# Patient Record
Sex: Male | Born: 1980
Health system: Southern US, Community
[De-identification: ages and names within clinical notes are randomized; demographics above are authoritative.]

## PROBLEM LIST (undated history)

## (undated) DIAGNOSIS — I10 Essential (primary) hypertension: Secondary | ICD-10-CM

## (undated) DIAGNOSIS — F17200 Nicotine dependence, unspecified, uncomplicated: Secondary | ICD-10-CM

## (undated) DIAGNOSIS — J45909 Unspecified asthma, uncomplicated: Secondary | ICD-10-CM

## (undated) DIAGNOSIS — F319 Bipolar disorder, unspecified: Secondary | ICD-10-CM

## (undated) DIAGNOSIS — I779 Disorder of arteries and arterioles, unspecified: Secondary | ICD-10-CM

## (undated) DIAGNOSIS — F209 Schizophrenia, unspecified: Secondary | ICD-10-CM

## (undated) DIAGNOSIS — J449 Chronic obstructive pulmonary disease, unspecified: Secondary | ICD-10-CM

## (undated) DIAGNOSIS — G510 Bell's palsy: Secondary | ICD-10-CM

## (undated) DIAGNOSIS — I739 Peripheral vascular disease, unspecified: Secondary | ICD-10-CM

## (undated) DIAGNOSIS — K589 Irritable bowel syndrome without diarrhea: Secondary | ICD-10-CM

## (undated) HISTORY — PX: ABDOMINAL SURGERY: SHX537

## (undated) HISTORY — PX: CHOLECYSTECTOMY: SHX55

---

## 2006-01-15 ENCOUNTER — Inpatient Hospital Stay (HOSPITAL_COMMUNITY): Admission: EM | Admit: 2006-01-15 | Discharge: 2006-01-16 | Payer: Self-pay | Admitting: *Deleted

## 2006-01-16 ENCOUNTER — Ambulatory Visit: Payer: Self-pay | Admitting: *Deleted

## 2006-02-27 ENCOUNTER — Emergency Department (HOSPITAL_COMMUNITY): Admission: EM | Admit: 2006-02-27 | Discharge: 2006-02-27 | Payer: Self-pay | Admitting: Emergency Medicine

## 2011-02-06 ENCOUNTER — Emergency Department (HOSPITAL_COMMUNITY): Payer: Self-pay

## 2011-02-06 ENCOUNTER — Emergency Department (HOSPITAL_COMMUNITY)
Admission: EM | Admit: 2011-02-06 | Discharge: 2011-02-07 | Disposition: A | Discharge status: Home / Self Care | Payer: Self-pay | Attending: Emergency Medicine | Admitting: Emergency Medicine

## 2011-02-06 DIAGNOSIS — R29810 Facial weakness: Secondary | ICD-10-CM | POA: Insufficient documentation

## 2011-02-06 DIAGNOSIS — I10 Essential (primary) hypertension: Secondary | ICD-10-CM | POA: Insufficient documentation

## 2011-02-06 DIAGNOSIS — R209 Unspecified disturbances of skin sensation: Secondary | ICD-10-CM | POA: Insufficient documentation

## 2011-02-06 DIAGNOSIS — R0789 Other chest pain: Secondary | ICD-10-CM | POA: Insufficient documentation

## 2011-02-06 DIAGNOSIS — K589 Irritable bowel syndrome without diarrhea: Secondary | ICD-10-CM | POA: Insufficient documentation

## 2011-02-06 DIAGNOSIS — J4489 Other specified chronic obstructive pulmonary disease: Secondary | ICD-10-CM | POA: Insufficient documentation

## 2011-02-06 DIAGNOSIS — R4789 Other speech disturbances: Secondary | ICD-10-CM | POA: Insufficient documentation

## 2011-02-06 DIAGNOSIS — R51 Headache: Secondary | ICD-10-CM | POA: Insufficient documentation

## 2011-02-06 DIAGNOSIS — J449 Chronic obstructive pulmonary disease, unspecified: Secondary | ICD-10-CM | POA: Insufficient documentation

## 2011-02-07 ENCOUNTER — Emergency Department (HOSPITAL_COMMUNITY): Payer: Self-pay

## 2011-02-07 ENCOUNTER — Emergency Department (HOSPITAL_COMMUNITY)
Admission: EM | Admit: 2011-02-07 | Discharge: 2011-02-08 | Disposition: A | Discharge status: Home / Self Care | Payer: Self-pay | Attending: Emergency Medicine | Admitting: Emergency Medicine

## 2011-02-07 DIAGNOSIS — Z79899 Other long term (current) drug therapy: Secondary | ICD-10-CM | POA: Insufficient documentation

## 2011-02-07 DIAGNOSIS — F17200 Nicotine dependence, unspecified, uncomplicated: Secondary | ICD-10-CM | POA: Insufficient documentation

## 2011-02-07 DIAGNOSIS — R61 Generalized hyperhidrosis: Secondary | ICD-10-CM | POA: Insufficient documentation

## 2011-02-07 DIAGNOSIS — I10 Essential (primary) hypertension: Secondary | ICD-10-CM | POA: Insufficient documentation

## 2011-02-07 DIAGNOSIS — R079 Chest pain, unspecified: Secondary | ICD-10-CM | POA: Insufficient documentation

## 2011-02-07 DIAGNOSIS — R110 Nausea: Secondary | ICD-10-CM | POA: Insufficient documentation

## 2011-02-07 DIAGNOSIS — R0602 Shortness of breath: Secondary | ICD-10-CM | POA: Insufficient documentation

## 2011-02-07 DIAGNOSIS — R0989 Other specified symptoms and signs involving the circulatory and respiratory systems: Secondary | ICD-10-CM | POA: Insufficient documentation

## 2011-02-07 DIAGNOSIS — R0609 Other forms of dyspnea: Secondary | ICD-10-CM | POA: Insufficient documentation

## 2011-02-07 LAB — CK TOTAL AND CKMB (NOT AT ARMC)
CK, MB: 2.2 ng/mL (ref 0.3–4.0)
CK, MB: 2.1 ng/mL (ref 0.3–4.0)
CK, MB: 2 ng/mL (ref 0.3–4.0)
CK, MB: 1.7 ng/mL (ref 0.3–4.0)
Relative Index: 1 (ref 0.0–2.5)
Relative Index: 1 (ref 0.0–2.5)
Total CK: 149 U/L (ref 7–232)

## 2011-02-07 LAB — CBC
HCT: 43.1 % (ref 39.0–52.0)
HCT: 43 % (ref 39.0–52.0)
Hemoglobin: 15.5 g/dL (ref 13.0–17.0)
MCV: 83.2 fL (ref 78.0–100.0)
Platelets: 256 10*3/uL (ref 150–400)
RBC: 5.18 MIL/uL (ref 4.22–5.81)
RBC: 5.16 MIL/uL (ref 4.22–5.81)
RDW: 12.2 % (ref 11.5–15.5)
RDW: 12.1 % (ref 11.5–15.5)
WBC: 7.9 10*3/uL (ref 4.0–10.5)
WBC: 6.4 10*3/uL (ref 4.0–10.5)

## 2011-02-07 LAB — BASIC METABOLIC PANEL
BUN: 10 mg/dL (ref 6–23)
CO2: 23 mEq/L (ref 19–32)
Calcium: 8.8 mg/dL (ref 8.4–10.5)
Creatinine, Ser: 0.94 mg/dL (ref 0.50–1.35)
GFR calc non Af Amer: >60 mL/min (ref >60)
Glucose, Bld: 107 mg/dL — ABNORMAL HIGH (ref 70–99)
Sodium: 137 mEq/L (ref 135–145)

## 2011-02-07 LAB — DIFFERENTIAL
Basophils Absolute: 0 10*3/uL (ref 0.0–0.1)
Basophils Relative: 1 % (ref 0–1)
Eosinophils Absolute: 0.3 10*3/uL (ref 0.0–0.7)
Lymphocytes Relative: 25 % (ref 12–46)
Lymphocytes Relative: 28 % (ref 12–46)
Lymphs Abs: 1.8 10*3/uL (ref 0.7–4.0)
Monocytes Relative: 7 % (ref 3–12)
Neutro Abs: 4.9 10*3/uL (ref 1.7–7.7)
Neutro Abs: 3.8 10*3/uL (ref 1.7–7.7)
Neutrophils Relative %: 63 % (ref 43–77)
Neutrophils Relative %: 61 % (ref 43–77)

## 2011-02-07 LAB — URINALYSIS, ROUTINE W REFLEX MICROSCOPIC
Hgb urine dipstick: NEGATIVE
Nitrite: NEGATIVE
Specific Gravity, Urine: 1.015 (ref 1.005–1.030)
Urobilinogen, UA: 1 mg/dL (ref 0.0–1.0)
pH: 6.5 (ref 5.0–8.0)

## 2011-02-07 LAB — RAPID URINE DRUG SCREEN, HOSP PERFORMED
Cocaine: NOT DETECTED
Opiates: NOT DETECTED

## 2011-02-07 LAB — TROPONIN I: Troponin I: <0.3 ng/mL (ref <0.30)

## 2011-02-07 LAB — POCT I-STAT, CHEM 8
Chloride: 104 mEq/L (ref 96–112)
HCT: 46 % (ref 39.0–52.0)
Hemoglobin: 15.6 g/dL (ref 13.0–17.0)
Potassium: 3.7 mEq/L (ref 3.5–5.1)
Sodium: 140 mEq/L (ref 135–145)

## 2011-02-07 LAB — D-DIMER, QUANTITATIVE: D-Dimer, Quant: <0.22 ug/mL-FEU (ref 0.00–0.48)

## 2011-02-08 ENCOUNTER — Encounter (HOSPITAL_COMMUNITY): Payer: Self-pay

## 2011-02-08 ENCOUNTER — Emergency Department (HOSPITAL_COMMUNITY)
Admit: 2011-02-08 | Discharge: 2011-02-08 | Disposition: A | Discharge status: Home / Self Care | Payer: Self-pay | Attending: Emergency Medicine | Admitting: Emergency Medicine

## 2011-02-08 MED ORDER — IOHEXOL 350 MG/ML SOLN: 80.0000 mL | Freq: Once | INTRAVENOUS | Status: AC | PRN

## 2012-07-18 ENCOUNTER — Observation Stay (HOSPITAL_COMMUNITY)
Admission: EM | Admit: 2012-07-18 | Discharge: 2012-07-19 | Disposition: A | Discharge status: Home / Self Care | Payer: BC Managed Care – PPO | Attending: Internal Medicine | Admitting: Internal Medicine

## 2012-07-18 ENCOUNTER — Encounter (HOSPITAL_COMMUNITY): Payer: Self-pay | Admitting: *Deleted

## 2012-07-18 ENCOUNTER — Observation Stay (HOSPITAL_COMMUNITY): Payer: BC Managed Care – PPO

## 2012-07-18 ENCOUNTER — Emergency Department (HOSPITAL_COMMUNITY): Payer: BC Managed Care – PPO

## 2012-07-18 DIAGNOSIS — E669 Obesity, unspecified: Secondary | ICD-10-CM | POA: Insufficient documentation

## 2012-07-18 DIAGNOSIS — E860 Dehydration: Secondary | ICD-10-CM

## 2012-07-18 DIAGNOSIS — J111 Influenza due to unidentified influenza virus with other respiratory manifestations: Secondary | ICD-10-CM

## 2012-07-18 DIAGNOSIS — J449 Chronic obstructive pulmonary disease, unspecified: Secondary | ICD-10-CM | POA: Insufficient documentation

## 2012-07-18 DIAGNOSIS — Z720 Tobacco use: Secondary | ICD-10-CM | POA: Diagnosis present

## 2012-07-18 DIAGNOSIS — R042 Hemoptysis: Secondary | ICD-10-CM | POA: Insufficient documentation

## 2012-07-18 DIAGNOSIS — R0602 Shortness of breath: Secondary | ICD-10-CM

## 2012-07-18 DIAGNOSIS — F17200 Nicotine dependence, unspecified, uncomplicated: Secondary | ICD-10-CM | POA: Insufficient documentation

## 2012-07-18 DIAGNOSIS — J4489 Other specified chronic obstructive pulmonary disease: Secondary | ICD-10-CM | POA: Insufficient documentation

## 2012-07-18 DIAGNOSIS — R112 Nausea with vomiting, unspecified: Secondary | ICD-10-CM | POA: Diagnosis present

## 2012-07-18 DIAGNOSIS — J101 Influenza due to other identified influenza virus with other respiratory manifestations: Principal | ICD-10-CM | POA: Insufficient documentation

## 2012-07-18 DIAGNOSIS — Z23 Encounter for immunization: Secondary | ICD-10-CM | POA: Insufficient documentation

## 2012-07-18 DIAGNOSIS — R509 Fever, unspecified: Secondary | ICD-10-CM | POA: Insufficient documentation

## 2012-07-18 DIAGNOSIS — R109 Unspecified abdominal pain: Secondary | ICD-10-CM | POA: Insufficient documentation

## 2012-07-18 HISTORY — DX: Bell's palsy: G51.0

## 2012-07-18 HISTORY — DX: Chronic obstructive pulmonary disease, unspecified: J44.9

## 2012-07-18 LAB — CBC WITH DIFFERENTIAL/PLATELET
Eosinophils Absolute: 0.2 10*3/uL (ref 0.0–0.7)
Hemoglobin: 15.1 g/dL (ref 13.0–17.0)
Lymphocytes Relative: 7 % — ABNORMAL LOW (ref 12–46)
Lymphs Abs: 0.7 10*3/uL (ref 0.7–4.0)
MCH: 29.6 pg (ref 26.0–34.0)
MCV: 85.7 fL (ref 78.0–100.0)
Monocytes Relative: 9 % (ref 3–12)
Neutrophils Relative %: 81 % — ABNORMAL HIGH (ref 43–77)
RBC: 5.1 MIL/uL (ref 4.22–5.81)
WBC: 9 10*3/uL (ref 4.0–10.5)

## 2012-07-18 LAB — COMPREHENSIVE METABOLIC PANEL
ALT: 38 U/L (ref 0–53)
Alkaline Phosphatase: 116 U/L (ref 39–117)
BUN: 10 mg/dL (ref 6–23)
CO2: 23 mEq/L (ref 19–32)
GFR calc Af Amer: >90 mL/min (ref >90)
GFR calc non Af Amer: 88 mL/min — ABNORMAL LOW (ref >90)
Glucose, Bld: 98 mg/dL (ref 70–99)
Potassium: 4.2 mEq/L (ref 3.5–5.1)
Total Bilirubin: 0.6 mg/dL (ref 0.3–1.2)
Total Protein: 7.2 g/dL (ref 6.0–8.3)

## 2012-07-18 LAB — RAPID URINE DRUG SCREEN, HOSP PERFORMED
Amphetamines: NOT DETECTED
Benzodiazepines: NOT DETECTED
Cocaine: NOT DETECTED
Opiates: NOT DETECTED

## 2012-07-18 LAB — URINALYSIS, ROUTINE W REFLEX MICROSCOPIC
Ketones, ur: NEGATIVE mg/dL
Nitrite: NEGATIVE
Protein, ur: NEGATIVE mg/dL
Urobilinogen, UA: 4 mg/dL — ABNORMAL HIGH (ref 0.0–1.0)
pH: 6.5 (ref 5.0–8.0)

## 2012-07-18 LAB — URINE MICROSCOPIC-ADD ON

## 2012-07-18 MED ORDER — ACETAMINOPHEN 325 MG PO TABS
650.0000 mg | ORAL_TABLET | ORAL | Status: DC | PRN
  Administered 2012-07-18: 650 mg via ORAL
  Filled 2012-07-18: qty 2

## 2012-07-18 MED ORDER — ONDANSETRON HCL 4 MG/2ML IJ SOLN
4.0000 mg | Freq: Once | INTRAMUSCULAR | Status: AC
  Administered 2012-07-18: 4 mg via INTRAVENOUS
  Filled 2012-07-18: qty 2

## 2012-07-18 MED ORDER — IPRATROPIUM BROMIDE 0.02 % IN SOLN: 0.5000 mg | RESPIRATORY_TRACT | Status: DC | PRN

## 2012-07-18 MED ORDER — SODIUM CHLORIDE 0.9 % IV SOLN
INTRAVENOUS | Status: AC
  Administered 2012-07-18: 21:00:00 via INTRAVENOUS

## 2012-07-18 MED ORDER — ALBUTEROL SULFATE (5 MG/ML) 0.5% IN NEBU
5.0000 mg | INHALATION_SOLUTION | RESPIRATORY_TRACT | Status: AC | PRN
  Administered 2012-07-18 – 2012-07-19 (×2): 5 mg via RESPIRATORY_TRACT
  Filled 2012-07-18 (×2): qty 0.5
  Filled 2012-07-18: qty 1

## 2012-07-18 MED ORDER — SODIUM CHLORIDE 0.9 % IV BOLUS (SEPSIS)
1000.0000 mL | Freq: Once | INTRAVENOUS | Status: AC
  Administered 2012-07-18: 1000 mL via INTRAVENOUS

## 2012-07-18 MED ORDER — CITALOPRAM HYDROBROMIDE 20 MG PO TABS
20.0000 mg | ORAL_TABLET | Freq: Every day | ORAL | Status: DC
  Administered 2012-07-18 – 2012-07-19 (×2): 20 mg via ORAL
  Filled 2012-07-18 (×2): qty 1

## 2012-07-18 MED ORDER — HYDROCODONE-ACETAMINOPHEN 5-325 MG PO TABS
1.0000 | ORAL_TABLET | ORAL | Status: DC | PRN
  Administered 2012-07-18: 1 via ORAL
  Filled 2012-07-18: qty 1

## 2012-07-18 MED ORDER — SODIUM CHLORIDE 0.9 % IV BOLUS (SEPSIS)
1000.0000 mL | Freq: Once | INTRAVENOUS | Status: AC
  Administered 2012-07-18 (×2): 1000 mL via INTRAVENOUS

## 2012-07-18 MED ORDER — KETOROLAC TROMETHAMINE 15 MG/ML IJ SOLN
15.0000 mg | Freq: Once | INTRAMUSCULAR | Status: DC
  Filled 2012-07-18 (×2): qty 1

## 2012-07-18 MED ORDER — ALBUTEROL SULFATE (5 MG/ML) 0.5% IN NEBU
5.0000 mg | INHALATION_SOLUTION | Freq: Once | RESPIRATORY_TRACT | Status: AC
  Administered 2012-07-18: 5 mg via RESPIRATORY_TRACT
  Filled 2012-07-18: qty 40

## 2012-07-18 MED ORDER — LORATADINE 10 MG PO TABS
10.0000 mg | ORAL_TABLET | Freq: Every day | ORAL | Status: DC
  Administered 2012-07-19: 10 mg via ORAL
  Filled 2012-07-18: qty 1

## 2012-07-18 MED ORDER — ENOXAPARIN SODIUM 30 MG/0.3ML ~~LOC~~ SOLN
30.0000 mg | SUBCUTANEOUS | Status: DC
  Administered 2012-07-18: 30 mg via SUBCUTANEOUS
  Filled 2012-07-18 (×2): qty 0.3

## 2012-07-18 MED ORDER — METHYLPREDNISOLONE SODIUM SUCC 125 MG IJ SOLR
125.0000 mg | Freq: Once | INTRAMUSCULAR | Status: AC
  Administered 2012-07-18: 125 mg via INTRAVENOUS
  Filled 2012-07-18: qty 2

## 2012-07-18 MED ORDER — IPRATROPIUM BROMIDE 0.02 % IN SOLN
0.5000 mg | Freq: Once | RESPIRATORY_TRACT | Status: AC
  Administered 2012-07-18: 0.5 mg via RESPIRATORY_TRACT
  Filled 2012-07-18: qty 2.5

## 2012-07-18 MED ORDER — LISINOPRIL 40 MG PO TABS
40.0000 mg | ORAL_TABLET | Freq: Every day | ORAL | Status: DC
  Administered 2012-07-18 – 2012-07-19 (×2): 40 mg via ORAL
  Filled 2012-07-18 (×2): qty 1

## 2012-07-18 MED ORDER — ONDANSETRON HCL 4 MG PO TABS: 4.0000 mg | ORAL_TABLET | Freq: Four times a day (QID) | ORAL | Status: DC | PRN

## 2012-07-18 MED ORDER — ONDANSETRON HCL 4 MG/2ML IJ SOLN: 4.0000 mg | Freq: Four times a day (QID) | INTRAMUSCULAR | Status: DC | PRN

## 2012-07-18 MED ORDER — METHYLPREDNISOLONE SODIUM SUCC 125 MG IJ SOLR
125.0000 mg | Freq: Two times a day (BID) | INTRAMUSCULAR | Status: DC
  Administered 2012-07-19: 125 mg via INTRAVENOUS
  Filled 2012-07-18 (×3): qty 2

## 2012-07-18 MED ORDER — ONDANSETRON HCL 4 MG/2ML IJ SOLN: 4.0000 mg | Freq: Three times a day (TID) | INTRAMUSCULAR | Status: DC | PRN

## 2012-07-18 MED ORDER — HYDROCOD POLST-CHLORPHEN POLST 10-8 MG/5ML PO LQCR
5.0000 mL | Freq: Two times a day (BID) | ORAL | Status: DC | PRN
  Administered 2012-07-18 – 2012-07-19 (×2): 5 mL via ORAL
  Filled 2012-07-18 (×2): qty 5

## 2012-07-18 MED ORDER — ALBUTEROL SULFATE (5 MG/ML) 0.5% IN NEBU
5.0000 mg | INHALATION_SOLUTION | Freq: Once | RESPIRATORY_TRACT | Status: AC
  Administered 2012-07-18: 5 mg via RESPIRATORY_TRACT
  Filled 2012-07-18 (×2): qty 40

## 2012-07-18 MED ORDER — KETOROLAC TROMETHAMINE 30 MG/ML IJ SOLN
INTRAMUSCULAR | Status: AC
  Administered 2012-07-18: 15 mg
  Filled 2012-07-18: qty 1

## 2012-07-18 NOTE — ED Notes (Signed)
Family at bedside. 

## 2012-07-18 NOTE — ED Provider Notes (Signed)
Medical screening examination/treatment/procedure(s) were conducted as a shared visit with non-physician practitioner(s) and myself.  I personally evaluated the patient during the encounter.  One-week flulike illness with fever body aches cough and diffuse wheezing, on examination the patient is diffusely wheezing and is still tachycardic but normotensive despite IV fluid hydration attempts in the ED, unassigned medicine has been paged for admission.  Hurman Horn, MD 07/19/12 2234

## 2012-07-18 NOTE — ED Notes (Signed)
Pt is here with right flank pain that comes in waves and patient reports some right lower abdominal pain.  Pt has been vomiting.  Symptoms started yesterday.  Pt has pain taking a deep breath.  Pt reports coughing up yellow sputum.  Fever at triage

## 2012-07-18 NOTE — H&P (Signed)
[facility] History and Physical  Nathaniel [name] WUJ:[id number] DOB: 1981-01-03 DOA: 07/18/2012  Referring physician: ED physician PCP: Alinda Deem, MD   Chief Complaint: Shortness of breath   HPI:  Pt is 32 yo male who presents with progressively worsening shortness of breath that initially started several days prior to admission and has been associated with productive cough of yellow sputum, fevers, chills, nausea and vomiting, abdominal pain. He denies similar events in the past. He denies recent sick contacts or exposures, no chest pain or palpitations.   In ED, pt wheezing and with tachycardia, vomiting.  Assessment and Plan:  Shortness of breath - likely secondary to viral illness - will admit the pt to medical floor for further evaluation  - will provide supportive care with IVF, analgesia and antiemetics as needed - provide nebulizers, oxygen as needed - check UDS, Influenza panel - solumedrol as pt is still wheezing and planned tapering   Code Status: Full Family Communication: Pt at bedside Disposition Plan: Admit to medical floor  Review of Systems:  Constitutional: Positive  for fever, chills and malaise/fatigue. Negative for diaphoresis.  HENT: Negative for hearing loss, ear pain, nosebleeds, congestion, sore throat, neck pain, tinnitus and ear discharge.   Eyes: Negative for blurred vision, double vision, photophobia, pain, discharge and redness.  Respiratory: Positive for cough, hemoptysis, sputum production, shortness of breath, wheezing and stridor.   Cardiovascular: Negative for chest pain, palpitations, orthopnea, claudication and leg swelling.  Gastrointestinal: Positive for nausea, vomiting and abdominal pain. Negative for heartburn, constipation, blood in stool and melena.  Genitourinary: Negative for dysuria, urgency, frequency, hematuria and flank pain.  Musculoskeletal: Negative for myalgias, back pain, joint pain and falls.  Skin: Negative for  itching and rash.  Neurological: Negative for dizziness and weakness. Negative for tingling, tremors, sensory change, speech change, focal weakness, loss of consciousness and headaches.  Endo/Heme/Allergies: Negative for environmental allergies and polydipsia. Does not bruise/bleed easily.  Psychiatric/Behavioral: Negative for suicidal ideas. The patient is not nervous/anxious.      Past Medical History  Diagnosis Date  . COPD (chronic obstructive pulmonary disease)   . Bell's palsy     Past Surgical History  Procedure Date  . Cholecystectomy   . Abdominal surgery     Social History:  reports that he has been smoking.  He does not have any smokeless tobacco history on file. He reports that he does not drink alcohol or use illicit drugs.  Allergies  Allergen Reactions  . Flagyl (Metronidazole)   . Latex   . Penicillins   . Phenergan (Promethazine Hcl)   . Vancomycin     No family history on file.  Prior to Admission medications   Medication Sig Start Date End Date Taking? Authorizing Provider  citalopram (CELEXA) 20 MG tablet Take 20 mg by mouth daily.   Yes Historical Provider, MD  lisinopril (PRINIVIL,ZESTRIL) 40 MG tablet Take 40 mg by mouth daily.   Yes Historical Provider, MD  Loratadine-Pseudoephedrine (CLARITIN-D 24 HOUR PO) Take 1 tablet by mouth daily.   Yes Historical Provider, MD    Physical Exam: Filed Vitals:   07/18/12 1433 07/18/12 1614 07/18/12 1617 07/18/12 1625  BP: 117/72 135/65 142/83 139/69  Pulse: 107 118 120 126  Temp: 99.2 F (37.3 C)     TempSrc: Oral     Resp: 25     SpO2: 92%       Physical Exam  Constitutional: Appears well-developed and well-nourished. No distress.  HENT: Normocephalic. External right and  left ear normal. Oropharynx is clear and moist.  Eyes: Conjunctivae and EOM are normal. PERRLA, no scleral icterus.  Neck: Normal ROM. Neck supple. No JVD. No tracheal deviation. No thyromegaly.  CVS: Regular rhythm, tachycardic,  S1/S2 +, no murmurs, no gallops, no carotid bruit.  Pulmonary: Effort and breath sounds normal, expiratory wheezing and mild distress due to shortness of breath, no accessory muscles used Abdominal: Soft. BS +,  no distension, tenderness, rebound or guarding.  Musculoskeletal: Normal range of motion. No edema and no tenderness.  Lymphadenopathy: No lymphadenopathy noted, cervical, inguinal. Neuro: Alert. Normal reflexes, muscle tone coordination. No cranial nerve deficit. Skin: Skin is warm and dry. No rash noted. Not diaphoretic. No erythema. No pallor.  Psychiatric: Normal mood and affect. Behavior, judgment, thought content normal.   Labs on Admission:  Basic Metabolic Panel:  Lab 07/18/12 1610  NA 131*  K 4.2  CL 96  CO2 23  GLUCOSE 98  BUN 10  CREATININE 1.11  CALCIUM 9.3  MG --  PHOS --   Liver Function Tests:  Lab 07/18/12 1140  AST 36  ALT 38  ALKPHOS 116  BILITOT 0.6  PROT 7.2  ALBUMIN 3.8   No results found for this basename: LIPASE:5,AMYLASE:5 in the last 168 hours No results found for this basename: AMMONIA:5 in the last 168 hours CBC:  Lab 07/18/12 1140  WBC 9.0  NEUTROABS 7.4  HGB 15.1  HCT 43.7  MCV 85.7  PLT 250   Cardiac Enzymes: No results found for this basename: CKTOTAL:5,CKMB:5,CKMBINDEX:5,TROPONINI:5 in the last 168 hours BNP: No components found with this basename: POCBNP:5 CBG: No results found for this basename: GLUCAP:5 in the last 168 hours  Radiological Exams on Admission: Dg Chest 2 View  07/18/2012  *RADIOLOGY REPORT*  Clinical Data: Flank pain.  Nausea, vomiting.  Cough for 1 week. Migraines.  Right flank pain.  History of smoking and hypertension.  CHEST - 2 VIEW  Comparison: Chest x-ray 02/07/2011  Findings: Cardiomediastinal silhouette is within normal limits. The lungs are free of focal consolidations and pleural effusions. No pulmonary edema.  Surgical clips are identified in the right upper quadrant of the abdomen.   Degenerative changes are seen in the spine.  IMPRESSION: No evidence for acute cardiopulmonary abnormality.   Original Report Authenticated By: Norva Pavlov, M.D.     EKG: Normal sinus rhythm, no ST/T wave changes  Debbora Presto, MD  [facility] Pager [phone]  If 7PM-7AM, please contact night-coverage [email or web address] Password [email or web address] 07/18/2012, 5:00 PM

## 2012-07-18 NOTE — ED Provider Notes (Signed)
History     CSN: [id number]  Arrival date & time 07/18/12  1133   First MD Initiated Contact with Patient 07/18/12 1151      Chief Complaint  Patient presents with  . Flank Pain    (Consider location/radiation/quality/duration/timing/severity/associated sxs/prior treatment) HPI Comments: Patient presents with one week of flulike symptoms including productive cough, headache, subjective fever, persistent nausea and vomiting. Cough is productive of yellow sputum. Patient has noted streaks of blood in vomit. He has lateral right chest wall pain with coughing. Of note, normal coronary CT performed 01/2011. Patient has had chest tightness with wheezing, shortness of breath. No chest pain otherwise. Symptoms have worsened over the past 2 days. No ear pain or nasal congestion. Patient has had sore throat at times. No urinary symptoms or skin rashes.  The history is provided by the patient.    Past Medical History  Diagnosis Date  . COPD (chronic obstructive pulmonary disease)   . Bell's palsy     Past Surgical History  Procedure Date  . Cholecystectomy   . Abdominal surgery     No family history on file.  History  Substance Use Topics  . Smoking status: Current Every Day Smoker  . Smokeless tobacco: Not on file  . Alcohol Use: No      Review of Systems  All other systems reviewed and are negative.    Allergies  Flagyl; Latex; Penicillins; Phenergan; and Vancomycin  Home Medications  No current outpatient prescriptions on file.  BP 142/88  Pulse 126  Temp 99.3 F (37.4 C) (Oral)  Resp 25  SpO2 97%  Physical Exam  Nursing note and vitals reviewed. Constitutional: He appears well-developed and well-nourished.  HENT:  Head: Normocephalic and atraumatic. No trismus in the jaw.  Right Ear: Tympanic membrane, external ear and ear canal normal.  Left Ear: Tympanic membrane, external ear and ear canal normal.  Nose: Nose normal. No mucosal edema or rhinorrhea.    Mouth/Throat: Uvula is midline and mucous membranes are normal. Mucous membranes are not dry. No uvula swelling. Posterior oropharyngeal erythema present. No oropharyngeal exudate, posterior oropharyngeal edema or tonsillar abscesses.  Eyes: Conjunctivae normal are normal. Right eye exhibits no discharge. Left eye exhibits no discharge.  Neck: Normal range of motion. Neck supple.  Cardiovascular: Regular rhythm and normal heart sounds.  Tachycardia present.   Pulmonary/Chest: Tachypnea (mild, speaks in full sentences) noted. No respiratory distress. He has wheezes (mild, bases). He has no rales.  Abdominal: Soft. He exhibits no distension. There is no tenderness. There is no rebound and no guarding.  Musculoskeletal: He exhibits no edema (no lower extremity edema).  Lymphadenopathy:    He has no cervical adenopathy.  Neurological: He is alert.  Skin: Skin is warm and dry.  Psychiatric: He has a normal mood and affect.    ED Course  Procedures (including critical care time)  Labs Reviewed  CBC WITH DIFFERENTIAL - Abnormal; Notable for the following:    Neutrophils Relative 81 (*)     Lymphocytes Relative 7 (*)     All other components within normal limits  COMPREHENSIVE METABOLIC PANEL - Abnormal; Notable for the following:    Sodium 131 (*)     GFR calc non Af Amer 88 (*)     All other components within normal limits  URINALYSIS, ROUTINE W REFLEX MICROSCOPIC  INFLUENZA PANEL BY PCR   Dg Chest 2 View  07/18/2012  *RADIOLOGY REPORT*  Clinical Data: Flank pain.  Nausea, vomiting.  Cough for 1 week. Migraines.  Right flank pain.  History of smoking and hypertension.  CHEST - 2 VIEW  Comparison: Chest x-ray 02/07/2011  Findings: Cardiomediastinal silhouette is within normal limits. The lungs are free of focal consolidations and pleural effusions. No pulmonary edema.  Surgical clips are identified in the right upper quadrant of the abdomen.  Degenerative changes are seen in the spine.   IMPRESSION: No evidence for acute cardiopulmonary abnormality.   Original Report Authenticated By: Norva Pavlov, M.D.      1. Influenza-like illness   2. Shortness of breath     12:00 PM Patient in x-ray.   Vital signs reviewed and are as follows: Filed Vitals:   07/18/12 1145  BP: 142/88  Pulse: 126  Temp: 99.3 F (37.4 C)  Resp: 25   12:16 PM Patient seen and examined. Labs, medicines ordered. CXR reviewed by myself.   3:13 PM Patient continues to be tachycardic, but less so after one liter. He has failed PO challenge, currently vomiting. D/w Dr. Radford Pax and CT maxillofacial ordered to look for sinusitis. Also 2 L additional NS, zofran, influenza PCR. Has not yet been able to urinate. May need admission for dehydration.   4:19 PM Handoff to Dr. Fonnie Jarvis.   Plan: POC lactate, CT maxillofacial pending. Dr. Fonnie Jarvis will re-eval.   BP 142/83  Pulse 120  Temp 99.2 F (37.3 C) (Oral)  Resp 25  SpO2 92%  4:36 PM Seen with Dr. Fonnie Jarvis. Will admit.   MDM  Pending completion of work-up. Influenza vs bronchitis vs sinusitis, dehydration.         Renne Crigler, [place] 07/18/12 1621  Renne Crigler, [place] 07/18/12 [phone]

## 2012-07-18 NOTE — ED Provider Notes (Signed)
Medical screening examination/treatment/procedure(s) were conducted as a shared visit with non-physician practitioner(s) and myself.  I personally evaluated the patient during the encounter    Nelia Shi, MD 07/18/12 2227

## 2012-07-18 NOTE — ED Notes (Signed)
Pt presents with n/v X3 days.  Pt had fever 101.2 in triage.  Pt breathing 25/min.  Pt heart rate 122.  Pt has constant cough and wife reports he has chills and flu like symptoms X1 week. Pt states she has pain in rt flank and abdomin that increases with coughing.  Pt reports he has COPD.   Pt alert oriented X4

## 2012-07-19 DIAGNOSIS — Z720 Tobacco use: Secondary | ICD-10-CM | POA: Diagnosis present

## 2012-07-19 DIAGNOSIS — F17200 Nicotine dependence, unspecified, uncomplicated: Secondary | ICD-10-CM

## 2012-07-19 DIAGNOSIS — R112 Nausea with vomiting, unspecified: Secondary | ICD-10-CM

## 2012-07-19 DIAGNOSIS — J101 Influenza due to other identified influenza virus with other respiratory manifestations: Secondary | ICD-10-CM | POA: Diagnosis present

## 2012-07-19 LAB — BASIC METABOLIC PANEL
CO2: 24 mEq/L (ref 19–32)
Calcium: 8.3 mg/dL — ABNORMAL LOW (ref 8.4–10.5)
Creatinine, Ser: 0.96 mg/dL (ref 0.50–1.35)
GFR calc Af Amer: >90 mL/min (ref >90)
GFR calc non Af Amer: >90 mL/min (ref >90)
Sodium: 141 mEq/L (ref 135–145)

## 2012-07-19 LAB — CBC
Platelets: 194 10*3/uL (ref 150–400)
RBC: 4.43 MIL/uL (ref 4.22–5.81)
RDW: 12.6 % (ref 11.5–15.5)
WBC: 8.3 10*3/uL (ref 4.0–10.5)

## 2012-07-19 MED ORDER — PNEUMOCOCCAL VAC POLYVALENT 25 MCG/0.5ML IJ INJ
0.5000 mL | INJECTION | Freq: Once | INTRAMUSCULAR | Status: DC
  Filled 2012-07-19 (×2): qty 0.5

## 2012-07-19 MED ORDER — INFLUENZA VIRUS VACC SPLIT PF IM SUSP
0.5000 mL | Freq: Once | INTRAMUSCULAR | Status: DC
  Filled 2012-07-19 (×2): qty 0.5

## 2012-07-19 MED ORDER — PNEUMOCOCCAL VAC POLYVALENT 25 MCG/0.5ML IJ INJ: 0.5000 mL | INJECTION | INTRAMUSCULAR | Status: DC

## 2012-07-19 MED ORDER — OSELTAMIVIR PHOSPHATE 75 MG PO CAPS
75.0000 mg | ORAL_CAPSULE | Freq: Two times a day (BID) | ORAL | Status: DC
  Administered 2012-07-19: 75 mg via ORAL
  Filled 2012-07-19 (×2): qty 1

## 2012-07-19 MED ORDER — INFLUENZA VIRUS VACC SPLIT PF IM SUSP
0.5000 mL | Freq: Once | INTRAMUSCULAR | Status: AC
  Administered 2012-07-19: 0.5 mL via INTRAMUSCULAR
  Filled 2012-07-19: qty 0.5

## 2012-07-19 MED ORDER — OSELTAMIVIR PHOSPHATE 75 MG PO CAPS: 75.0000 mg | ORAL_CAPSULE | Freq: Two times a day (BID) | ORAL | Status: AC

## 2012-07-19 MED ORDER — PNEUMOCOCCAL VAC POLYVALENT 25 MCG/0.5ML IJ INJ
0.5000 mL | INJECTION | INTRAMUSCULAR | Status: AC
  Administered 2012-07-19: 0.5 mL via INTRAMUSCULAR

## 2012-07-19 MED ORDER — INFLUENZA VIRUS VACC SPLIT PF IM SUSP: 0.5000 mL | INTRAMUSCULAR | Status: DC

## 2012-07-19 NOTE — Progress Notes (Signed)
Pt doing well. Pt given Flu & PNA vaccine prior to D/C. Pt given D/C instructions with verbal understanding. PT D/C'd home via walking per MD order. Rema Fendt, RN

## 2012-07-19 NOTE — Care Management Note (Signed)
    Page 1 of 1   07/19/2012     4:10:07 PM   CARE MANAGEMENT NOTE 07/19/2012  Patient:  Nathaniel [name], Nathaniel [name]   Account Number:  [id number]  Date Initiated:  07/19/2012  Documentation initiated by:  Jacquelynn Cree  Subjective/Objective Assessment:   Admitted with flu, bronchitis     Action/Plan:   Anticipated DC Date:  07/19/2012   Anticipated DC Plan:  HOME/SELF CARE      DC Planning Services  CM consult      Choice offered to / List presented to:             Status of service:  Completed, signed off Medicare Important Message given?   (If response is "NO", the following Medicare IM given date fields will be blank) Date Medicare IM given:   Date Additional Medicare IM given:    Discharge Disposition:  HOME/SELF CARE  Per UR Regulation:  Reviewed for med. necessity/level of care/duration of stay  If discussed at Long Length of Stay Meetings, dates discussed:    Comments:

## 2012-07-19 NOTE — Discharge Summary (Signed)
Physician Discharge Summary  Nathaniel [name] ZOX:[id number] DOB: 1980/09/11 DOA: 07/18/2012  PCP: Alinda Deem, MD  Admit date: 07/18/2012 Discharge date: 07/19/2012  Time spent: 25 minutes  Recommendations for Outpatient Follow-up:  1. Patient will follow primary care physician in the next month  Discharge Diagnoses:  Principal Problem:  *Influenza A (H1N1) Active Problems:  Tobacco abuse  Nausea & vomiting   Discharge Condition: Improved, being discharged home  Diet recommendation: Regular  History of present illness:  32 year old white male past history of tobacco abuse and mild obesity plus hypertension who presented to the several days of heavy aggressive coughing. Patient also reported that he was going to blood past week intermittently. He described the vomitus as her blood. He came into the emergency room, he was noted to have a normal white blood cell count but did have documented fevers as high as 102 he said he occasionally coughs up yellowish green sputum. Chest x-ray was unremarkable the patient was admitted for flulike illness. Flu PCR was sent.  Hospital Course:  Influenza: Patient's PCR for flu was positive for age 32 and 1. Patient was started on a flu. Hospital day 2 he does much better. 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  In addition, his family is advised or mass the next 2 days. Also been advised to get a flu shot-meter his wife nor his mother had got them yet.  Tobacco abuse: Patient is not to smoke. At the very least, she does return back to smoking, not to be so until his symptoms have resolved. He says he will do this. His wife continues to encourage him to quit smoking.  Nausea and vomiting: Patient reportedly did history of vomiting blood. However given the fact that this is often bright red, suspected to forceful coughing his height mild hemoptysis this was an  occasional dryfor CHP in swallowing which led to upset stomach which led to vomiting up "blood".    Procedures:  None  Consultations: None  Discharge Exam: Filed Vitals:   07/18/12 1625 07/18/12 1700 07/18/12 1800 07/18/12 2300  BP: 139/69 117/54 119/60 115/60  Pulse: 126 137  105  Temp:    98.2 F (36.8 C)  TempSrc:    Oral  Resp:   24 22  SpO2:  100% 100% 95%    General: Alert and oriented x3, no acute distress Cardiovascular: Regular rate and rhythm, S1-S2 Respiratory: Clear to auscultation bilaterally Abdomen: Soft, nontender, nondistended positive bowel sounds Extremities: No clubbing or cyanosis or edema  Discharge Instructions  Discharge Orders    Future Orders Please Complete By Expires   Diet - low sodium heart healthy      Increase activity slowly      Other Restrictions      Comments:   No smoking  Do not return to work for next 5 days  Family needs to wear masks around you for the next 2 days       Medication List     As of 07/19/2012  3:46 PM    STOP taking these medications         CLARITIN-D 24 HOUR PO      TAKE these medications         citalopram 20 MG tablet   Commonly known as: CELEXA   Take 20 mg by mouth daily.      lisinopril 40 MG tablet   Commonly  known as: PRINIVIL,ZESTRIL   Take 40 mg by mouth daily.      oseltamivir 75 MG capsule   Commonly known as: TAMIFLU   Take 1 capsule (75 mg total) by mouth 2 (two) times daily.           Follow-up Information    Follow up with [facility], MD. In 1 month.   Contact information:                           eet           The results of significant diagnostics from this hospitalization (including imaging, microbiology, ancillary and laboratory) are listed below for reference.    Significant Diagnostic Studies: Dg Chest 2 View  07/18/2012 IMPRESSION: No evidence for acute cardiopulmonary abnormality.   Original Report Authenticated By: Norva Pavlov, M.D.    Ct  Maxillofacial Wo Cm  07/18/2012    IMPRESSION:  1.  Mild left maxillary, sphenoid sinus and ethmoid air cell disease. 2.  No acute findings.   Original Report Authenticated By: Signa Kell, M.D.     Labs: Basic Metabolic Panel:  Lab 07/19/12 1610 07/18/12 1140  NA 141 131*  K 4.1 4.2  CL 106 96  CO2 24 23  GLUCOSE 163* 98  BUN 14 10  CREATININE 0.96 1.11  CALCIUM 8.3* 9.3  MG -- --  PHOS -- --   Liver Function Tests:  Lab 07/18/12 1140  AST 36  ALT 38  ALKPHOS 116  BILITOT 0.6  PROT 7.2  ALBUMIN 3.8   CBC:  Lab 07/19/12 0803 07/18/12 1140  WBC 8.3 9.0  NEUTROABS -- 7.4  HGB 13.3 15.1  HCT 38.2* 43.7  MCV 86.2 85.7  PLT 194 250     Signed:  Jamecia Lerman K  [facility] 07/19/2012, 3:46 PM

## 2013-02-05 ENCOUNTER — Inpatient Hospital Stay (HOSPITAL_COMMUNITY): Payer: BC Managed Care – PPO

## 2013-02-05 ENCOUNTER — Encounter (HOSPITAL_COMMUNITY): Payer: Self-pay | Admitting: Anesthesiology

## 2013-02-05 ENCOUNTER — Inpatient Hospital Stay (HOSPITAL_COMMUNITY): Payer: BC Managed Care – PPO | Admitting: Anesthesiology

## 2013-02-05 ENCOUNTER — Inpatient Hospital Stay (HOSPITAL_COMMUNITY)
Admission: AD | Admit: 2013-02-05 | Discharge: 2013-02-08 | DRG: 486 | Disposition: A | Discharge status: Home / Self Care | Payer: BC Managed Care – PPO | Source: Other Acute Inpatient Hospital | Attending: Internal Medicine | Admitting: Internal Medicine

## 2013-02-05 ENCOUNTER — Encounter (HOSPITAL_COMMUNITY): Payer: Self-pay | Admitting: Internal Medicine

## 2013-02-05 ENCOUNTER — Encounter (HOSPITAL_COMMUNITY)
Admission: AD | Disposition: A | Discharge status: Home / Self Care | Payer: Self-pay | Source: Other Acute Inpatient Hospital | Attending: Internal Medicine

## 2013-02-05 DIAGNOSIS — A4901 Methicillin susceptible Staphylococcus aureus infection, unspecified site: Secondary | ICD-10-CM | POA: Diagnosis present

## 2013-02-05 DIAGNOSIS — Z720 Tobacco use: Secondary | ICD-10-CM

## 2013-02-05 DIAGNOSIS — F3289 Other specified depressive episodes: Secondary | ICD-10-CM | POA: Diagnosis present

## 2013-02-05 DIAGNOSIS — I10 Essential (primary) hypertension: Secondary | ICD-10-CM | POA: Diagnosis present

## 2013-02-05 DIAGNOSIS — L03116 Cellulitis of left lower limb: Secondary | ICD-10-CM

## 2013-02-05 DIAGNOSIS — F329 Major depressive disorder, single episode, unspecified: Secondary | ICD-10-CM | POA: Diagnosis present

## 2013-02-05 DIAGNOSIS — L03119 Cellulitis of unspecified part of limb: Secondary | ICD-10-CM | POA: Diagnosis present

## 2013-02-05 DIAGNOSIS — M009 Pyogenic arthritis, unspecified: Principal | ICD-10-CM | POA: Diagnosis present

## 2013-02-05 DIAGNOSIS — L02419 Cutaneous abscess of limb, unspecified: Secondary | ICD-10-CM | POA: Diagnosis present

## 2013-02-05 DIAGNOSIS — R739 Hyperglycemia, unspecified: Secondary | ICD-10-CM | POA: Diagnosis present

## 2013-02-05 DIAGNOSIS — Z880 Allergy status to penicillin: Secondary | ICD-10-CM

## 2013-02-05 DIAGNOSIS — E669 Obesity, unspecified: Secondary | ICD-10-CM | POA: Diagnosis present

## 2013-02-05 DIAGNOSIS — F17200 Nicotine dependence, unspecified, uncomplicated: Secondary | ICD-10-CM

## 2013-02-05 DIAGNOSIS — R7309 Other abnormal glucose: Secondary | ICD-10-CM | POA: Diagnosis present

## 2013-02-05 DIAGNOSIS — R112 Nausea with vomiting, unspecified: Secondary | ICD-10-CM

## 2013-02-05 DIAGNOSIS — M71162 Other infective bursitis, left knee: Secondary | ICD-10-CM | POA: Diagnosis present

## 2013-02-05 DIAGNOSIS — Z881 Allergy status to other antibiotic agents status: Secondary | ICD-10-CM

## 2013-02-05 DIAGNOSIS — J441 Chronic obstructive pulmonary disease with (acute) exacerbation: Secondary | ICD-10-CM | POA: Diagnosis present

## 2013-02-05 HISTORY — DX: Essential (primary) hypertension: I10

## 2013-02-05 HISTORY — PX: KNEE ARTHROSCOPY WITH MEDIAL MENISECTOMY: SHX5651

## 2013-02-05 HISTORY — DX: Irritable bowel syndrome, unspecified: K58.9

## 2013-02-05 HISTORY — PX: INCISION AND DRAINAGE: SHX5863

## 2013-02-05 HISTORY — PX: IRRIGATION AND DEBRIDEMENT KNEE: SHX5185

## 2013-02-05 LAB — CBC
HCT: 39.9 % (ref 39.0–52.0)
Hemoglobin: 13.5 g/dL (ref 13.0–17.0)
MCHC: 33.8 g/dL (ref 30.0–36.0)

## 2013-02-05 LAB — GRAM STAIN

## 2013-02-05 LAB — BLOOD GAS, ARTERIAL
Bicarbonate: 24.7 mEq/L — ABNORMAL HIGH (ref 20.0–24.0)
Patient temperature: 98.6
TCO2: 26.1 mmol/L (ref 0–100)
pCO2 arterial: 45.7 mmHg — ABNORMAL HIGH (ref 35.0–45.0)
pH, Arterial: 7.352 (ref 7.350–7.450)
pO2, Arterial: 76.5 mmHg — ABNORMAL LOW (ref 80.0–100.0)

## 2013-02-05 LAB — BASIC METABOLIC PANEL
BUN: 8 mg/dL (ref 6–23)
Chloride: 97 mEq/L (ref 96–112)
GFR calc non Af Amer: >90 mL/min (ref >90)
Glucose, Bld: 215 mg/dL — ABNORMAL HIGH (ref 70–99)
Potassium: 3.8 mEq/L (ref 3.5–5.1)

## 2013-02-05 SURGERY — IRRIGATION AND DEBRIDEMENT KNEE
Anesthesia: General | Site: Knee | Laterality: Left | Wound class: Dirty or Infected

## 2013-02-05 MED ORDER — FENTANYL CITRATE 0.05 MG/ML IJ SOLN
INTRAMUSCULAR | Status: DC | PRN
  Administered 2013-02-05 (×2): 100 ug via INTRAVENOUS

## 2013-02-05 MED ORDER — HYDROMORPHONE HCL PF 1 MG/ML IJ SOLN
INTRAMUSCULAR | Status: AC
  Filled 2013-02-05: qty 1

## 2013-02-05 MED ORDER — CIPROFLOXACIN IN D5W 400 MG/200ML IV SOLN
400.0000 mg | Freq: Two times a day (BID) | INTRAVENOUS | Status: DC
  Administered 2013-02-05: 400 mg via INTRAVENOUS
  Filled 2013-02-05 (×2): qty 200

## 2013-02-05 MED ORDER — BUDESONIDE 0.25 MG/2ML IN SUSP
0.2500 mg | Freq: Two times a day (BID) | RESPIRATORY_TRACT | Status: DC
  Administered 2013-02-05 – 2013-02-06 (×3): 0.25 mg via RESPIRATORY_TRACT
  Filled 2013-02-05 (×6): qty 2

## 2013-02-05 MED ORDER — METOPROLOL TARTRATE 50 MG PO TABS
50.0000 mg | ORAL_TABLET | Freq: Every day | ORAL | Status: DC
  Administered 2013-02-05 – 2013-02-08 (×4): 50 mg via ORAL
  Filled 2013-02-05 (×4): qty 1

## 2013-02-05 MED ORDER — LEVALBUTEROL HCL 0.63 MG/3ML IN NEBU
0.6300 mg | INHALATION_SOLUTION | Freq: Four times a day (QID) | RESPIRATORY_TRACT | Status: DC
  Administered 2013-02-06 (×2): 0.63 mg via RESPIRATORY_TRACT
  Filled 2013-02-05 (×7): qty 3

## 2013-02-05 MED ORDER — IPRATROPIUM BROMIDE 0.02 % IN SOLN: 0.5000 mg | Freq: Four times a day (QID) | RESPIRATORY_TRACT | Status: DC

## 2013-02-05 MED ORDER — ONDANSETRON HCL 4 MG/2ML IJ SOLN: 4.0000 mg | Freq: Once | INTRAMUSCULAR | Status: DC | PRN

## 2013-02-05 MED ORDER — DOCUSATE SODIUM 100 MG PO CAPS: 100.0000 mg | ORAL_CAPSULE | Freq: Two times a day (BID) | ORAL | Status: DC

## 2013-02-05 MED ORDER — SODIUM CHLORIDE 0.9 % IJ SOLN: 9.0000 mL | INTRAMUSCULAR | Status: DC | PRN

## 2013-02-05 MED ORDER — PROPOFOL 10 MG/ML IV BOLUS
INTRAVENOUS | Status: DC | PRN
  Administered 2013-02-05: 160 mg via INTRAVENOUS

## 2013-02-05 MED ORDER — MIDAZOLAM HCL 5 MG/5ML IJ SOLN
INTRAMUSCULAR | Status: DC | PRN
  Administered 2013-02-05: 2 mg via INTRAVENOUS

## 2013-02-05 MED ORDER — HYDROMORPHONE HCL PF 1 MG/ML IJ SOLN
0.2500 mg | INTRAMUSCULAR | Status: DC | PRN
  Administered 2013-02-05 (×4): 0.5 mg via INTRAVENOUS

## 2013-02-05 MED ORDER — CITALOPRAM HYDROBROMIDE 20 MG PO TABS: 20.0000 mg | ORAL_TABLET | Freq: Every day | ORAL | Status: DC

## 2013-02-05 MED ORDER — LACTATED RINGERS IV SOLN
INTRAVENOUS | Status: DC | PRN
  Administered 2013-02-05: 06:00:00 via INTRAVENOUS

## 2013-02-05 MED ORDER — MORPHINE SULFATE (PF) 1 MG/ML IV SOLN
INTRAVENOUS | Status: AC
  Administered 2013-02-05: 13:00:00 via INTRAVENOUS
  Administered 2013-02-05: 14 mg via INTRAVENOUS
  Administered 2013-02-06: 1 mg via INTRAVENOUS
  Administered 2013-02-06: 4 mg via INTRAVENOUS
  Filled 2013-02-05: qty 25

## 2013-02-05 MED ORDER — ALBUTEROL SULFATE (5 MG/ML) 0.5% IN NEBU
2.5000 mg | INHALATION_SOLUTION | Freq: Four times a day (QID) | RESPIRATORY_TRACT | Status: DC | PRN
  Administered 2013-02-05 (×2): 2.5 mg via RESPIRATORY_TRACT

## 2013-02-05 MED ORDER — NALOXONE HCL 0.4 MG/ML IJ SOLN: 0.4000 mg | INTRAMUSCULAR | Status: DC | PRN

## 2013-02-05 MED ORDER — OXYCODONE HCL 5 MG PO TABS: 5.0000 mg | ORAL_TABLET | Freq: Once | ORAL | Status: DC | PRN

## 2013-02-05 MED ORDER — MORPHINE SULFATE 2 MG/ML IJ SOLN: 2.0000 mg | INTRAMUSCULAR | Status: DC | PRN

## 2013-02-05 MED ORDER — OXYCODONE HCL 5 MG PO TABS
5.0000 mg | ORAL_TABLET | ORAL | Status: DC | PRN
  Administered 2013-02-06: 5 mg via ORAL
  Filled 2013-02-05: qty 1

## 2013-02-05 MED ORDER — METHYLPREDNISOLONE SODIUM SUCC 125 MG IJ SOLR
60.0000 mg | Freq: Four times a day (QID) | INTRAMUSCULAR | Status: DC
  Administered 2013-02-05 – 2013-02-06 (×4): 60 mg via INTRAVENOUS
  Administered 2013-02-06: 04:00:00 via INTRAVENOUS
  Filled 2013-02-05 (×4): qty 0.96
  Filled 2013-02-05: qty 2
  Filled 2013-02-05: qty 0.96
  Filled 2013-02-05: qty 2
  Filled 2013-02-05 (×3): qty 0.96

## 2013-02-05 MED ORDER — ONDANSETRON HCL 4 MG/2ML IJ SOLN: 4.0000 mg | Freq: Four times a day (QID) | INTRAMUSCULAR | Status: DC | PRN

## 2013-02-05 MED ORDER — ASPIRIN EC 81 MG PO TBEC
81.0000 mg | DELAYED_RELEASE_TABLET | Freq: Every day | ORAL | Status: DC
  Administered 2013-02-05 – 2013-02-08 (×4): 81 mg via ORAL
  Filled 2013-02-05 (×4): qty 1

## 2013-02-05 MED ORDER — HYDROCODONE-ACETAMINOPHEN 5-325 MG PO TABS: 1.0000 | ORAL_TABLET | ORAL | Status: DC | PRN

## 2013-02-05 MED ORDER — CLINDAMYCIN PHOSPHATE 600 MG/50ML IV SOLN
600.0000 mg | Freq: Four times a day (QID) | INTRAVENOUS | Status: DC
  Administered 2013-02-05 (×2): 600 mg via INTRAVENOUS
  Filled 2013-02-05 (×5): qty 50

## 2013-02-05 MED ORDER — HYDROMORPHONE HCL PF 1 MG/ML IJ SOLN
1.0000 mg | INTRAMUSCULAR | Status: DC | PRN
  Administered 2013-02-05 (×2): 1 mg via INTRAVENOUS
  Filled 2013-02-05: qty 1

## 2013-02-05 MED ORDER — ALBUTEROL SULFATE (5 MG/ML) 0.5% IN NEBU
INHALATION_SOLUTION | RESPIRATORY_TRACT | Status: AC
  Filled 2013-02-05: qty 0.5

## 2013-02-05 MED ORDER — GLYCOPYRROLATE 0.2 MG/ML IJ SOLN
INTRAMUSCULAR | Status: DC | PRN
  Administered 2013-02-05: 0.2 mg via INTRAVENOUS

## 2013-02-05 MED ORDER — LEVALBUTEROL HCL 0.63 MG/3ML IN NEBU: 0.6300 mg | INHALATION_SOLUTION | RESPIRATORY_TRACT | Status: DC

## 2013-02-05 MED ORDER — ONDANSETRON HCL 4 MG PO TABS: 4.0000 mg | ORAL_TABLET | Freq: Four times a day (QID) | ORAL | Status: DC | PRN

## 2013-02-05 MED ORDER — ACETAMINOPHEN 10 MG/ML IV SOLN
1000.0000 mg | Freq: Four times a day (QID) | INTRAVENOUS | Status: AC
  Administered 2013-02-05 – 2013-02-06 (×4): 1000 mg via INTRAVENOUS
  Filled 2013-02-05 (×4): qty 100

## 2013-02-05 MED ORDER — LIDOCAINE HCL (CARDIAC) 20 MG/ML IV SOLN
INTRAVENOUS | Status: DC | PRN
  Administered 2013-02-05: 100 mg via INTRAVENOUS

## 2013-02-05 MED ORDER — NEOSTIGMINE METHYLSULFATE 1 MG/ML IJ SOLN
INTRAMUSCULAR | Status: DC | PRN
  Administered 2013-02-05: 2 mg via INTRAVENOUS

## 2013-02-05 MED ORDER — LINEZOLID 600 MG PO TABS
600.0000 mg | ORAL_TABLET | Freq: Two times a day (BID) | ORAL | Status: DC
  Administered 2013-02-05 – 2013-02-08 (×6): 600 mg via ORAL
  Filled 2013-02-05 (×7): qty 1

## 2013-02-05 MED ORDER — SODIUM CHLORIDE 0.9 % IR SOLN
Status: DC | PRN
  Administered 2013-02-05: 9000 mL

## 2013-02-05 MED ORDER — DEXTROSE-NACL 5-0.9 % IV SOLN
INTRAVENOUS | Status: DC
  Administered 2013-02-05: 04:00:00 via INTRAVENOUS

## 2013-02-05 MED ORDER — METHYLPREDNISOLONE SODIUM SUCC 125 MG IJ SOLR
INTRAMUSCULAR | Status: AC
  Filled 2013-02-05: qty 2

## 2013-02-05 MED ORDER — LEVALBUTEROL HCL 0.63 MG/3ML IN NEBU
0.6300 mg | INHALATION_SOLUTION | RESPIRATORY_TRACT | Status: DC
  Administered 2013-02-05 (×2): 0.63 mg via RESPIRATORY_TRACT
  Filled 2013-02-05 (×7): qty 3

## 2013-02-05 MED ORDER — OXYCODONE HCL 5 MG PO TABS: 5.0000 mg | ORAL_TABLET | ORAL | Status: DC | PRN

## 2013-02-05 MED ORDER — OXYCODONE HCL 5 MG/5ML PO SOLN: 5.0000 mg | Freq: Once | ORAL | Status: DC | PRN

## 2013-02-05 MED ORDER — ROCURONIUM BROMIDE 100 MG/10ML IV SOLN
INTRAVENOUS | Status: DC | PRN
  Administered 2013-02-05: 10 mg via INTRAVENOUS

## 2013-02-05 MED ORDER — ONDANSETRON HCL 4 MG/2ML IJ SOLN
INTRAMUSCULAR | Status: DC | PRN
  Administered 2013-02-05: 4 mg via INTRAVENOUS

## 2013-02-05 MED ORDER — ENOXAPARIN SODIUM 40 MG/0.4ML ~~LOC~~ SOLN
40.0000 mg | SUBCUTANEOUS | Status: DC
  Administered 2013-02-05: 40 mg via SUBCUTANEOUS
  Filled 2013-02-05: qty 0.4

## 2013-02-05 MED ORDER — LIDOCAINE HCL 4 % MT SOLN
OROMUCOSAL | Status: DC | PRN
  Administered 2013-02-05: 4 mL via TOPICAL

## 2013-02-05 MED ORDER — NICOTINE 21 MG/24HR TD PT24
21.0000 mg | MEDICATED_PATCH | Freq: Every day | TRANSDERMAL | Status: DC
  Administered 2013-02-05 – 2013-02-08 (×4): 21 mg via TRANSDERMAL
  Filled 2013-02-05 (×4): qty 1

## 2013-02-05 MED ORDER — SUCCINYLCHOLINE CHLORIDE 20 MG/ML IJ SOLN
INTRAMUSCULAR | Status: DC | PRN
  Administered 2013-02-05: 100 mg via INTRAVENOUS

## 2013-02-05 MED ORDER — LACTATED RINGERS IV SOLN
INTRAVENOUS | Status: DC
  Administered 2013-02-05 – 2013-02-06 (×2): via INTRAVENOUS
  Administered 2013-02-07: 20 mL/h via INTRAVENOUS

## 2013-02-05 MED ORDER — CITALOPRAM HYDROBROMIDE 40 MG PO TABS
40.0000 mg | ORAL_TABLET | Freq: Every day | ORAL | Status: DC
  Administered 2013-02-05 – 2013-02-08 (×4): 40 mg via ORAL
  Filled 2013-02-05 (×4): qty 1

## 2013-02-05 MED ORDER — ENOXAPARIN SODIUM 40 MG/0.4ML ~~LOC~~ SOLN
40.0000 mg | SUBCUTANEOUS | Status: DC
  Filled 2013-02-05: qty 0.4

## 2013-02-05 MED ORDER — LISINOPRIL 40 MG PO TABS
40.0000 mg | ORAL_TABLET | Freq: Every day | ORAL | Status: DC
  Filled 2013-02-05: qty 1

## 2013-02-05 MED ORDER — IPRATROPIUM BROMIDE 0.02 % IN SOLN
0.5000 mg | Freq: Four times a day (QID) | RESPIRATORY_TRACT | Status: DC
  Administered 2013-02-06 (×2): 0.5 mg via RESPIRATORY_TRACT
  Filled 2013-02-05 (×2): qty 2.5

## 2013-02-05 MED ORDER — IPRATROPIUM BROMIDE 0.02 % IN SOLN
0.5000 mg | RESPIRATORY_TRACT | Status: DC
  Administered 2013-02-05 (×2): 0.5 mg via RESPIRATORY_TRACT
  Filled 2013-02-05 (×2): qty 2.5

## 2013-02-05 MED ORDER — METHYLPREDNISOLONE SODIUM SUCC 125 MG IJ SOLR
125.0000 mg | Freq: Once | INTRAMUSCULAR | Status: AC
  Administered 2013-02-05: 125 mg via INTRAVENOUS

## 2013-02-05 MED ORDER — DIPHENHYDRAMINE HCL 50 MG/ML IJ SOLN: 12.5000 mg | Freq: Four times a day (QID) | INTRAMUSCULAR | Status: DC | PRN

## 2013-02-05 MED ORDER — LISINOPRIL 10 MG PO TABS
10.0000 mg | ORAL_TABLET | Freq: Every day | ORAL | Status: DC
  Administered 2013-02-05 – 2013-02-08 (×4): 10 mg via ORAL
  Filled 2013-02-05 (×4): qty 1

## 2013-02-05 MED ORDER — DIPHENHYDRAMINE HCL 12.5 MG/5ML PO ELIX: 12.5000 mg | ORAL_SOLUTION | Freq: Four times a day (QID) | ORAL | Status: DC | PRN

## 2013-02-05 MED ORDER — PANTOPRAZOLE SODIUM 40 MG PO TBEC
40.0000 mg | DELAYED_RELEASE_TABLET | Freq: Every day | ORAL | Status: DC
  Administered 2013-02-05 – 2013-02-08 (×4): 40 mg via ORAL
  Filled 2013-02-05 (×3): qty 1

## 2013-02-05 SURGICAL SUPPLY — 52 items
BLADE CUDA 5.5 (BLADE) IMPLANT
BLADE GREAT WHITE 4.2 (BLADE) IMPLANT
BNDG CMPR MED 15X6 ELC VLCR LF (GAUZE/BANDAGES/DRESSINGS) ×2
BNDG COHESIVE 6X5 TAN STRL LF (GAUZE/BANDAGES/DRESSINGS) ×2 IMPLANT
BNDG ELASTIC 6X15 VLCR STRL LF (GAUZE/BANDAGES/DRESSINGS) ×1 IMPLANT
BUR OVAL 6.0 (BURR) IMPLANT
CLOTH BEACON ORANGE TIMEOUT ST (SAFETY) ×3 IMPLANT
COVER SURGICAL LIGHT HANDLE (MISCELLANEOUS) ×3 IMPLANT
CUFF TOURNIQUET SINGLE 34IN LL (TOURNIQUET CUFF) IMPLANT
CUFF TOURNIQUET SINGLE 44IN (TOURNIQUET CUFF) IMPLANT
DRAIN WOUND SNY 15 RND (WOUND CARE) ×1 IMPLANT
DRAPE ARTHROSCOPY W/POUCH 114 (DRAPES) ×3 IMPLANT
DRAPE U-SHAPE 47X51 STRL (DRAPES) ×3 IMPLANT
DRSG EMULSION OIL 3X3 NADH (GAUZE/BANDAGES/DRESSINGS) ×3 IMPLANT
DRSG MEPILEX BORDER 4X8 (GAUZE/BANDAGES/DRESSINGS) ×3 IMPLANT
DRSG PAD ABDOMINAL 8X10 ST (GAUZE/BANDAGES/DRESSINGS) ×3 IMPLANT
DURAPREP 26ML APPLICATOR (WOUND CARE) ×3 IMPLANT
EVACUATOR 1/8 PVC DRAIN (DRAIN) ×1 IMPLANT
GAUZE PACKING IODOFORM 1/4X5 (PACKING) ×1 IMPLANT
GAUZE SPONGE 4X4 16PLY XRAY LF (GAUZE/BANDAGES/DRESSINGS) ×1 IMPLANT
GLOVE BIOGEL PI IND STRL 6.5 (GLOVE) ×2 IMPLANT
GLOVE BIOGEL PI IND STRL 7.0 (GLOVE) IMPLANT
GLOVE BIOGEL PI IND STRL 8 (GLOVE) IMPLANT
GLOVE BIOGEL PI IND STRL 8.5 (GLOVE) ×2 IMPLANT
GLOVE BIOGEL PI INDICATOR 6.5 (GLOVE)
GLOVE BIOGEL PI INDICATOR 7.0 (GLOVE) ×1
GLOVE BIOGEL PI INDICATOR 8 (GLOVE) ×2
GLOVE BIOGEL PI INDICATOR 8.5 (GLOVE) ×1
GLOVE ECLIPSE 6.0 STRL STRAW (GLOVE) ×2 IMPLANT
GLOVE ECLIPSE 8.5 STRL (GLOVE) ×3 IMPLANT
GOWN PREVENTION PLUS XXLARGE (GOWN DISPOSABLE) ×3 IMPLANT
GOWN STRL NON-REIN LRG LVL3 (GOWN DISPOSABLE) ×6 IMPLANT
IV NS IRRIG 3000ML ARTHROMATIC (IV SOLUTION) ×3 IMPLANT
KIT BASIN OR (CUSTOM PROCEDURE TRAY) ×3 IMPLANT
KIT ROOM TURNOVER OR (KITS) ×3 IMPLANT
MANIFOLD NEPTUNE II (INSTRUMENTS) ×3 IMPLANT
NDL 18GX1X1/2 (RX/OR ONLY) (NEEDLE) ×2 IMPLANT
NEEDLE 18GX1X1/2 (RX/OR ONLY) (NEEDLE) ×3 IMPLANT
PACK ARTHROSCOPY DSU (CUSTOM PROCEDURE TRAY) ×3 IMPLANT
PAD ARMBOARD 7.5X6 YLW CONV (MISCELLANEOUS) ×6 IMPLANT
PADDING CAST COTTON 6X4 STRL (CAST SUPPLIES) ×2 IMPLANT
SET ARTHROSCOPY TUBING (MISCELLANEOUS) ×3
SET ARTHROSCOPY TUBING LN (MISCELLANEOUS) ×2 IMPLANT
SPONGE GAUZE 4X4 12PLY (GAUZE/BANDAGES/DRESSINGS) ×1 IMPLANT
SUT ETHILON 2 0 FS 18 (SUTURE) ×1 IMPLANT
SUT ETHILON 4 0 PS 2 18 (SUTURE) ×2 IMPLANT
SUT VIC AB 2-0 CT1 18 (SUTURE) ×2 IMPLANT
SYR 20CC LL (SYRINGE) ×3 IMPLANT
TOWEL OR 17X24 6PK STRL BLUE (TOWEL DISPOSABLE) ×6 IMPLANT
TUBE CONNECTING 12X1/4 (SUCTIONS) ×3 IMPLANT
WAND 90 DEG TURBOVAC W/CORD (SURGICAL WAND) IMPLANT
WATER STERILE IRR 1000ML POUR (IV SOLUTION) ×3 IMPLANT

## 2013-02-05 NOTE — Progress Notes (Signed)
Repeat Albuterol Neb ordered per Dr Michelle Piper

## 2013-02-05 NOTE — Progress Notes (Signed)
Dr Michelle Piper at bedside.  States pt may not return to rm at this time. Requests Dr Shon Baton be notified of pt's resp status in pacu.  Dr Shon Baton paged.

## 2013-02-05 NOTE — Op Note (Signed)
NAME:  Nathaniel [name], [place] NO.:  [id number]  MEDICAL RECORD NO.:  [id number]  LOCATION:  [facility]                         FACILITY:  [facility]  PHYSICIAN:  Alvy Beal, MD    DATE OF BIRTH:  Oct 09, 1980  DATE OF PROCEDURE: DATE OF DISCHARGE:                              OPERATIVE REPORT   PREOPERATIVE DIAGNOSIS:  Septic left knee.  POSTOPERATIVE DIAGNOSIS:  Septic left knee.  OPERATIVE PROCEDURES: 1. Arthroscopic I and D of the knee. 2. Direct I and D of infrapatellar abscess.  COMPLICATIONS:  None.  CONDITION:  Stable.  HISTORY:  This is a very pleasant gentleman with a 5-day history of progressive knee swelling, erythema, and pain, and loss of motion.  He was transferred from an outside institution to the medical service.  He was given antibiotics at the outside institution and again here.  I was consulted to see the patient for further evaluation.  After determining the patient did have potential septic knee, we elected to take him to the operating room.  All appropriate risks, benefits, and alternatives were discussed and consent was obtained.  OPERATIVE NOTE:  The patient was brought to the operating room, placed supine on the operating table.  After successful induction of general anesthesia and endotracheal intubation, the left lower extremity was prepped and draped in standard fashion.  Time-out was taken to confirm the patient, procedure, and all other pertinent important data.  Once this was done, a lateral suprapatellar incision was made, and I advanced the inflow portal.  I then made a medial infrapatellar incision with a 11 blade scalpel and placed the outflow cannula.  I then took samples of the synovial fluid and sent it for culture analysis.  It was not grossly purulent.  I then irrigated with 9 L of fluid.  Once this was completed, I then placed a drain through the inflow cannula into the knee point.  I then incised the infrapatellar abscess.   There was a scab over it.  I then expressed frankly purulent material which I also sent for cultures.  I then irrigated this out as well.  I then packed it with quarter-inch packing gauze and then used a 2 Ethibond to close the arthroscopic portals.  Bulky dry dressing was applied, then an Ace wrap. The patient was extubated, transferred to PACU without incident.  At the end of the case, all needle and sponge counts were correct.     Alvy Beal, MD     DDB/MEDQ  D:  02/05/2013  T:  02/05/2013  Job:  [id number]

## 2013-02-05 NOTE — Progress Notes (Signed)
Oral airway removed pt waking uo responding following commands dr Michelle Piper in to see pt

## 2013-02-05 NOTE — Progress Notes (Signed)
Report given to rebecca slade rn as caregiver 

## 2013-02-05 NOTE — Progress Notes (Signed)
Patient seen and examined, admitted by Dr. Nedra Hai this morning Briefly 32 year old male with history of nicotine abuse, COPD, hypertension transferred from [facility] for left knee septic arthritis   Left septic knee - Status post I&D today in OR, he is allergic to penicillins, vancomycin - Currently placed on clindamycin and ciprofloxacin - Will follow cultures, ID consultation called, d/w Dr Orvan Falconer   COPD exacerbation acute - Called by Dr. Shon Baton with patient's respiratory status in PACU due to respiratory distress, acute wheezing, hypoxia. I examined the patient in PACU, ordered scheduled Xopenex/Atrovent nebs, Pulmicort, Solu-Medrol, O2 supplementation - Placed on incentive spirometry  Nicotine abuse - Place on nicotine patch   RAI,RIPUDEEP M.D. [facility] 02/05/2013, 2:18 PM  Pager: [phone]

## 2013-02-05 NOTE — Brief Op Note (Signed)
02/05/2013  6:55 AM  PATIENT:  Nathaniel [name]  32 y.o. male  PRE-OPERATIVE DIAGNOSIS:  Septic Left Knee  POST-OPERATIVE DIAGNOSIS:  Septic Left Knee  PROCEDURE:  Procedure(s): KNEE ARTHROSCOPY   (Left) IRRIGATION AND DEBRIDEMENT KNEE patella bursa  SURGEON:  Surgeon(s) and Role:    * Venita Lick, MD - Primary  PHYSICIAN ASSISTANT:   ASSISTANTS: none   ANESTHESIA:   general  EBL:  Total I/O In: -  Out: 400 [Urine:400]  BLOOD ADMINISTERED:none  DRAINS: Penrose drain in the knee   LOCAL MEDICATIONS USED:  NONE  SPECIMEN:  Aspirate  DISPOSITION OF SPECIMEN:  PATHOLOGY  COUNTS:  YES  TOURNIQUET:  * No tourniquets in log *  DICTATION: .Other Dictation: Dictation Number [id number]  PLAN OF CARE: Admit to inpatient   PATIENT DISPOSITION:  PACU - hemodynamically stable.

## 2013-02-05 NOTE — Consult Note (Signed)
[facility]    Date of Admission:  02/05/2013           Day 2 ciprofloxacin        Day 2 clindamycin       Reason for Consult: Septic arthritis of left knee    Referring Physician: Dr. Thad Ranger  Principal Problem:   Septic arthritis of knee Active Problems:   Tobacco abuse   HTN (hypertension)   Hyperglycemia   . acetaminophen  1,000 mg Intravenous Q6H  . aspirin EC  81 mg Oral Daily  . budesonide  0.25 mg Nebulization BID  . ciprofloxacin  400 mg Intravenous Q12H  . citalopram  40 mg Oral Daily  . clindamycin (CLEOCIN) IV  600 mg Intravenous Q6H  . enoxaparin (LOVENOX) injection  40 mg Subcutaneous Q24H  . levalbuterol  0.63 mg Nebulization Q4H   And  . ipratropium  0.5 mg Nebulization Q4H  . lisinopril  10 mg Oral Daily  . methylPREDNISolone (SOLU-MEDROL) injection  60 mg Intravenous Q6H  . metoprolol tartrate  50 mg Oral Daily  . morphine   Intravenous Q4H  . nicotine  21 mg Transdermal Daily  . pantoprazole  40 mg Oral Daily    Recommendations: 1. Change current antibiotics to oral linezolid pending culture results   Assessment: Since I do not have records to review his reported drug allergies I will treat him with oral linezolid pending cultures to cover staph including MRSA and strep.    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 He was seen in [facility] yesterday where synovial fluid was obtained suggesting septic arthritis. Gram stain showed rare gram-positive cocci in clusters. Cultures have shown no growth so far. He was transferred here and underwent surgery last night showing purulent joint fluid. Gram stain revealed some gram-positive cocci but cultures are pending.  He has a history of some sort of a severe reaction to penicillin antibiotics when he was hospitalized several years ago at [facility]. He underwent  cholecystectomy and it sounds like he had bowel perforation. He was hospitalized 2 years ago at [facility] and he says that he developed swelling in his throat while on vancomycin. He does not know how many doses he received or what he was being treated for.   Review of Systems: Constitutional: positive for sweats, negative for anorexia, chills, fevers and weight loss Eyes: negative Ears, nose, mouth, throat, and face: negative Respiratory: negative Cardiovascular: he says he takes metoprolol because his heart doesn't get enough oxygen Gastrointestinal: negative Genitourinary:negative Integument/breast: negative  Past Medical History  Diagnosis Date  . COPD (chronic obstructive pulmonary disease)   . Bell's palsy   . IBS (irritable bowel syndrome)   . Hypertension     History  Substance Use Topics  . Smoking status: Current Every Day Smoker -- 0.50 packs/day for 12 years    Types: Cigarettes  . Smokeless tobacco: Never Used  . Alcohol Use: No    History reviewed. No pertinent family history. Allergies  Allergen Reactions  . Flagyl (Metronidazole) Swelling  . Latex Hives  . Penicillins Swelling  . Phenergan (Promethazine Hcl) Other (See Comments)    Seizure  . Vancomycin Swelling    OBJECTIVE: Blood pressure 121/71, pulse 101, temperature 98.5 F (36.9 C), temperature source Oral, resp. rate 20, height 6\' 1"  (1.854  m), weight 108.863 kg (240 lb), SpO2 97.00%. General: He is slightly uncomfortable because of pain Skin: Very dry skin on hands. Several tattoos Oral: No oropharyngeal lesions Lungs: Clear Cor: Very distant heart sounds. No murmur heard Abdomen: Obese, soft and nontender Left leg in Ace wrap  Lab Results  Component Value Date   WBC 11.9* 02/05/2013   HGB 13.5 02/05/2013   HCT 39.9 02/05/2013   MCV 87.9 02/05/2013   PLT 232 02/05/2013   BMET    Component Value Date/Time   NA 132* 02/05/2013 1354   K 3.8 02/05/2013 1354   CL 97 02/05/2013 1354    CO2 21 02/05/2013 1354   GLUCOSE 215* 02/05/2013 1354   BUN 8 02/05/2013 1354   CREATININE 1.07 02/05/2013 1354   CALCIUM 8.9 02/05/2013 1354   GFRNONAA >90 02/05/2013 1354   GFRAA >90 02/05/2013 1354    Microbiology: Recent Results (from the past 240 hour(s))  GRAM STAIN     Status: None   Collection Time    02/05/13  7:20 AM      Result Value Range Status   Specimen Description WOUND LEFT KNEE   Final   Special Requests NUMBER 1,PT ON CLEOCYIN   Final   Gram Stain     Final   Value: MODERATE WBC PRESENT,BOTH PMN AND MONONUCLEAR     RARE GRAM POSITIVE COCCI IN PAIRS IN CLUSTERS     Gram Stain Report Called to,Read Back By and Verified With: S.GREGSON,RN 02/05/13 0820 BY BSLADE   Report Status 02/05/2013 FINAL   Final  GRAM STAIN     Status: None   Collection Time    02/05/13  7:24 AM      Result Value Range Status   Specimen Description WOUND LEFT KNEE   Final   Special Requests NUMBER 2,PT ON CLEOCYIN   Final   Gram Stain     Final   Value: RARE WBC PRESENT,BOTH PMN AND MONONUCLEAR     NO ORGANISMS SEEN     Gram Stain Report Called to,Read Back By and Verified With: S.GREGSON,RN 02/05/13 0820 BY BSLADE   Report Status 02/05/2013 FINAL   Final    Cliffton Asters, MD [facility] [phone] pager   [phone] cell 02/05/2013, 4:16 PM

## 2013-02-05 NOTE — Progress Notes (Signed)
Informed MD pt's abg results.  She will see pt in pacu.

## 2013-02-05 NOTE — Transfer of Care (Signed)
Immediate Anesthesia Transfer of Care Note  Patient: Nathaniel [name]  Procedure(s) Performed: Procedure(s): KNEE ARTHROSCOPY   (Left) IRRIGATION AND DEBRIDEMENT KNEE patella bursa  Patient Location: PACU  Anesthesia Type:General  Level of Consciousness: sedated and patient cooperative  Airway & Oxygen Therapy: Patient connected to face mask oxygen  Post-op Assessment: Report given to PACU RN, Post -op Vital signs reviewed and stable and Patient moving all extremities  Post vital signs: Reviewed and stable  Complications: No apparent anesthesia complications

## 2013-02-05 NOTE — Consult Note (Signed)
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 for swelling of his left leg with erythema and left knee pain and swelling. Work up there included a WBC of 11K, with normal Hb, and normal renal fx tests. His knee xray showed effusion and subcutaneous inflammation. His left knee arthrocentesis showed 55K WBC, and GS showed rare GPCs. Hospitalist at [facility] was consulted, but there were no orthopedics there for consultation. Transferred for further management.  Past Medical History  Diagnosis Date  . COPD (chronic obstructive pulmonary disease)   . Bell's palsy     Allergies  Allergen Reactions  . Flagyl (Metronidazole)   . Latex   . Penicillins   . Phenergan (Promethazine Hcl)   . Vancomycin     No current facility-administered medications on file prior to encounter.   Current Outpatient Prescriptions on File Prior to Encounter  Medication Sig Dispense Refill  . citalopram (CELEXA) 20 MG tablet Take 20 mg by mouth daily.      Marland Kitchen lisinopril (PRINIVIL,ZESTRIL) 40 MG tablet Take 40 mg by mouth daily.        Physical Exam: Filed Vitals:   02/05/13 0329  BP: 122/68  Pulse: 88  Temp: 97.8 F (36.6 C)  Resp: 16  A+O X3 No sob/cp Abd soft/NT.  Compartments soft/NT 2+ DP/PT pulses Left knee:   Pain with PROM. Swollen tender knee.  Minor erythema.     Image: Reviewed images from outside hospital - no fracture noted.  No free air    A/P: Patient with progressive left knee pain, swelling, and loss in ROM for 5 days.  Admitted in transfer from outside hospital.  Patient with LE cellulitis and septic knee.  Already given IV clindamycin at [facility]. Plan:  Patient with signs/symptoms of septic joint.  Plan on emergent I+D of the knee.  Will require ID consult to manage abx during and following hospitalization  Will continue to  monitor.  Discussed risks/benefits of surgery with patient and family - include need for further surgery, worsening  infection, septic arthritis, ongoing pain.

## 2013-02-05 NOTE — Progress Notes (Signed)
Dr Isidoro Donning at bedside.  She states pt may go back to room at this time.

## 2013-02-05 NOTE — Anesthesia Preprocedure Evaluation (Addendum)
Anesthesia Evaluation  Patient identified by MRN, date of birth, ID band Patient awake    Reviewed: Allergy & Precautions, H&P , NPO status , Patient's Chart, lab work & pertinent test results, reviewed documented beta blocker date and time   Airway Mallampati: III  Neck ROM: Full  Mouth opening: Limited Mouth Opening  Dental  (+) Teeth Intact and Dental Advisory Given   Pulmonary COPDCurrent Smoker,  breath sounds clear to auscultation        Cardiovascular hypertension, Pt. on medications Rhythm:Regular Rate:Normal     Neuro/Psych    GI/Hepatic   Endo/Other    Renal/GU      Musculoskeletal   Abdominal (+) + obese,   Peds  Hematology   Anesthesia Other Findings   Reproductive/Obstetrics                          Anesthesia Physical Anesthesia Plan  ASA: II and emergent  Anesthesia Plan: General   Post-op Pain Management:    Induction: Intravenous  Airway Management Planned: Oral ETT  Additional Equipment:   Intra-op Plan:   Post-operative Plan: Extubation in OR  Informed Consent: I have reviewed the patients History and Physical, chart, labs and discussed the procedure including the risks, benefits and alternatives for the proposed anesthesia with the patient or authorized representative who has indicated his/her understanding and acceptance.   Dental advisory given  Plan Discussed with: Anesthesiologist and Surgeon  Anesthesia Plan Comments:        Anesthesia Quick Evaluation

## 2013-02-05 NOTE — Preoperative (Signed)
Beta Blockers   Reason not to administer Beta Blockers:Not Applicable. No home beta blockers 

## 2013-02-05 NOTE — H&P (Addendum)
[facility] History and Physical  [name] ZOX:[id number] DOB: 04-20-1981    PCP:   Alinda Deem, MD   Chief Complaint: transferred from [facility] ER for cellulitis and septic arthritis.  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  Work up there included a WBC of 11K, with normal Hb, and normal renal fx tests.  His knee xray showed effusion and subcutaneous inflammation.  His left knee arthrocentesis showed 55K WBC, and GS showed rare GPCs.  Hospitalist at [facility] was consulted, but there were no orthopedics there for consultation.  Dr Shon Baton were consulted by EDP and will see this patient in consultation.  I have accepted him in transfer.  The duration of his illness was about 5 days, and he didn't recall any inciting event.  His last DT was 2 years ago.  Rewiew of Systems:  Constitutional: Negative for malaise, fever and chills. No significant weight loss or weight gain Eyes: Negative for eye pain, redness and discharge, diplopia, visual changes, or flashes of light. ENMT: Negative for ear pain, hoarseness, nasal congestion, sinus pressure and sore throat. No headaches; tinnitus, drooling, or problem swallowing. Cardiovascular: Negative for chest pain, palpitations, diaphoresis, dyspnea and peripheral edema. ; No orthopnea, PND Respiratory: Negative for cough, hemoptysis, wheezing and stridor. No pleuritic chestpain. Gastrointestinal: Negative for nausea, vomiting, diarrhea, constipation, abdominal pain, melena, blood in stool, hematemesis, jaundice and rectal bleeding.    Genitourinary: Negative for frequency, dysuria, incontinence,flank pain and hematuria; Musculoskeletal: Negative for back pain and neck pain. Negative for trauma.;  Skin: . Negative for pruritus,  abrasions, bruising and skin lesion.;  ulcerations Neuro: Negative for headache, lightheadedness and neck stiffness. Negative for weakness, altered level of consciousness , altered mental status, extremity weakness, burning feet, involuntary movement, seizure and syncope.  Psych: negative for anxiety, depression, insomnia, tearfulness, panic attacks, hallucinations, paranoia, suicidal or homicidal ideation    Past Medical History  Diagnosis Date  . COPD (chronic obstructive pulmonary disease)   . Bell's palsy     Past Surgical History  Procedure Laterality Date  . Cholecystectomy    . Abdominal surgery      Medications:  HOME MEDS: Prior to Admission medications   Medication Sig Start Date End Date Taking? Authorizing Provider  citalopram (CELEXA) 20 MG tablet Take 20 mg by mouth daily.    Historical Provider, MD  lisinopril (PRINIVIL,ZESTRIL) 40 MG tablet Take 40 mg by mouth daily.    Historical Provider, MD     Allergies:  Allergies  Allergen Reactions  . Flagyl (Metronidazole)   . Latex   . Penicillins   . Phenergan (Promethazine Hcl)   . Vancomycin     Social History:   reports that he has been smoking.  He does not have any smokeless tobacco history on file. He reports that he does not drink alcohol or use illicit drugs.  Family History: No family history on file.   Physical Exam: There were no vitals filed for this visit. There were no vitals taken for this visit.  GEN:  Pleasant patient lying in the stretcher in no acute distress; cooperative with exam. PSYCH:  alert and oriented x4; does not appear anxious or depressed; affect is appropriate. HEENT: Mucous membranes pink and anicteric; PERRLA; EOM intact; no cervical lymphadenopathy nor thyromegaly or carotid bruit; no JVD; There were no  stridor. Neck is very supple. Breasts:: Not examined CHEST WALL: No tenderness CHEST: Normal respiration, clear to auscultation bilaterally.  HEART: Regular rate and rhythm.  There are no murmur, rub, or  gallops.   BACK: No kyphosis or scoliosis; no CVA tenderness ABDOMEN: soft and non-tender; no masses, no organomegaly, normal abdominal bowel sounds; no pannus; no intertriginous candida. There is no rebound and no distention. Rectal Exam: Not done EXTREMITIES: No bone or joint deformity; age-appropriate arthropathy of the hands and knees; no edema; no ulcerations.  There is no calf tenderness. Genitalia: not examined PULSES: 2+ and symmetric SKIN: Normal hydration no rash or ulceration CNS: Cranial nerves 2-12 grossly intact no focal lateralizing neurologic deficit.  Speech is fluent; uvula elevated with phonation, facial symmetry and tongue midline. DTR are normal bilaterally, cerebella exam is intact, barbinski is negative and strengths are equaled bilaterally.  No sensory loss.   Labs on Admission:   LABS done as above at [facility] ER.  There were not repeated.   Assessment/Plan Present on Admission:  . Septic arthritis of knee . Cellulitis of leg, left . Tobacco abuse . HTN (hypertension)  PLAN:  Will admit him for cellulitis, and I suspect he does have septic arthritis as well.  Will continue with Clinda and Cipro, as he is allergic to Vancomycin.  Dr Shon Baton will make further recommendations as necessary.   I have made him NPO.  Will continue his meds for HTN and depression.  He was advised to quit cigarettes.  He is stable, full code, and will be admitted to [facility] service.  Thank you for allowing me to participate in the care of your nice patient.  Other plans as per orders.  Code Status: FULL Unk Lightning, MD. [facility] Pager [phone] 7pm to 7am.  02/05/2013, 3:22 AM

## 2013-02-06 ENCOUNTER — Encounter (HOSPITAL_COMMUNITY): Payer: Self-pay | Admitting: Orthopedic Surgery

## 2013-02-06 DIAGNOSIS — J441 Chronic obstructive pulmonary disease with (acute) exacerbation: Secondary | ICD-10-CM | POA: Diagnosis not present

## 2013-02-06 DIAGNOSIS — R7309 Other abnormal glucose: Secondary | ICD-10-CM

## 2013-02-06 DIAGNOSIS — B9689 Other specified bacterial agents as the cause of diseases classified elsewhere: Secondary | ICD-10-CM

## 2013-02-06 LAB — CBC
MCH: 29.7 pg (ref 26.0–34.0)
MCV: 86.8 fL (ref 78.0–100.0)
Platelets: 234 10*3/uL (ref 150–400)
RDW: 12.6 % (ref 11.5–15.5)

## 2013-02-06 LAB — BASIC METABOLIC PANEL
BUN: 10 mg/dL (ref 6–23)
CO2: 26 mEq/L (ref 19–32)
Calcium: 9 mg/dL (ref 8.4–10.5)
Creatinine, Ser: 0.95 mg/dL (ref 0.50–1.35)
Glucose, Bld: 165 mg/dL — ABNORMAL HIGH (ref 70–99)

## 2013-02-06 LAB — GLUCOSE, CAPILLARY: Glucose-Capillary: 179 mg/dL — ABNORMAL HIGH (ref 70–99)

## 2013-02-06 MED ORDER — MORPHINE SULFATE 2 MG/ML IJ SOLN
2.0000 mg | INTRAMUSCULAR | Status: DC | PRN
  Administered 2013-02-06 – 2013-02-08 (×3): 2 mg via INTRAVENOUS
  Filled 2013-02-06 (×3): qty 1

## 2013-02-06 MED ORDER — IPRATROPIUM-ALBUTEROL 20-100 MCG/ACT IN AERS
2.0000 | INHALATION_SPRAY | Freq: Four times a day (QID) | RESPIRATORY_TRACT | Status: DC
  Administered 2013-02-06 – 2013-02-08 (×8): 2 via RESPIRATORY_TRACT
  Filled 2013-02-06: qty 4

## 2013-02-06 MED ORDER — ENOXAPARIN SODIUM 60 MG/0.6ML ~~LOC~~ SOLN
55.0000 mg | SUBCUTANEOUS | Status: DC
  Administered 2013-02-06 – 2013-02-07 (×2): 55 mg via SUBCUTANEOUS
  Filled 2013-02-06 (×3): qty 0.6

## 2013-02-06 MED ORDER — BUDESONIDE-FORMOTEROL FUMARATE 160-4.5 MCG/ACT IN AERO
2.0000 | INHALATION_SPRAY | Freq: Two times a day (BID) | RESPIRATORY_TRACT | Status: DC
  Administered 2013-02-06 – 2013-02-08 (×4): 2 via RESPIRATORY_TRACT
  Filled 2013-02-06: qty 6

## 2013-02-06 MED ORDER — OXYCODONE HCL 5 MG PO TABS
5.0000 mg | ORAL_TABLET | ORAL | Status: DC | PRN
  Administered 2013-02-06 – 2013-02-08 (×8): 10 mg via ORAL
  Filled 2013-02-06 (×8): qty 2

## 2013-02-06 NOTE — Progress Notes (Signed)
Physical Therapy Evaluation Patient Details Name: Nathaniel [name] MRN: [id number] DOB: [birth year] Today's Date: 02/06/2013 Time: 4098-1191 PT Time Calculation (min): 30 min  PT Assessment / Plan / Recommendation History of Present Illness  Pt was bitten by a spider which led to septic arthritis left knee. Underwent I & D 02/05/13.  Clinical Impression  Pt is independent with mobility, taught ROM and strengthening exercises for left knee and pt tolerated well. Encouraged him to use RW that he has when he first gets up to help "warm-up" knee and prevent antalgic gait pattern. Pt will not likely be compliant with this. Do not recommend any f/u PT unless pt experiences dysfunction of the knee in 4-6 wks at which point, would rec outpt PT. PT signing off.    PT Assessment  All further PT needs can be met in the next venue of care    Follow Up Recommendations  No PT follow up    Does the patient have the potential to tolerate intense rehabilitation      Barriers to Discharge        Equipment Recommendations  None recommended by PT    Recommendations for Other Services     Frequency      Precautions / Restrictions Precautions Precautions: None Restrictions Weight Bearing Restrictions: No   Pertinent Vitals/Pain 8/10 left knee pain after ambulating, leg elevated      Mobility  Bed Mobility Bed Mobility: Supine to Sit;Sitting - Scoot to Edge of Bed Supine to Sit: 7: Independent Sitting - Scoot to [place] of Bed: 7: Independent Transfers Transfers: Sit to Stand;Stand to Sit Sit to Stand: 7: Independent Stand to Sit: 7: Independent Ambulation/Gait Ambulation/Gait Assistance: 7: Independent Ambulation Distance (Feet): 400 Feet Assistive device: Rolling walker;None Ambulation/Gait Assistance Details: pt began with no AD and gait pattern very antalgic and assymetrical. Convinced him to use RW for improving pattern as well as working on flexing knee during gait. After 300' with RW, pt  could ambulate with much improved gait pattern with no AD. Encouraged him to use one at home when first ambulating and he says he will not, continued to encourage and educated wife on benefits. Pt independent with ambulation, even when gait pattern is poor.  Gait Pattern: Antalgic Gait velocity: WFL Stairs: Yes Stairs Assistance: 7: Independent Stair Management Technique: No rails;Alternating pattern;Forwards Number of Stairs: 4 Corporate treasurer: No    Exercises General Exercises - Lower Extremity Long Arc Quad: AROM;Left;10 reps;Seated Heel Slides: AROM;Seated;10 reps;Left Mini-Sqauts: AROM;10 reps;Standing Other Exercises Other Exercises: explained multiple stretching exercises for knee flexion and extension Other Exercises: Step-downs from bottom step, 10x   PT Diagnosis: Acute pain;Abnormality of gait  PT Problem List: Decreased range of motion;Pain PT Treatment Interventions:       PT Goals(Current goals can be found in the care plan section) Acute Rehab PT Goals Patient Stated Goal: return to home and work  Visit Information  Last PT Received On: 02/06/13 Assistance Needed: +1 History of Present Illness: Pt was bitten by a spider which led to septic arthritis left knee. Underwent I & D 02/05/13.       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: No difficulties    Copywriter, advertising Arousal/Alertness: Awake/alert Behavior  During Therapy: WFL for tasks assessed/performed Overall Cognitive Status: Within Functional Limits for tasks assessed    Extremity/Trunk Assessment Upper Extremity Assessment Upper Extremity Assessment: Overall WFL for tasks assessed Lower Extremity Assessment Lower Extremity Assessment: Overall WFL for tasks assessed Cervical / Trunk Assessment Cervical / Trunk Assessment: Normal   Balance Balance Balance Assessed: Yes Dynamic Standing Balance Dynamic Standing - Balance Support: No upper extremity supported;During functional activity Dynamic Standing - Level of Assistance: 7: Independent  End of Session PT - End of Session Equipment Utilized During Treatment: Gait belt Activity Tolerance: Patient tolerated treatment well Patient left: in chair;with call bell/phone within reach;with family/visitor present Nurse Communication: Mobility status  GP   Lyanne Co, PT  Acute Rehab Services  [phone]   Lyanne Co 02/06/2013, 2:40 PM

## 2013-02-06 NOTE — Progress Notes (Addendum)
Linezolid 600mg  copay for patient would be $70 according to customer service at [facility].  Patient says that he is not working at the moment, and just started a new job this past Monday and is worried that he will lose that job due to the hospitalization.  He will not be able to afford the copay.    There are 3 patient assistance programs, two of which the patient is not eligible for due to having some insurance.  There is one that is for "underinsured" and I have given the patient the front page of that application to complete.  The second page of the application requires completion by MD.  Once completed, will fax in tomorrow to see if pt can qualify for assistance.  *Dr. Susanne Borders you please complete the MD portion of the application?  It is in the drawer with pt's physical chart on 6N[address].  Thank you.  UPDATE 02/07/2013 1058am-- Called the assistance program at [employer] to do application over phone in interest of time.  They are currently checking pt's insurance benefits to confirm what I was told yesterday, the $70 copay, and will have someone call back to advise me of the outcome. Await this call and will update notes as soon as I have an answer.

## 2013-02-06 NOTE — Progress Notes (Signed)
Patient ID: Nathaniel [name], male   DOB: Dec 16, 1980, 32 y.o.   MRN: [id number]         [facility] for Infectious Disease    Date of Admission:  02/05/2013   Total days of antibiotics 3        Day 1 linezolid         Principal Problem:   Septic arthritis of knee Active Problems:   Tobacco abuse   HTN (hypertension)   Hyperglycemia   . aspirin EC  81 mg Oral Daily  . budesonide-formoterol  2 puff Inhalation BID  . citalopram  40 mg Oral Daily  . enoxaparin (LOVENOX) injection  55 mg Subcutaneous Q24H  . linezolid  600 mg Oral Q12H  . lisinopril  10 mg Oral Daily  . methylPREDNISolone (SOLU-MEDROL) injection  60 mg Intravenous Q6H  . metoprolol tartrate  50 mg Oral Daily  . nicotine  21 mg Transdermal Daily  . pantoprazole  40 mg Oral Daily    Subjective: He is feeling a little bit better but still rates his left knee pain at 6/10. He has not been out of bed today.   Past Medical History  Diagnosis Date  . COPD (chronic obstructive pulmonary disease)   . Bell's palsy   . IBS (irritable bowel syndrome)   . Hypertension     History  Substance Use Topics  . Smoking status: Current Every Day Smoker -- 0.50 packs/day for 12 years    Types: Cigarettes  . Smokeless tobacco: Never Used  . Alcohol Use: No    History reviewed. No pertinent family history.  Allergies  Allergen Reactions  . Flagyl (Metronidazole) Swelling  . Latex Hives  . Penicillins Swelling  . Phenergan (Promethazine Hcl) Other (See Comments)    Seizure  . Vancomycin Swelling    Objective: Temp:  [97.4 F (36.3 C)-99.3 F (37.4 C)] 97.4 F (36.3 C) (07/23 0558) Pulse Rate:  [66-112] 66 (07/23 0558) Resp:  [16-24] 16 (07/23 0558) BP: (113-127)/(58-77) 120/66 mmHg (07/23 0558) SpO2:  [93 %-100 %] 100 % (07/23 0913)  General: This alert comfortable Skin: No rash Drain remains in left knee  Lab Results Lab Results  Component Value Date   WBC 12.3* 02/06/2013   HGB 12.4* 02/06/2013   HCT 36.3* 02/06/2013   MCV 86.8 02/06/2013   PLT 234 02/06/2013    Lab Results  Component Value Date   CREATININE 0.95 02/06/2013   BUN 10 02/06/2013   NA 136 02/06/2013   K 4.1 02/06/2013   CL 101 02/06/2013   CO2 26 02/06/2013    Lab Results  Component Value Date   ALT 38 07/18/2012   AST 36 07/18/2012   ALKPHOS 116 07/18/2012   BILITOT 0.6 07/18/2012      Microbiology: Recent Results (from the past 240 hour(s))  WOUND CULTURE     Status: None   Collection Time    02/05/13  7:20 AM      Result Value Range Status   Specimen Description WOUND LEFT KNEE   Final   Special Requests NUMBER ONE,PT ON CLEOCYIN   Final   Gram Stain PENDING   Incomplete   Culture Culture reincubated for better growth   Final   Report Status PENDING   Incomplete  ANAEROBIC CULTURE     Status: None   Collection Time    02/05/13  7:20 AM      Result Value Range Status   Specimen Description WOUND LEFT KNEE  Final   Special Requests NUMBER 1,PT ON CLEOCYIN   Final   Gram Stain     Final   Value: RARE WBC PRESENT, PREDOMINANTLY PMN     RARE SQUAMOUS EPITHELIAL CELLS PRESENT     RARE GRAM POSITIVE COCCI     IN PAIRS   Culture     Final   Value: NO ANAEROBES ISOLATED; CULTURE IN PROGRESS FOR 5 DAYS   Report Status PENDING   Incomplete  GRAM STAIN     Status: None   Collection Time    02/05/13  7:20 AM      Result Value Range Status   Specimen Description WOUND LEFT KNEE   Final   Special Requests NUMBER 1,PT ON CLEOCYIN   Final   Gram Stain     Final   Value: MODERATE WBC PRESENT,BOTH PMN AND MONONUCLEAR     RARE GRAM POSITIVE COCCI IN PAIRS IN CLUSTERS     Gram Stain Report Called to,Read Back By and Verified With: S.GREGSON,RN 02/05/13 0820 BY BSLADE   Report Status 02/05/2013 FINAL   Final  WOUND CULTURE     Status: None   Collection Time    02/05/13  7:24 AM      Result Value Range Status   Specimen Description WOUND LEFT KNEE   Final   Special Requests NUMBER 2,PT ON CLEOCYIN    Final   Gram Stain  PENDING   Incomplete   Culture NO GROWTH 1 DAY   Final   Report Status PENDING   Incomplete  ANAEROBIC CULTURE     Status: None   Collection Time    02/05/13  7:24 AM      Result Value Range Status   Specimen Description WOUND LEFT KNEE   Final   Special Requests NUMBER 2,PT ON CLEOCYIN   Final   Gram Stain     Final   Value: NO WBC SEEN     NO SQUAMOUS EPITHELIAL CELLS SEEN     NO ORGANISMS SEEN   Culture     Final   Value: NO ANAEROBES ISOLATED; CULTURE IN PROGRESS FOR 5 DAYS   Report Status PENDING   Incomplete  GRAM STAIN     Status: None   Collection Time    02/05/13  7:24 AM      Result Value Range Status   Specimen Description WOUND LEFT KNEE   Final   Special Requests NUMBER 2,PT ON CLEOCYIN   Final   Gram Stain     Final   Value: RARE WBC PRESENT,BOTH PMN AND MONONUCLEAR     NO ORGANISMS SEEN     Gram Stain Report Called to,Read Back By and Verified With: S.GREGSON,RN 02/05/13 0820 BY BSLADE   Report Status 02/05/2013 FINAL   Final    Studies/Results: Dg Chest Port 1 View  02/05/2013   *RADIOLOGY REPORT*  Clinical Data: Tachypnea.  Wheezing.  PORTABLE CHEST - 1 VIEW  Comparison: Chest x-ray 11/08/2012.  Findings: Lung volumes are slightly low.  No consolidative airspace disease.  No pleural effusions.  No pneumothorax.  No pulmonary nodule or mass noted.  Pulmonary vasculature and the cardiomediastinal silhouette are within normal limits.  IMPRESSION: 1. Low lung volumes without radiographic evidence of acute cardiopulmonary disease.   Original Report Authenticated By: Trudie Reed, M.D.    Assessment: I called the [facility] in [place], [place] and his synovial fluid cultures are negative at 48 hours. Operative cultures from here are also negative  so far. Gram stain showed gram-positive cocci so I will continue empiric linezolid. I will last the case manager to try to get preauthorization from his insurance company.  Plan: 1. Continue linezolid  pending final culture results  Cliffton Asters, MD [facility] for Infectious Disease [facility] [phone] pager   [phone] cell 02/06/2013, 11:03 AM

## 2013-02-06 NOTE — Progress Notes (Signed)
    Subjective: 1 Day Post-Op Procedure(s) (LRB): KNEE ARTHROSCOPY   (Left) IRRIGATION AND DEBRIDEMENT KNEE patella bursa Patient reports pain as 6 on 0-10 scale.   Denies CP or SOB.  Voiding without difficulty. Positive flatus. Objective: Vital signs in last 24 hours: Temp:  [97.4 F (36.3 C)-98.7 F (37.1 C)] 97.8 F (36.6 C) (07/23 1321) Pulse Rate:  [66-101] 87 (07/23 1321) Resp:  [16-21] 16 (07/23 1321) BP: (113-123)/(58-77) 113/66 mmHg (07/23 1321) SpO2:  [93 %-100 %] 97 % (07/23 1321)  Intake/Output from previous day: 07/22 0701 - 07/23 0700 In: 1130 [P.O.:480; I.V.:650] Out: -  Intake/Output this shift: Total I/O In: 420 [P.O.:420] Out: -   Labs:  Recent Labs  02/05/13 1354 02/06/13 0415  HGB 13.5 12.4*    Recent Labs  02/05/13 1354 02/06/13 0415  WBC 11.9* 12.3*  RBC 4.54 4.18*  HCT 39.9 36.3*  PLT 232 234    Recent Labs  02/05/13 1354 02/06/13 0415  NA 132* 136  K 3.8 4.1  CL 97 101  CO2 21 26  BUN 8 10  CREATININE 1.07 0.95  GLUCOSE 215* 165*  CALCIUM 8.9 9.0   No results found for this basename: LABPT, INR,  in the last 72 hours  Physical Exam: Neurologically intact ABD soft No cellulitis present Compartment soft  Assessment/Plan: 1 Day Post-Op Procedure(s) (LRB): KNEE ARTHROSCOPY   (Left) IRRIGATION AND DEBRIDEMENT KNEE patella bursa Cont ABX per ID Wound healing well NO significant drainage Cellulitis responding WBAT with crutchs/walker   Donn Zanetti D for Dr. Venita Lick [facility] [phone] 02/06/2013, 1:45 PM

## 2013-02-06 NOTE — Progress Notes (Signed)
Pharmacy Clarification:  32 yo male, BMI 31.7 on VTE proph lovenox s/p op   H/H 12.4/36.3, plt 234, CrCl >100  Plan -adjusted Lovenox for BMI>30 to 0.5mg /kg to 55mg  daily.   Shelba Flake Achilles Dunk, PharmD Clinical Pharmacist - Resident Pager: [phone] Pharmacy: [phone] 02/06/2013 8:48 AM

## 2013-02-06 NOTE — Progress Notes (Signed)
[facility] PROGRESS NOTE  Nathaniel [name] ZOX:[id number] DOB: [birth year] DOA: 02/05/2013 PCP: Alinda Deem, MD  Assessment/Plan:  abcess of patella bursa: s/p I&D  Cellulitis improving    Tobacco abuse    HTN (hypertension)    Hyperglycemia: no h/o DM    COPD exacerbation  D/c steroids. Check Hgb a1c. Schedule bronchodilators. Quit smoking. Await cultures  Consultants:  Ortho  id  Procedures:  I&D left knee patellar bursa, arthroscopy  Antibiotics:  zyvox 7/22  HPI/Subjective: Pain fairly severe. Concerned about copays. Wants diet liberalized. No dyspnea.  Objective: Filed Vitals:   02/06/13 0328 02/06/13 0558 02/06/13 0913 02/06/13 1321  BP: 113/58 120/66  113/66  Pulse: 78 66  87  Temp: 98.7 F (37.1 C) 97.4 F (36.3 C)  97.8 F (36.6 C)  TempSrc: Oral Oral  Oral  Resp: 18 16  16   Height:      Weight:      SpO2: 96% 98% 100% 97%    Intake/Output Summary (Last 24 hours) at 02/06/13 1555 Last data filed at 02/06/13 0853  Gross per 24 hour  Intake    900 ml  Output      0 ml  Net    900 ml   Filed Weights   02/05/13 0329  Weight: 108.863 kg (240 lb)    Exam:   General:  Nontoxic. Eating lunch  Cardiovascular: RRR without MGR  Respiratory: CTA without WRR  Abdomen: soft, nontender  Ext: minimal cellulitis. Dressing CDI  Data Reviewed: Basic Metabolic Panel:  Recent Labs Lab 02/05/13 1354 02/06/13 0415  NA 132* 136  K 3.8 4.1  CL 97 101  CO2 21 26  GLUCOSE 215* 165*  BUN 8 10  CREATININE 1.07 0.95  CALCIUM 8.9 9.0   Liver Function Tests: No results found for this basename: AST, ALT, ALKPHOS, BILITOT, PROT, ALBUMIN,  in the last 168 hours No results found for this basename: LIPASE, AMYLASE,  in the last 168 hours No results found for this basename: AMMONIA,  in the last 168 hours CBC:  Recent Labs Lab 02/05/13 1354 02/06/13 0415  WBC 11.9* 12.3*  HGB 13.5 12.4*  HCT 39.9 36.3*  MCV 87.9 86.8  PLT 232 234    Cardiac Enzymes: No results found for this basename: CKTOTAL, CKMB, CKMBINDEX, TROPONINI,  in the last 168 hours BNP (last 3 results) No results found for this basename: PROBNP,  in the last 8760 hours CBG: No results found for this basename: GLUCAP,  in the last 168 hours  Recent Results (from the past 240 hour(s))  WOUND CULTURE     Status: None   Collection Time    02/05/13  7:20 AM      Result Value Range Status   Specimen Description WOUND LEFT KNEE   Final   Special Requests NUMBER ONE,PT ON CLEOCYIN   Final   Gram Stain PENDING   Incomplete   Culture Culture reincubated for better growth   Final   Report Status PENDING   Incomplete  ANAEROBIC CULTURE     Status: None   Collection Time    02/05/13  7:20 AM      Result Value Range Status   Specimen Description WOUND LEFT KNEE   Final   Special Requests NUMBER 1,PT ON CLEOCYIN   Final   Gram Stain     Final   Value: RARE WBC PRESENT, PREDOMINANTLY PMN     RARE SQUAMOUS EPITHELIAL CELLS PRESENT     RARE GRAM POSITIVE  COCCI     IN PAIRS   Culture     Final   Value: NO ANAEROBES ISOLATED; CULTURE IN PROGRESS FOR 5 DAYS   Report Status PENDING   Incomplete  GRAM STAIN     Status: None   Collection Time    02/05/13  7:20 AM      Result Value Range Status   Specimen Description WOUND LEFT KNEE   Final   Special Requests NUMBER 1,PT ON CLEOCYIN   Final   Gram Stain     Final   Value: MODERATE WBC PRESENT,BOTH PMN AND MONONUCLEAR     RARE GRAM POSITIVE COCCI IN PAIRS IN CLUSTERS     Gram Stain Report Called to,Read Back By and Verified With: S.GREGSON,RN 02/05/13 0820 BY BSLADE   Report Status 02/05/2013 FINAL   Final  WOUND CULTURE     Status: None   Collection Time    02/05/13  7:24 AM      Result Value Range Status   Specimen Description WOUND LEFT KNEE   Final   Special Requests NUMBER 2,PT ON CLEOCYIN    Final   Gram Stain PENDING   Incomplete   Culture NO GROWTH 1 DAY   Final   Report Status PENDING   Incomplete   ANAEROBIC CULTURE     Status: None   Collection Time    02/05/13  7:24 AM      Result Value Range Status   Specimen Description WOUND LEFT KNEE   Final   Special Requests NUMBER 2,PT ON CLEOCYIN   Final   Gram Stain     Final   Value: NO WBC SEEN     NO SQUAMOUS EPITHELIAL CELLS SEEN     NO ORGANISMS SEEN   Culture     Final   Value: NO ANAEROBES ISOLATED; CULTURE IN PROGRESS FOR 5 DAYS   Report Status PENDING   Incomplete  GRAM STAIN     Status: None   Collection Time    02/05/13  7:24 AM      Result Value Range Status   Specimen Description WOUND LEFT KNEE   Final   Special Requests NUMBER 2,PT ON CLEOCYIN   Final   Gram Stain     Final   Value: RARE WBC PRESENT,BOTH PMN AND MONONUCLEAR     NO ORGANISMS SEEN     Gram Stain Report Called to,Read Back By and Verified With: S.GREGSON,RN 02/05/13 0820 BY BSLADE   Report Status 02/05/2013 FINAL   Final     Studies: Dg Chest Port 1 View  02/05/2013   *RADIOLOGY REPORT*  Clinical Data: Tachypnea.  Wheezing.  PORTABLE CHEST - 1 VIEW  Comparison: Chest x-ray 11/08/2012.  Findings: Lung volumes are slightly low.  No consolidative airspace disease.  No pleural effusions.  No pneumothorax.  No pulmonary nodule or mass noted.  Pulmonary vasculature and the cardiomediastinal silhouette are within normal limits.  IMPRESSION: 1. Low lung volumes without radiographic evidence of acute cardiopulmonary disease.   Original Report Authenticated By: Trudie Reed, M.D.    Scheduled Meds: . aspirin EC  81 mg Oral Daily  . budesonide-formoterol  2 puff Inhalation BID  . citalopram  40 mg Oral Daily  . enoxaparin (LOVENOX) injection  55 mg Subcutaneous Q24H  . linezolid  600 mg Oral Q12H  . lisinopril  10 mg Oral Daily  . methylPREDNISolone (SOLU-MEDROL) injection  60 mg Intravenous Q6H  . metoprolol tartrate  50 mg Oral Daily  .  nicotine  21 mg Transdermal Daily  . pantoprazole  40 mg Oral Daily   Continuous Infusions: . lactated ringers 85  mL/hr at 02/06/13 1025     Time spent: 35 min  Juliana Boling L  [facility] Pager [phone]. If 7PM-7AM, please contact night-coverage at [email or web address], password [facility] 02/06/2013, 3:55 PM  LOS: 1 day

## 2013-02-07 LAB — BASIC METABOLIC PANEL
BUN: 13 mg/dL (ref 6–23)
CO2: 27 mEq/L (ref 19–32)
Calcium: 8.9 mg/dL (ref 8.4–10.5)
GFR calc non Af Amer: >90 mL/min (ref >90)
Glucose, Bld: 118 mg/dL — ABNORMAL HIGH (ref 70–99)

## 2013-02-07 LAB — CBC
Hemoglobin: 12.1 g/dL — ABNORMAL LOW (ref 13.0–17.0)
MCH: 30.2 pg (ref 26.0–34.0)
MCHC: 34.4 g/dL (ref 30.0–36.0)
MCV: 87.8 fL (ref 78.0–100.0)

## 2013-02-07 LAB — GLUCOSE, CAPILLARY: Glucose-Capillary: 96 mg/dL (ref 70–99)

## 2013-02-07 LAB — WOUND CULTURE

## 2013-02-07 NOTE — Progress Notes (Addendum)
    Subjective: 2 Days Post-Op Procedure(s) (LRB): KNEE ARTHROSCOPY   (Left) IRRIGATION AND DEBRIDEMENT KNEE patella bursa Patient reports pain as 5 on 0-10 scale.   Denies CP or SOB.  Voiding without difficulty. Positive flatus. Objective: Vital signs in last 24 hours: Temp:  [97.7 F (36.5 C)-98 F (36.7 C)] 98 F (36.7 C) (07/24 0647) Pulse Rate:  [80-87] 80 (07/24 0647) Resp:  [16-18] 18 (07/24 0647) BP: (113-123)/(59-78) 119/78 mmHg (07/24 0647) SpO2:  [96 %-100 %] 98 % (07/24 0741)  Intake/Output from previous day: 07/23 0701 - 07/24 0700 In: 4068.8 [P.O.:1200; I.V.:2868.8] Out: -  Intake/Output this shift:    Labs:  Recent Labs  02/05/13 1354 02/06/13 0415 02/07/13 0520  HGB 13.5 12.4* 12.1*    Recent Labs  02/06/13 0415 02/07/13 0520  WBC 12.3* 17.3*  RBC 4.18* 4.01*  HCT 36.3* 35.2*  PLT 234 264    Recent Labs  02/06/13 0415 02/07/13 0520  NA 136 138  K 4.1 4.3  CL 101 104  CO2 26 27  BUN 10 13  CREATININE 0.95 0.83  GLUCOSE 165* 118*  CALCIUM 9.0 8.9   No results found for this basename: LABPT, INR,  in the last 72 hours  Physical Exam: Neurologically intact Intact pulses distally Compartment soft cellulitis improved Patella wound: no drainage, no foul odor   Assessment/Plan: 2 Days Post-Op Procedure(s) (LRB): KNEE ARTHROSCOPY   (Left) IRRIGATION AND DEBRIDEMENT KNEE patella bursa Up with therapy Plan:   1. BID packing to patella wound - awaiting wound nurse eval.  for other recommendations.   2. Will require f/u in the [facility] wound clinic shortly after D/C to continue to monitor wound healing 3. ABX management per ID service 4. Continue to encourage mobilization. Ok to [place] with assistive device 5. 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  Would recommend he not return to that job until wound has healed.   6. Will also require BID dressing changes after D/C - either HH nsg or  teaching family to do dressing changes. 7. F/U with me shortly after d/c to check wound I will be out of town, but my partner will be available to address any questions:  Dr. Shelle Iron today and Friday;  then on call physician over the week-end.  Contact number is [phone].    Venita Lick D for Dr. Venita Lick [facility] [phone] 02/07/2013, 8:53 AM

## 2013-02-07 NOTE — Progress Notes (Signed)
Patient ID: Nathaniel [name], male   DOB: Jul 10, 1981, 32 y.o.   MRN: [id number]         [facility] for Infectious Disease    Date of Admission:  02/05/2013   Total days of antibiotics 4        Day 2 linezolid         Principal Problem:   Septic arthritis of knee Active Problems:   Tobacco abuse   HTN (hypertension)   Hyperglycemia   COPD exacerbation   . aspirin EC  81 mg Oral Daily  . budesonide-formoterol  2 puff Inhalation BID  . citalopram  40 mg Oral Daily  . enoxaparin (LOVENOX) injection  55 mg Subcutaneous Q24H  . Ipratropium-Albuterol  2 puff Inhalation QID  . linezolid  600 mg Oral Q12H  . lisinopril  10 mg Oral Daily  . metoprolol tartrate  50 mg Oral Daily  . nicotine  21 mg Transdermal Daily  . pantoprazole  40 mg Oral Daily    Subjective: He is feeling better with less knee pain. He is eager to go home. Review of Systems: Pertinent items are noted in HPI.  Past Medical History  Diagnosis Date  . COPD (chronic obstructive pulmonary disease)   . Bell's palsy   . IBS (irritable bowel syndrome)   . Hypertension     History  Substance Use Topics  . Smoking status: Current Every Day Smoker -- 0.50 packs/day for 12 years    Types: Cigarettes  . Smokeless tobacco: Never Used  . Alcohol Use: No    History reviewed. No pertinent family history.  Allergies  Allergen Reactions  . Flagyl (Metronidazole) Swelling  . Latex Hives  . Penicillins Swelling  . Phenergan (Promethazine Hcl) Other (See Comments)    Seizure  . Vancomycin Swelling    Objective: Temp:  [97.7 F (36.5 C)-98 F (36.7 C)] 98 F (36.7 C) (07/24 0647) Pulse Rate:  [80-86] 80 (07/24 0647) Resp:  [18] 18 (07/24 0647) BP: (119-123)/(59-78) 119/78 mmHg (07/24 0647) SpO2:  [96 %-98 %] 98 % (07/24 0741)  General: He is more comfortable sitting up in a chair. His left knee drain has been removed.  Lab Results Lab Results  Component Value Date   WBC 17.3* 02/07/2013   HGB 12.1*  02/07/2013   HCT 35.2* 02/07/2013   MCV 87.8 02/07/2013   PLT 264 02/07/2013    Lab Results  Component Value Date   CREATININE 0.83 02/07/2013   BUN 13 02/07/2013   NA 138 02/07/2013   K 4.3 02/07/2013   CL 104 02/07/2013   CO2 27 02/07/2013    Lab Results  Component Value Date   ALT 38 07/18/2012   AST 36 07/18/2012   ALKPHOS 116 07/18/2012   BILITOT 0.6 07/18/2012      Microbiology: Recent Results (from the past 240 hour(s))  WOUND CULTURE     Status: None   Collection Time    02/05/13  7:20 AM      Result Value Range Status   Specimen Description WOUND LEFT KNEE   Final   Special Requests NUMBER ONE,PT ON CLEOCYIN   Final   Gram Stain     Final   Value: RARE WBC PRESENT, PREDOMINANTLY PMN     RARE SQUAMOUS EPITHELIAL CELLS PRESENT     RARE GRAM POSITIVE COCCI     IN PAIRS   Culture     Final   Value: FEW STAPHYLOCOCCUS AUREUS     Note:  RIFAMPIN AND GENTAMICIN SHOULD NOT BE USED AS SINGLE DRUGS FOR TREATMENT OF STAPH INFECTIONS.   Report Status PENDING   Incomplete  ANAEROBIC CULTURE     Status: None   Collection Time    02/05/13  7:20 AM      Result Value Range Status   Specimen Description WOUND LEFT KNEE   Final   Special Requests NUMBER 1,PT ON CLEOCYIN   Final   Gram Stain     Final   Value: RARE WBC PRESENT, PREDOMINANTLY PMN     RARE SQUAMOUS EPITHELIAL CELLS PRESENT     RARE GRAM POSITIVE COCCI     IN PAIRS   Culture     Final   Value: NO ANAEROBES ISOLATED; CULTURE IN PROGRESS FOR 5 DAYS   Report Status PENDING   Incomplete  GRAM STAIN     Status: None   Collection Time    02/05/13  7:20 AM      Result Value Range Status   Specimen Description WOUND LEFT KNEE   Final   Special Requests NUMBER 1,PT ON CLEOCYIN   Final   Gram Stain     Final   Value: MODERATE WBC PRESENT,BOTH PMN AND MONONUCLEAR     RARE GRAM POSITIVE COCCI IN PAIRS IN CLUSTERS     Gram Stain Report Called to,Read Back By and Verified With: S.GREGSON,RN 02/05/13 0820 BY BSLADE   Report Status  02/05/2013 FINAL   Final  WOUND CULTURE     Status: None   Collection Time    02/05/13  7:24 AM      Result Value Range Status   Specimen Description WOUND LEFT KNEE   Final   Special Requests NUMBER 2,PT ON CLEOCYIN    Final   Gram Stain     Final   Value: NO WBC SEEN     NO SQUAMOUS EPITHELIAL CELLS SEEN     NO ORGANISMS SEEN   Culture NO GROWTH 2 DAYS   Final   Report Status 02/07/2013 FINAL   Final  ANAEROBIC CULTURE     Status: None   Collection Time    02/05/13  7:24 AM      Result Value Range Status   Specimen Description WOUND LEFT KNEE   Final   Special Requests NUMBER 2,PT ON CLEOCYIN   Final   Gram Stain     Final   Value: NO WBC SEEN     NO SQUAMOUS EPITHELIAL CELLS SEEN     NO ORGANISMS SEEN   Culture     Final   Value: NO ANAEROBES ISOLATED; CULTURE IN PROGRESS FOR 5 DAYS   Report Status PENDING   Incomplete  GRAM STAIN     Status: None   Collection Time    02/05/13  7:24 AM      Result Value Range Status   Specimen Description WOUND LEFT KNEE   Final   Special Requests NUMBER 2,PT ON CLEOCYIN   Final   Gram Stain     Final   Value: RARE WBC PRESENT,BOTH PMN AND MONONUCLEAR     NO ORGANISMS SEEN     Gram Stain Report Called to,Read Back By and Verified With: S.GREGSON,RN 02/05/13 0820 BY BSLADE   Report Status 02/05/2013 FINAL   Final    Studies/Results: No results found.  Assessment: I called [facility], [place] and all cultures are from his left knee are negative and final. His operative cultures here were labeled specimen 1 and specimen 2. Specimen  1 is growing staph aureus with susceptibilities pending. Specimen 2 is negative. From the operative note it appears that his synovial fluid was nonpurulent but he had an infrapatellar abscess was drained. Regardless of whether he has septic arthritis or peripatellar abscess the treatment will be similar. Because of his allergies to penicillin and vancomycin I am treating him with oral linezolid. We are hoping to get  approval for outpatient linezolid through the patient assistance program.  Plan: 1. Continue linezolid for 3 weeks 2. Hopefully discharge in a.m. 3. I will arrange followup in my clinic  Cliffton Asters, MD [facility] for Infectious Disease [facility] [phone] pager   [phone] cell 02/07/2013, 2:24 PM

## 2013-02-07 NOTE — Progress Notes (Signed)
Just got call back from [employer] re: eligibility for assistance. Pt has a 70$ copay but is eligible for their pt assistance card.  This should reduce the copay to anywhere from $0 - $15.  The [employer] rep was uncertain of the exact amount.  Info for card is below.  Tomorrow, will need the Rx for the Zyvox and will walk with wife to [facility] here at main campus to fill.  Wife and pt both felt they could meet a $15 copay.  BIN:  [id number] Group #:  [id number] ID#:  [id number]  For issues or questions:  [id number]

## 2013-02-07 NOTE — Progress Notes (Signed)
[facility] PROGRESS NOTE  Nathaniel [name] WUJ:[id number] DOB: Oct 05, 1980 DOA: 02/05/2013 PCP: Alinda Deem, MD  Assessment/Plan:  abcess of patella bursa: s/p I&D  Cellulitis improving    Tobacco abuse    HTN (hypertension)    Hyperglycemia: resolved. hgb a1c ok. D/c cbgs    COPD exacerbation resolved  Likely home tomorrow  Consultants:  Ortho  id  Procedures:  I&D left knee patellar bursa, arthroscopy  Antibiotics:  zyvox 7/22  HPI/Subjective: Pain better. No new c/o  Objective: Filed Vitals:   02/06/13 2056 02/06/13 2133 02/07/13 0647 02/07/13 0741  BP:  123/59 119/78   Pulse:  86 80   Temp:  97.7 F (36.5 C) 98 F (36.7 C)   TempSrc:  Oral Oral   Resp:  18 18   Height:      Weight:      SpO2: 96% 97% 97% 98%    Intake/Output Summary (Last 24 hours) at 02/07/13 1425 Last data filed at 02/07/13 0900  Gross per 24 hour  Intake 3528.75 ml  Output      0 ml  Net 3528.75 ml   Filed Weights   02/05/13 0329  Weight: 108.863 kg (240 lb)    Exam:   General:  Nontoxic. Eating lunch  Cardiovascular: RRR without MGR  Respiratory: CTA without WRR  Abdomen: soft, nontender  Ext: minimal cellulitis. Dressing CDI  Data Reviewed: Basic Metabolic Panel:  Recent Labs Lab 02/05/13 1354 02/06/13 0415 02/07/13 0520  NA 132* 136 138  K 3.8 4.1 4.3  CL 97 101 104  CO2 21 26 27   GLUCOSE 215* 165* 118*  BUN 8 10 13   CREATININE 1.07 0.95 0.83  CALCIUM 8.9 9.0 8.9   Liver Function Tests: No results found for this basename: AST, ALT, ALKPHOS, BILITOT, PROT, ALBUMIN,  in the last 168 hours No results found for this basename: LIPASE, AMYLASE,  in the last 168 hours No results found for this basename: AMMONIA,  in the last 168 hours CBC:  Recent Labs Lab 02/05/13 1354 02/06/13 0415 02/07/13 0520  WBC 11.9* 12.3* 17.3*  HGB 13.5 12.4* 12.1*  HCT 39.9 36.3* 35.2*  MCV 87.9 86.8 87.8  PLT 232 234 264   Cardiac Enzymes: No results  found for this basename: CKTOTAL, CKMB, CKMBINDEX, TROPONINI,  in the last 168 hours BNP (last 3 results) No results found for this basename: PROBNP,  in the last 8760 hours CBG:  Recent Labs Lab 02/06/13 1706 02/06/13 2128 02/07/13 0805 02/07/13 1213  GLUCAP 179* 156* 102* 96    Recent Results (from the past 240 hour(s))  WOUND CULTURE     Status: None   Collection Time    02/05/13  7:20 AM      Result Value Range Status   Specimen Description WOUND LEFT KNEE   Final   Special Requests NUMBER ONE,PT ON CLEOCYIN   Final   Gram Stain     Final   Value: RARE WBC PRESENT, PREDOMINANTLY PMN     RARE SQUAMOUS EPITHELIAL CELLS PRESENT     RARE GRAM POSITIVE COCCI     IN PAIRS   Culture     Final   Value: FEW STAPHYLOCOCCUS AUREUS     Note: RIFAMPIN AND GENTAMICIN SHOULD NOT BE USED AS SINGLE DRUGS FOR TREATMENT OF STAPH INFECTIONS.   Report Status PENDING   Incomplete  ANAEROBIC CULTURE     Status: None   Collection Time    02/05/13  7:20 AM  Result Value Range Status   Specimen Description WOUND LEFT KNEE   Final   Special Requests NUMBER 1,PT ON CLEOCYIN   Final   Gram Stain     Final   Value: RARE WBC PRESENT, PREDOMINANTLY PMN     RARE SQUAMOUS EPITHELIAL CELLS PRESENT     RARE GRAM POSITIVE COCCI     IN PAIRS   Culture     Final   Value: NO ANAEROBES ISOLATED; CULTURE IN PROGRESS FOR 5 DAYS   Report Status PENDING   Incomplete  GRAM STAIN     Status: None   Collection Time    02/05/13  7:20 AM      Result Value Range Status   Specimen Description WOUND LEFT KNEE   Final   Special Requests NUMBER 1,PT ON CLEOCYIN   Final   Gram Stain     Final   Value: MODERATE WBC PRESENT,BOTH PMN AND MONONUCLEAR     RARE GRAM POSITIVE COCCI IN PAIRS IN CLUSTERS     Gram Stain Report Called to,Read Back By and Verified With: S.GREGSON,RN 02/05/13 0820 BY BSLADE   Report Status 02/05/2013 FINAL   Final  WOUND CULTURE     Status: None   Collection Time    02/05/13  7:24 AM       Result Value Range Status   Specimen Description WOUND LEFT KNEE   Final   Special Requests NUMBER 2,PT ON CLEOCYIN    Final   Gram Stain     Final   Value: NO WBC SEEN     NO SQUAMOUS EPITHELIAL CELLS SEEN     NO ORGANISMS SEEN   Culture NO GROWTH 2 DAYS   Final   Report Status 02/07/2013 FINAL   Final  ANAEROBIC CULTURE     Status: None   Collection Time    02/05/13  7:24 AM      Result Value Range Status   Specimen Description WOUND LEFT KNEE   Final   Special Requests NUMBER 2,PT ON CLEOCYIN   Final   Gram Stain     Final   Value: NO WBC SEEN     NO SQUAMOUS EPITHELIAL CELLS SEEN     NO ORGANISMS SEEN   Culture     Final   Value: NO ANAEROBES ISOLATED; CULTURE IN PROGRESS FOR 5 DAYS   Report Status PENDING   Incomplete  GRAM STAIN     Status: None   Collection Time    02/05/13  7:24 AM      Result Value Range Status   Specimen Description WOUND LEFT KNEE   Final   Special Requests NUMBER 2,PT ON CLEOCYIN   Final   Gram Stain     Final   Value: RARE WBC PRESENT,BOTH PMN AND MONONUCLEAR     NO ORGANISMS SEEN     Gram Stain Report Called to,Read Back By and Verified With: S.GREGSON,RN 02/05/13 0820 BY BSLADE   Report Status 02/05/2013 FINAL   Final     Studies: No results found.  Scheduled Meds: . aspirin EC  81 mg Oral Daily  . budesonide-formoterol  2 puff Inhalation BID  . citalopram  40 mg Oral Daily  . enoxaparin (LOVENOX) injection  55 mg Subcutaneous Q24H  . Ipratropium-Albuterol  2 puff Inhalation QID  . linezolid  600 mg Oral Q12H  . lisinopril  10 mg Oral Daily  . metoprolol tartrate  50 mg Oral Daily  . nicotine  21 mg Transdermal  Daily  . pantoprazole  40 mg Oral Daily   Continuous Infusions:     Time spent: 15 min  Melton Walls L  [facility] Pager [phone]. If 7PM-7AM, please contact night-coverage at [email or web address], password [facility] 02/07/2013, 2:25 PM  LOS: 2 days

## 2013-02-07 NOTE — Progress Notes (Signed)
Order for home health RN for dressing changes . Patient picked [facility] , however is very concerned with co pay . Called [facility] co pay would be around $20 per visit . Number of visits would be determined by patient / family comfort level with changing dressings.  Explained this to patient and family . Patient has  2 other family   members who are CNA's and will call them today to see if they are available to assit with changing dressing .  Patient will decide tomorrow if he wants home health .   Facesheet information incorrect : [address] , [place] [address] , phone [phone] ( called admitting to have changed in system )   Appointment at [facility] set for Monday August 11 at 1000 arrive at 0945 . Patient and family aware . Will also add to discharge instruction sheet    Ronny Flurry RN BSN [phone]

## 2013-02-07 NOTE — Anesthesia Postprocedure Evaluation (Signed)
  Anesthesia Post-op Note  Patient: Nathaniel [name]  Procedure(s) Performed: Procedure(s): KNEE ARTHROSCOPY   (Left) IRRIGATION AND DEBRIDEMENT KNEE patella bursa  Patient Location: Nursing Unit  Anesthesia Type:General  Level of Consciousness: awake  Airway and Oxygen Therapy: Patient Spontanous Breathing and Patient connected to nasal cannula oxygen  Post-op Pain: mild  Post-op Assessment: Post-op Vital signs reviewed and Patient's Cardiovascular Status Stable  Post-op Vital Signs: Reviewed and stable  Complications: No apparent anesthesia complications

## 2013-02-07 NOTE — Consult Note (Signed)
WOC consult Note Reason for Consult: evaluation for wound care for the left knee, s/p I&D  Wound type: surgical Measurement: 1 open area with some intact sutures in place laterally Drainage (amount, consistency, odor) serous Periwound:some erythema but seems resolved  Dressing procedure/placement/frequency: continue iodoform packing, increase to BID.  HHRN to teach family dressing.  Re consult if needed, will not follow at this time. Thanks  Katreena Schupp [employer], CWOCN [phone])

## 2013-02-08 DIAGNOSIS — M7040 Prepatellar bursitis, unspecified knee: Secondary | ICD-10-CM

## 2013-02-08 DIAGNOSIS — A4901 Methicillin susceptible Staphylococcus aureus infection, unspecified site: Secondary | ICD-10-CM

## 2013-02-08 LAB — WOUND CULTURE

## 2013-02-08 MED ORDER — OXYCODONE-ACETAMINOPHEN 5-325 MG PO TABS: 1.0000 | ORAL_TABLET | ORAL | Status: DC | PRN

## 2013-02-08 MED ORDER — LINEZOLID 600 MG PO TABS: 600.0000 mg | ORAL_TABLET | Freq: Two times a day (BID) | ORAL | Status: DC

## 2013-02-08 NOTE — Discharge Summary (Signed)
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 Work up there included a WBC of 11K, with normal Hb, and normal renal fx tests. His knee xray showed effusion and subcutaneous inflammation. His left knee arthrocentesis showed 55K WBC, and GS showed rare GPCs. Hospitalist at [facility] was consulted, but there were no orthopedics there for consultation. Dr Shon Baton were consulted by EDP and will see this patient in consultation. I have accepted him in transfer. The duration of his illness was about 5 days, and he didn't recall any inciting event. His last DT was 2 years ago.   Hospital Course:  Patient has vancomycin allergy. Started on zyvox. orhto and ID consulted. I&D performed.  CX showed MRSA.  Pt refused home health RN, so wife taught woundcare.    Procedures: arthroscopic I&D of left knee and direct I&D of infrapatellar abscess by Dr. Shon Baton 02/06/13.  ID recommends oral linezolid for 3 weeks.  Consultations:  Toy Cookey  Discharge Exam: Filed Vitals:   02/07/13 2225 02/08/13 0704 02/08/13 0832 02/08/13 1115  BP: 109/61 112/77    Pulse: 71 74    Temp: 97.9 F (36.6 C) 97.9 F (36.6 C)    TempSrc: Axillary Oral    Resp: 20 18    Height:      Weight:      SpO2: 97% 94% 98% 98%    General:  nontoxic.  comfortable Ext:  Dressing CDI.  Discharge Instructions  Discharge Orders   Future Orders Complete By Expires     Discharge wound care:  As directed     Comments:      Pack wound twice daily and cover with gauze. Keep wounds clean and dry. May shower.    Increase activity slowly  As directed         Medication List         citalopram 20 MG tablet  Commonly known as:  CELEXA  Take 40 mg by mouth daily.     linezolid 600 MG tablet  Commonly known as:  ZYVOX  Take 1 tablet (600 mg total) by mouth every 12 (twelve) hours.     lisinopril 10 MG tablet  Commonly known as:  PRINIVIL,ZESTRIL  Take 10 mg by mouth daily.     metoprolol tartrate 25 MG tablet  Commonly known as:  LOPRESSOR  Take 50 mg by mouth daily.     omeprazole 20 MG capsule  Commonly known as:  PRILOSEC  Take 20 mg by mouth daily.     oxyCODONE-acetaminophen 5-325 MG per tablet  Commonly known as:  ROXICET  Take 1-2 tablets by mouth every 4 (four) hours as needed for pain.       Allergies  Allergen Reactions  . Flagyl (Metronidazole) Swelling  . Latex Hives  . Penicillins Swelling  .  Phenergan (Promethazine Hcl) Other (See Comments)    Seizure  . Vancomycin Swelling       Follow-up Information   Follow up with Alvy Beal, MD In 3 days. (3-5 days after discharge need to be seen)    Contact information:   [address] [place] [address] [phone]       Follow up with Cliffton Asters, MD. (his office will call you with appointment)    Contact information:   [address] [place] [address] [phone]        The results of significant diagnostics from this hospitalization (including imaging, microbiology, ancillary and laboratory) are listed below for reference.    Significant Diagnostic Studies: Dg Chest Port 1 View  02/05/2013   *RADIOLOGY REPORT*  Clinical Data: Tachypnea.  Wheezing.  PORTABLE CHEST - 1 VIEW  Comparison: Chest  x-ray 11/08/2012.  Findings: Lung volumes are slightly low.  No consolidative airspace disease.  No pleural effusions.  No pneumothorax.  No pulmonary nodule or mass noted.  Pulmonary vasculature and the cardiomediastinal silhouette are within normal limits.  IMPRESSION: 1. Low lung volumes without radiographic evidence of acute cardiopulmonary disease.   Original Report Authenticated By: Trudie Reed, M.D.    Microbiology: Recent Results (from the past 240 hour(s))  WOUND CULTURE     Status: None   Collection Time    02/05/13  7:20 AM      Result Value Range Status   Specimen Description WOUND LEFT KNEE   Final   Special Requests NUMBER ONE,PT ON CLEOCYIN   Final   Gram Stain     Final   Value: RARE WBC PRESENT, PREDOMINANTLY PMN     RARE SQUAMOUS EPITHELIAL CELLS PRESENT     RARE GRAM POSITIVE COCCI     IN PAIRS   Culture     Final   Value: FEW METHICILLIN RESISTANT STAPHYLOCOCCUS AUREUS     Note: RIFAMPIN AND GENTAMICIN SHOULD NOT BE USED AS SINGLE DRUGS FOR TREATMENT OF STAPH INFECTIONS. CRITICAL RESULT CALLED TO, READ BACK BY AND VERIFIED WITH: LINDSAY MOSS 02/08/13 AT 1035 AM BY [facility]   Report Status 02/08/2013 FINAL   Final   Organism ID, Bacteria METHICILLIN RESISTANT STAPHYLOCOCCUS AUREUS   Final  ANAEROBIC CULTURE     Status: None   Collection Time    02/05/13  7:20 AM      Result Value Range Status   Specimen Description WOUND LEFT KNEE   Final   Special Requests NUMBER 1,PT ON CLEOCYIN   Final   Gram Stain     Final   Value: RARE WBC PRESENT, PREDOMINANTLY PMN     RARE SQUAMOUS EPITHELIAL CELLS PRESENT     RARE GRAM POSITIVE COCCI     IN PAIRS   Culture     Final   Value: NO ANAEROBES ISOLATED; CULTURE IN PROGRESS FOR 5 DAYS   Report Status PENDING   Incomplete  GRAM STAIN     Status: None   Collection Time    02/05/13  7:20 AM      Result Value Range Status   Specimen Description WOUND LEFT KNEE   Final   Special Requests NUMBER 1,PT ON CLEOCYIN   Final   Gram  Stain     Final   Value: MODERATE WBC PRESENT,BOTH PMN AND MONONUCLEAR     RARE GRAM POSITIVE COCCI IN PAIRS IN CLUSTERS     Gram Stain Report Called to,Read Back By and  Verified With: S.GREGSON,RN 02/05/13 0820 BY BSLADE   Report Status 02/05/2013 FINAL   Final  WOUND CULTURE     Status: None   Collection Time    02/05/13  7:24 AM      Result Value Range Status   Specimen Description WOUND LEFT KNEE   Final   Special Requests NUMBER 2,PT ON CLEOCYIN    Final   Gram Stain     Final   Value: NO WBC SEEN     NO SQUAMOUS EPITHELIAL CELLS SEEN     NO ORGANISMS SEEN   Culture NO GROWTH 2 DAYS   Final   Report Status 02/07/2013 FINAL   Final  ANAEROBIC CULTURE     Status: None   Collection Time    02/05/13  7:24 AM      Result Value Range Status   Specimen Description WOUND LEFT KNEE   Final   Special Requests NUMBER 2,PT ON CLEOCYIN   Final   Gram Stain     Final   Value: NO WBC SEEN     NO SQUAMOUS EPITHELIAL CELLS SEEN     NO ORGANISMS SEEN   Culture     Final   Value: NO ANAEROBES ISOLATED; CULTURE IN PROGRESS FOR 5 DAYS   Report Status PENDING   Incomplete  GRAM STAIN     Status: None   Collection Time    02/05/13  7:24 AM      Result Value Range Status   Specimen Description WOUND LEFT KNEE   Final   Special Requests NUMBER 2,PT ON CLEOCYIN   Final   Gram Stain     Final   Value: RARE WBC PRESENT,BOTH PMN AND MONONUCLEAR     NO ORGANISMS SEEN     Gram Stain Report Called to,Read Back By and Verified With: S.GREGSON,RN 02/05/13 0820 BY BSLADE   Report Status 02/05/2013 FINAL   Final     Labs: Basic Metabolic Panel:  Recent Labs Lab 02/05/13 1354 02/06/13 0415 02/07/13 0520  NA 132* 136 138  K 3.8 4.1 4.3  CL 97 101 104  CO2 21 26 27   GLUCOSE 215* 165* 118*  BUN 8 10 13   CREATININE 1.07 0.95 0.83  CALCIUM 8.9 9.0 8.9   Liver Function Tests: No results found for this basename: AST, ALT, ALKPHOS, BILITOT, PROT, ALBUMIN,  in the last 168 hours No results found  for this basename: LIPASE, AMYLASE,  in the last 168 hours No results found for this basename: AMMONIA,  in the last 168 hours CBC:  Recent Labs Lab 02/05/13 1354 02/06/13 0415 02/07/13 0520  WBC 11.9* 12.3* 17.3*  HGB 13.5 12.4* 12.1*  HCT 39.9 36.3* 35.2*  MCV 87.9 86.8 87.8  PLT 232 234 264   Cardiac Enzymes: No results found for this basename: CKTOTAL, CKMB, CKMBINDEX, TROPONINI,  in the last 168 hours BNP: BNP (last 3 results) No results found for this basename: PROBNP,  in the last 8760 hours CBG:  Recent Labs Lab 02/06/13 1706 02/06/13 2128 02/07/13 0805 02/07/13 1213  GLUCAP 179* 156* 102* 96   Signed:  Sharran Caratachea L  [facility] 02/08/2013, 12:57 PM

## 2013-02-08 NOTE — Progress Notes (Signed)
   Subjective: 3 Days Post-Op Procedure(s) (LRB): KNEE ARTHROSCOPY   (Left) IRRIGATION AND DEBRIDEMENT KNEE patella bursa  Left knee with minimal pain and greatly improved erythema Ready for d/c Patient reports pain as mild.  Objective:   VITALS:   Filed Vitals:   02/08/13 0704  BP: 112/77  Pulse: 74  Temp: 97.9 F (36.6 C)  Resp: 18    Right knee incision healing well nv intact distally No edema or erythema  LABS  Recent Labs  02/05/13 1354 02/06/13 0415 02/07/13 0520  HGB 13.5 12.4* 12.1*  HCT 39.9 36.3* 35.2*  WBC 11.9* 12.3* 17.3*  PLT 232 234 264     Recent Labs  02/05/13 1354 02/06/13 0415 02/07/13 0520  NA 132* 136 138  K 3.8 4.1 4.3  BUN 8 10 13   CREATININE 1.07 0.95 0.83  GLUCOSE 215* 165* 118*     Assessment/Plan: 3 Days Post-Op Procedure(s) (LRB): KNEE ARTHROSCOPY   (Left) IRRIGATION AND DEBRIDEMENT KNEE patella bursa  Pt doing well F/u in 2 weeks with [place] ortho Continue antibiotics as directed   [employer], MPAS, PA-C  02/08/2013, 11:15 AM

## 2013-02-08 NOTE — Progress Notes (Addendum)
Lab called with wound culture #1 result (+) MRSA.  MD notified @ 1040 and pt placed on contact precautions.  Nathaniel [name] [place]

## 2013-02-08 NOTE — Progress Notes (Signed)
Patient ID: Nathaniel [name], male   DOB: [birth year], 30 y.o.   MRN: [id number]         [facility] for Infectious Disease    Date of Admission:  02/05/2013   Total days of antibiotics 5        Day 3 linezolid         Principal Problem:   Septic arthritis of knee Active Problems:   Tobacco abuse   HTN (hypertension)   Hyperglycemia   COPD exacerbation   . aspirin EC  81 mg Oral Daily  . budesonide-formoterol  2 puff Inhalation BID  . citalopram  40 mg Oral Daily  . enoxaparin (LOVENOX) injection  55 mg Subcutaneous Q24H  . Ipratropium-Albuterol  2 puff Inhalation QID  . linezolid  600 mg Oral Q12H  . lisinopril  10 mg Oral Daily  . metoprolol tartrate  50 mg Oral Daily  . nicotine  21 mg Transdermal Daily  . pantoprazole  40 mg Oral Daily    Subjective: He is feeling better. Review of Systems: Pertinent items are noted in HPI.  Past Medical History  Diagnosis Date  . COPD (chronic obstructive pulmonary disease)   . Bell's palsy   . IBS (irritable bowel syndrome)   . Hypertension     History  Substance Use Topics  . Smoking status: Current Every Day Smoker -- 0.50 packs/day for 12 years    Types: Cigarettes  . Smokeless tobacco: Never Used  . Alcohol Use: No    History reviewed. No pertinent family history.  Allergies  Allergen Reactions  . Flagyl (Metronidazole) Swelling  . Latex Hives  . Penicillins Swelling  . Phenergan (Promethazine Hcl) Other (See Comments)    Seizure  . Vancomycin Swelling    Objective: Temp:  [97.6 F (36.4 C)-97.9 F (36.6 C)] 97.9 F (36.6 C) (07/25 0704) Pulse Rate:  [71-77] 74 (07/25 0704) Resp:  [18-20] 18 (07/25 0704) BP: (105-112)/(54-77) 112/77 mmHg (07/25 0704) SpO2:  [94 %-99 %] 98 % (07/25 1115)  General: He is up walking from the bathroom to the bed Knee dressing clean and dry  Lab Results Lab Results  Component Value Date   WBC 17.3* 02/07/2013   HGB 12.1* 02/07/2013   HCT 35.2* 02/07/2013   MCV 87.8  02/07/2013   PLT 264 02/07/2013    Lab Results  Component Value Date   CREATININE 0.83 02/07/2013   BUN 13 02/07/2013   NA 138 02/07/2013   K 4.3 02/07/2013   CL 104 02/07/2013   CO2 27 02/07/2013    Lab Results  Component Value Date   ALT 38 07/18/2012   AST 36 07/18/2012   ALKPHOS 116 07/18/2012   BILITOT 0.6 07/18/2012      Microbiology: Recent Results (from the past 240 hour(s))  WOUND CULTURE     Status: None   Collection Time    02/05/13  7:20 AM      Result Value Range Status   Specimen Description WOUND LEFT KNEE   Final   Special Requests NUMBER ONE,PT ON CLEOCYIN   Final   Gram Stain     Final   Value: RARE WBC PRESENT, PREDOMINANTLY PMN     RARE SQUAMOUS EPITHELIAL CELLS PRESENT     RARE GRAM POSITIVE COCCI     IN PAIRS   Culture     Final   Value: FEW METHICILLIN RESISTANT STAPHYLOCOCCUS AUREUS     Note: RIFAMPIN AND GENTAMICIN SHOULD NOT BE USED AS SINGLE  DRUGS FOR TREATMENT OF STAPH INFECTIONS. CRITICAL RESULT CALLED TO, READ BACK BY AND VERIFIED WITH: LINDSAY MOSS 02/08/13 AT 1035 AM BY [facility]   Report Status 02/08/2013 FINAL   Final   Organism ID, Bacteria METHICILLIN RESISTANT STAPHYLOCOCCUS AUREUS   Final  ANAEROBIC CULTURE     Status: None   Collection Time    02/05/13  7:20 AM      Result Value Range Status   Specimen Description WOUND LEFT KNEE   Final   Special Requests NUMBER 1,PT ON CLEOCYIN   Final   Gram Stain     Final   Value: RARE WBC PRESENT, PREDOMINANTLY PMN     RARE SQUAMOUS EPITHELIAL CELLS PRESENT     RARE GRAM POSITIVE COCCI     IN PAIRS   Culture     Final   Value: NO ANAEROBES ISOLATED; CULTURE IN PROGRESS FOR 5 DAYS   Report Status PENDING   Incomplete  GRAM STAIN     Status: None   Collection Time    02/05/13  7:20 AM      Result Value Range Status   Specimen Description WOUND LEFT KNEE   Final   Special Requests NUMBER 1,PT ON CLEOCYIN   Final   Gram Stain     Final   Value: MODERATE WBC PRESENT,BOTH PMN AND MONONUCLEAR     RARE GRAM  POSITIVE COCCI IN PAIRS IN CLUSTERS     Gram Stain Report Called to,Read Back By and Verified With: S.GREGSON,RN 02/05/13 0820 BY BSLADE   Report Status 02/05/2013 FINAL   Final  WOUND CULTURE     Status: None   Collection Time    02/05/13  7:24 AM      Result Value Range Status   Specimen Description WOUND LEFT KNEE   Final   Special Requests NUMBER 2,PT ON CLEOCYIN    Final   Gram Stain     Final   Value: NO WBC SEEN     NO SQUAMOUS EPITHELIAL CELLS SEEN     NO ORGANISMS SEEN   Culture NO GROWTH 2 DAYS   Final   Report Status 02/07/2013 FINAL   Final  ANAEROBIC CULTURE     Status: None   Collection Time    02/05/13  7:24 AM      Result Value Range Status   Specimen Description WOUND LEFT KNEE   Final   Special Requests NUMBER 2,PT ON CLEOCYIN   Final   Gram Stain     Final   Value: NO WBC SEEN     NO SQUAMOUS EPITHELIAL CELLS SEEN     NO ORGANISMS SEEN   Culture     Final   Value: NO ANAEROBES ISOLATED; CULTURE IN PROGRESS FOR 5 DAYS   Report Status PENDING   Incomplete  GRAM STAIN     Status: None   Collection Time    02/05/13  7:24 AM      Result Value Range Status   Specimen Description WOUND LEFT KNEE   Final   Special Requests NUMBER 2,PT ON CLEOCYIN   Final   Gram Stain     Final   Value: RARE WBC PRESENT,BOTH PMN AND MONONUCLEAR     NO ORGANISMS SEEN     Gram Stain Report Called to,Read Back By and Verified With: S.GREGSON,RN 02/05/13 0820 BY BSLADE   Report Status 02/05/2013 FINAL   Final    Studies/Results: No results found.  Assessment: He has staph aureus prepatellar bursitis.  He's been approved for financial assistance for outpatient linezolid.  Plan: 1. Agree with discharge home on oral linezolid for 3 weeks. 2. I will arrange followup in my clinic  Cliffton Asters, MD [facility] for Infectious Disease [facility] [phone] pager   [phone] cell 02/08/2013, 2:04 PM

## 2013-02-08 NOTE — Progress Notes (Signed)
Discharge instructions reviewed with pt and prescriptions given.  Instructed and demonstrated to pt and pt's fiance how to do dressing changes.  Pt and pt's fiance verbalized understanding and had no questions.  Pt discharged in stable condition with family.  Hector Shade Edgar

## 2013-02-08 NOTE — Progress Notes (Signed)
02-08-13 Patient has declined home health . [facility] aware. Ronny Flurry RN BSN [phone]

## 2013-02-08 NOTE — Progress Notes (Addendum)
[facility]  SURGICAL [id number] [place] [address] Phone: [phone]  February 08, 2013  Patient: Nathaniel [name]  Date of Birth: [birth year]  Date of Visit: 02/05/2013    To Whom It May Concern:  Dru Primeau was seen and treated in our emergency department on 02/05/2013. Andren Bethea  may return to work on 03/11/13.  Sincerely,      Crista Curb, M.D.

## 2013-02-10 LAB — ANAEROBIC CULTURE: Gram Stain: NONE SEEN

## 2013-02-25 ENCOUNTER — Encounter (HOSPITAL_BASED_OUTPATIENT_CLINIC_OR_DEPARTMENT_OTHER): Payer: BC Managed Care – PPO | Attending: Plastic Surgery

## 2013-02-25 DIAGNOSIS — L02419 Cutaneous abscess of limb, unspecified: Secondary | ICD-10-CM | POA: Insufficient documentation

## 2013-02-25 DIAGNOSIS — A4902 Methicillin resistant Staphylococcus aureus infection, unspecified site: Secondary | ICD-10-CM | POA: Insufficient documentation

## 2013-02-25 DIAGNOSIS — I10 Essential (primary) hypertension: Secondary | ICD-10-CM | POA: Insufficient documentation

## 2013-02-26 ENCOUNTER — Encounter: Payer: Self-pay | Admitting: Internal Medicine

## 2013-02-26 ENCOUNTER — Ambulatory Visit (INDEPENDENT_AMBULATORY_CARE_PROVIDER_SITE_OTHER): Payer: BC Managed Care – PPO | Admitting: Internal Medicine

## 2013-02-26 VITALS — BP 102/69 | HR 81 | Temp 98.2°F | Ht 73.0 in | Wt 259.0 lb

## 2013-02-26 DIAGNOSIS — M009 Pyogenic arthritis, unspecified: Secondary | ICD-10-CM

## 2013-02-26 LAB — CBC
MCH: 29 pg (ref 26.0–34.0)
MCHC: 34.1 g/dL (ref 30.0–36.0)
Platelets: 177 10*3/uL (ref 150–400)
RBC: 4.35 MIL/uL (ref 4.22–5.81)
RDW: 12.9 % (ref 11.5–15.5)

## 2013-02-26 MED ORDER — DOXYCYCLINE HYCLATE 100 MG PO TABS: 100.0000 mg | ORAL_TABLET | Freq: Two times a day (BID) | ORAL | Status: DC

## 2013-02-26 NOTE — Progress Notes (Signed)
[facility]  NAME:  Nathaniel [name], Nathaniel [name] NO.:  [id number]  MEDICAL RECORD NO.:  [id number]      DATE OF BIRTH:  07-08-1981  PHYSICIAN:  Ardath Sax, M.D.           VISIT DATE:                                  OFFICE VISIT   HISTORY OF PRESENT ILLNESS:  This is a 32 year old man, who comes to [place] with a history of having had an infrapatellar abscess on his right knee that required arthroscopy and drainage.  He is on Zyvox at the present time 600 mg twice a day for a methicillin-resistant Staph aureus infection.  His blood pressure today is 101/68, his pulse is 70, temperature 98.6.  He weighs 254 pounds and he is 6 feet tall.  This was operated on about a month ago when he has had a draining wound since that time.  He says that he is allergic to vancomycin, so he has been on the oral Zyvox for the MRSA.  Other medicines he is on are Celexa, lisinopril, metoprolol, Prilosec, and oxycodone.  He is allergic to penicillin, latex, Flagyl, and vancomycin.  We examined this wound and he has the arthroscopy scars and he has got about a 1 cm wound under the patella which is being packed with silver alginate.  We will continue packing it and continue the Zyvox and I will see him back here in a week.  DIAGNOSES: 1. Infrapatellar abscess on the right. 2. History of arthroscopy for this problem. 3. Hypertension.     Ardath Sax, M.D.     PP/MEDQ  D:  02/25/2013  T:  02/26/2013  Job:  [id number]

## 2013-02-26 NOTE — Progress Notes (Signed)
Patient ID: Nathaniel [name], male   DOB: Aug 31, 1980, 32 y.o.   MRN: [id number]         [facility] for Infectious Disease  Patient Active Problem List   Diagnosis Date Noted  . COPD exacerbation 02/06/2013  . Septic prepatellar bursitis of left knee 02/05/2013  . HTN (hypertension) 02/05/2013  . Hyperglycemia 02/05/2013  . Tobacco abuse 07/19/2012    Patient's Medications  New Prescriptions   DOXYCYCLINE (VIBRA-TABS) 100 MG TABLET    Take 1 tablet (100 mg total) by mouth 2 (two) times daily.  Previous Medications   CITALOPRAM (CELEXA) 20 MG TABLET    Take 40 mg by mouth daily.   LISINOPRIL (PRINIVIL,ZESTRIL) 10 MG TABLET    Take 10 mg by mouth daily.   METOPROLOL TARTRATE (LOPRESSOR) 25 MG TABLET    Take 50 mg by mouth daily.   OMEPRAZOLE (PRILOSEC) 20 MG CAPSULE    Take 20 mg by mouth daily.   OXYCODONE-ACETAMINOPHEN (ROXICET) 5-325 MG PER TABLET    Take 1-2 tablets by mouth every 4 (four) hours as needed for pain.  Modified Medications   No medications on file  Discontinued Medications   LINEZOLID (ZYVOX) 600 MG TABLET    Take 1 tablet (600 mg total) by mouth every 12 (twelve) hours.    Subjective: Nathaniel [name] is in for his hospital followup visit. He developed acute left knee septic bursitis and underwent incision and drainage last month very operative cultures grew MRSA. Because of a history of unknown allergic reactions to penicillin and vancomycin he was discharged home on oral linezolid. He is now completed 3 weeks of therapy. His left knee is feeling better with minimal pain but he is still having to pack the wound. His wife states that she is unable to get as much packing in recently. He still having some yellow-green discharge. He has not had any fever. Shortly after starting linezolid he developed some sores on his tongue and a diffuse rash but did not call to let me know. Review of Systems: Pertinent items are noted in HPI.  Past Medical History  Diagnosis Date  .  COPD (chronic obstructive pulmonary disease)   . Bell's palsy   . IBS (irritable bowel syndrome)   . Hypertension     History  Substance Use Topics  . Smoking status: Current Every Day Smoker -- 0.50 packs/day for 12 years    Types: Cigarettes  . Smokeless tobacco: Never Used  . Alcohol Use: No    No family history on file.  Allergies  Allergen Reactions  . Flagyl (Metronidazole) Swelling  . Latex Hives  . Penicillins Swelling  . Phenergan (Promethazine Hcl) Other (See Comments)    Seizure  . Vancomycin Swelling    Objective: Temp: 98.2 F (36.8 C) (08/12 1051) Temp src: Oral (08/12 1051) BP: 102/69 mmHg (08/12 1051) Pulse Rate: 81 (08/12 1051)  General: He is alert and in no distress Skin: Fine papular rash on face with erythema. More scattered erythematous papules on trunk and extremities Oral: Smooth shiny tongue without any obvious ulcerations or other oropharyngeal lesions Lungs: Clear Cor: Regular S1 and S2 no murmurs Abdomen: Obese, soft nontender Left knee: Incision over her prepatellar bursa remains open and packed. There is no drainage on the dressing, odor or, erythema or pain with palpation   Assessment: He is improving but may be having an allergic reaction to linezolid. I will switch him to oral doxycycline.  Plan: 1. Change linezolid to doxycycline 100  mg twice daily 2. Check CBC 3. Followup in 2 weeks   Cliffton Asters, MD [facility] for Infectious Disease [facility] [phone] pager   [phone] cell 02/26/2013, 11:14 AM

## 2013-02-26 NOTE — Progress Notes (Deleted)
Patient ID: Kell Ferris, male   DOB: 1981/06/27, 32 y.o.   MRN: [id number]         [facility] for Infectious Disease  Patient Active Problem List   Diagnosis Date Noted  . COPD exacerbation 02/06/2013  . Septic arthritis of knee 02/05/2013  . HTN (hypertension) 02/05/2013  . Hyperglycemia 02/05/2013  . Tobacco abuse 07/19/2012    Patient's Medications  New Prescriptions   DOXYCYCLINE (VIBRA-TABS) 100 MG TABLET    Take 1 tablet (100 mg total) by mouth 2 (two) times daily.  Previous Medications   CITALOPRAM (CELEXA) 20 MG TABLET    Take 40 mg by mouth daily.   LISINOPRIL (PRINIVIL,ZESTRIL) 10 MG TABLET    Take 10 mg by mouth daily.   METOPROLOL TARTRATE (LOPRESSOR) 25 MG TABLET    Take 50 mg by mouth daily.   OMEPRAZOLE (PRILOSEC) 20 MG CAPSULE    Take 20 mg by mouth daily.   OXYCODONE-ACETAMINOPHEN (ROXICET) 5-325 MG PER TABLET    Take 1-2 tablets by mouth every 4 (four) hours as needed for pain.  Modified Medications   No medications on file  Discontinued Medications   LINEZOLID (ZYVOX) 600 MG TABLET    Take 1 tablet (600 mg total) by mouth every 12 (twelve) hours.    Subjective: *** Review of Systems: {Review Of Systems:30496}  Past Medical History  Diagnosis Date  . COPD (chronic obstructive pulmonary disease)   . Bell's palsy   . IBS (irritable bowel syndrome)   . Hypertension     History  Substance Use Topics  . Smoking status: Current Every Day Smoker -- 0.50 packs/day for 12 years    Types: Cigarettes  . Smokeless tobacco: Never Used  . Alcohol Use: No    No family history on file.  Allergies  Allergen Reactions  . Flagyl (Metronidazole) Swelling  . Latex Hives  . Penicillins Swelling  . Phenergan (Promethazine Hcl) Other (See Comments)    Seizure  . Vancomycin Swelling    Objective: Temp: 98.2 F (36.8 C) (08/12 1051) Temp src: Oral (08/12 1051) BP: 102/69 mmHg (08/12 1051) Pulse Rate: 81 (08/12 1051)  General: *** Skin:  *** Lungs: *** Cor: *** Abdomen: *** ***  Lab Results    Assessment: ***  Plan: 1. ***   Cliffton Asters, MD [facility] [phone] pager   [phone] cell 02/26/2013, 11:12 AM

## 2013-03-12 ENCOUNTER — Ambulatory Visit: Payer: BC Managed Care – PPO | Admitting: Internal Medicine

## 2013-03-25 ENCOUNTER — Encounter (HOSPITAL_BASED_OUTPATIENT_CLINIC_OR_DEPARTMENT_OTHER): Payer: BC Managed Care – PPO | Attending: Plastic Surgery

## 2013-05-23 ENCOUNTER — Other Ambulatory Visit: Payer: Self-pay

## 2013-10-06 ENCOUNTER — Emergency Department (HOSPITAL_COMMUNITY): Payer: BC Managed Care – PPO

## 2013-10-06 ENCOUNTER — Emergency Department: Payer: Self-pay | Admitting: Internal Medicine

## 2013-10-06 ENCOUNTER — Encounter (HOSPITAL_COMMUNITY): Payer: Self-pay | Admitting: Emergency Medicine

## 2013-10-06 ENCOUNTER — Other Ambulatory Visit: Payer: Self-pay

## 2013-10-06 ENCOUNTER — Emergency Department (HOSPITAL_COMMUNITY)
Admission: EM | Admit: 2013-10-06 | Discharge: 2013-10-07 | Disposition: A | Discharge status: Home / Self Care | Payer: BC Managed Care – PPO | Attending: Emergency Medicine | Admitting: Emergency Medicine

## 2013-10-06 DIAGNOSIS — J4489 Other specified chronic obstructive pulmonary disease: Secondary | ICD-10-CM | POA: Insufficient documentation

## 2013-10-06 DIAGNOSIS — I10 Essential (primary) hypertension: Secondary | ICD-10-CM | POA: Insufficient documentation

## 2013-10-06 DIAGNOSIS — Z880 Allergy status to penicillin: Secondary | ICD-10-CM | POA: Insufficient documentation

## 2013-10-06 DIAGNOSIS — F17200 Nicotine dependence, unspecified, uncomplicated: Secondary | ICD-10-CM | POA: Insufficient documentation

## 2013-10-06 DIAGNOSIS — K219 Gastro-esophageal reflux disease without esophagitis: Secondary | ICD-10-CM | POA: Insufficient documentation

## 2013-10-06 DIAGNOSIS — F319 Bipolar disorder, unspecified: Secondary | ICD-10-CM | POA: Insufficient documentation

## 2013-10-06 DIAGNOSIS — Z91040 Latex allergy status: Secondary | ICD-10-CM | POA: Insufficient documentation

## 2013-10-06 DIAGNOSIS — J449 Chronic obstructive pulmonary disease, unspecified: Secondary | ICD-10-CM | POA: Insufficient documentation

## 2013-10-06 DIAGNOSIS — Z79899 Other long term (current) drug therapy: Secondary | ICD-10-CM | POA: Insufficient documentation

## 2013-10-06 DIAGNOSIS — Z9089 Acquired absence of other organs: Secondary | ICD-10-CM | POA: Insufficient documentation

## 2013-10-06 DIAGNOSIS — R0789 Other chest pain: Secondary | ICD-10-CM | POA: Insufficient documentation

## 2013-10-06 DIAGNOSIS — F209 Schizophrenia, unspecified: Secondary | ICD-10-CM | POA: Insufficient documentation

## 2013-10-06 DIAGNOSIS — Z8669 Personal history of other diseases of the nervous system and sense organs: Secondary | ICD-10-CM | POA: Insufficient documentation

## 2013-10-06 DIAGNOSIS — Z9889 Other specified postprocedural states: Secondary | ICD-10-CM | POA: Insufficient documentation

## 2013-10-06 HISTORY — DX: Bipolar disorder, unspecified: F31.9

## 2013-10-06 HISTORY — DX: Schizophrenia, unspecified: F20.9

## 2013-10-06 LAB — BASIC METABOLIC PANEL
ANION GAP: 6 — AB (ref 7–16)
BUN: 8 mg/dL (ref 7–18)
BUN: 8 mg/dL (ref 6–23)
CALCIUM: 9 mg/dL (ref 8.4–10.5)
CHLORIDE: 105 mmol/L (ref 98–107)
CO2: 24 meq/L (ref 19–32)
CREATININE: 0.97 mg/dL (ref 0.50–1.35)
Calcium, Total: 8.7 mg/dL (ref 8.5–10.1)
Chloride: 99 mEq/L (ref 96–112)
Co2: 27 mmol/L (ref 21–32)
Creatinine: 1.25 mg/dL (ref 0.60–1.30)
EGFR (African American): >60
EGFR (Non-African Amer.): >60
GFR calc Af Amer: >90 mL/min (ref >90)
GFR calc non Af Amer: >90 mL/min (ref >90)
GLUCOSE: 123 mg/dL — AB (ref 70–99)
Glucose: 112 mg/dL — ABNORMAL HIGH (ref 65–99)
OSMOLALITY: 275 (ref 275–301)
POTASSIUM: 3.6 mmol/L (ref 3.5–5.1)
Potassium: 3.7 mEq/L (ref 3.7–5.3)
SODIUM: 138 mmol/L (ref 136–145)
Sodium: 139 mEq/L (ref 137–147)

## 2013-10-06 LAB — CBC
HCT: 47 % (ref 40.0–52.0)
HCT: 46.1 % (ref 39.0–52.0)
HEMOGLOBIN: 16.6 g/dL (ref 13.0–17.0)
HGB: 16.2 g/dL (ref 13.0–18.0)
MCH: 30.2 pg (ref 26.0–34.0)
MCH: 30.9 pg (ref 26.0–34.0)
MCHC: 34.4 g/dL (ref 32.0–36.0)
MCHC: 36 g/dL (ref 30.0–36.0)
MCV: 88 fL (ref 80–100)
MCV: 85.8 fL (ref 78.0–100.0)
Platelet: 182 10*3/uL (ref 150–440)
Platelets: 207 10*3/uL (ref 150–400)
RBC: 5.36 10*6/uL (ref 4.40–5.90)
RBC: 5.37 MIL/uL (ref 4.22–5.81)
RDW: 12.9 % (ref 11.5–14.5)
RDW: 12.4 % (ref 11.5–15.5)
WBC: 4.6 10*3/uL (ref 3.8–10.6)
WBC: 5.2 10*3/uL (ref 4.0–10.5)

## 2013-10-06 LAB — TROPONIN I

## 2013-10-06 LAB — D-DIMER(ARMC): D-DIMER: 108 ng/mL

## 2013-10-06 LAB — PRO B NATRIURETIC PEPTIDE: Pro B Natriuretic peptide (BNP): 5.3 pg/mL (ref 0–125)

## 2013-10-06 LAB — I-STAT TROPONIN, ED: Troponin i, poc: 0 ng/mL (ref 0.00–0.08)

## 2013-10-06 MED ORDER — ESOMEPRAZOLE MAGNESIUM 40 MG PO CPDR: 40.0000 mg | DELAYED_RELEASE_CAPSULE | Freq: Every day | ORAL | Status: DC

## 2013-10-06 MED ORDER — FAMOTIDINE 20 MG PO TABS
40.0000 mg | ORAL_TABLET | Freq: Once | ORAL | Status: AC
  Administered 2013-10-06: 40 mg via ORAL
  Filled 2013-10-06: qty 2

## 2013-10-06 MED ORDER — GI COCKTAIL ~~LOC~~
30.0000 mL | Freq: Once | ORAL | Status: AC
  Administered 2013-10-06: 30 mL via ORAL
  Filled 2013-10-06: qty 30

## 2013-10-06 MED ORDER — FAMOTIDINE 20 MG PO TABS: 20.0000 mg | ORAL_TABLET | Freq: Two times a day (BID) | ORAL | Status: DC

## 2013-10-06 NOTE — ED Notes (Signed)
Pt states chest still hurting; RN informed pt he is next to go back and we will get him seen shortly.

## 2013-10-06 NOTE — ED Notes (Signed)
Reported that:  pt left [place] this afternoon prior to seeing provider. Was given ASA 324mg  at 1300.

## 2013-10-06 NOTE — Discharge Instructions (Signed)
Please read and follow all provided instructions.  Your diagnoses today include:  1. Non-cardiac chest pain   2. GERD (gastroesophageal reflux disease)     Tests performed today include:  An EKG of your heart - no signs of heart attack  A chest x-ray - normal  Cardiac enzymes - a blood test for heart muscle damage, no sign of heart attack  Blood counts and electrolytes  Vital signs. See below for your results today.   Medications prescribed:   Nexium - stomach acid reducer  This medication can be found over-the-counter   Pepcid (famotidine) - antihistamine  You can find this medication over-the-counter.   DO NOT exceed:   20mg  Pepcid every 12 hours  Take any prescribed medications only as directed.  Follow-up instructions: Please follow-up with your primary care provider as soon as you can for further evaluation of your symptoms. If you do not have a primary care doctor -- see below for referral information.   Return instructions:  SEEK IMMEDIATE MEDICAL ATTENTION IF:  You have severe chest pain, especially if the pain is crushing or pressure-like and spreads to the arms, back, neck, or jaw, or if you have sweating, nausea (feeling sick to your stomach), or shortness of breath. THIS IS AN EMERGENCY. Don't wait to see if the pain will go away. Get medical help at once. Call 911 or 0 (operator). DO NOT drive yourself to the hospital.   Your chest pain gets worse and does not go away with rest.   You have an attack of chest pain lasting longer than usual, despite rest and treatment with the medications your caregiver has prescribed.   You wake from sleep with chest pain or shortness of breath.  You feel dizzy or faint.  You have chest pain not typical of your usual pain for which you originally saw your caregiver.   You have any other emergent concerns regarding your health.  Additional Information: Chest pain comes from many different causes. Your caregiver has  diagnosed you as having chest pain that is not specific for one problem, but does not require admission.  You are at low risk for an acute heart condition or other serious illness.   Your vital signs today were: BP 117/71   Pulse 79   Temp(Src) 97.5 F (36.4 C) (Oral)   Resp 18   Ht 6\' 1"  (1.854 m)   Wt 250 lb (113.399 kg)   BMI 32.99 kg/m2   SpO2 97% If your blood pressure (BP) was elevated above 135/85 this visit, please have this repeated by your doctor within one month. --------------

## 2013-10-06 NOTE — ED Notes (Signed)
Reports feeling like "a ton of bricks sitting on left chest", L arm tingling, and sob since last night.  Reports feeling of indigestion over the last 8 days that he has used acid reflux meds without relief.  Took 4 baby ASA at 1pm today.  Denies nausea and vomiting.

## 2013-10-06 NOTE — ED Provider Notes (Signed)
CSN: [id number]     Arrival date & time 10/06/13  1955 History   First MD Initiated Contact with Patient 10/06/13 2236     Chief Complaint  Patient presents with  . Chest Pain     (Consider location/radiation/quality/duration/timing/severity/associated sxs/prior Treatment) HPI Comments: Patient with history of COPD, GERD -- presents with complaint of chest pain and shortness of breath for the past 8 days. Patient states that symptoms have been constantly there it is slightly worsened. Patient initially said that pain felt like his typical GERD symptoms. These have become more severe. Patient states that he has been short of breath with exertion only. On my history, he denies radiation of pain. He denies nausea, vomiting, lightheadedness, palpitations, diaphoresis. He has been taking over-the-counter acid reflux medications without relief. Patient did take aspirin today. Patient has a family history of coronary artery disease in his father. He has hypertension, no hypercholesterolemia or diabetes. Patient denies risk factors for pulmonary embolism including: unilateral leg swelling, history of DVT/PE/other blood clots, use of estrogens, recent immobilizations, recent surgery, recent travel (>4hr segment), malignancy, hemoptysis. The onset of this condition was acute. The course is constant. Aggravating factors: none. Alleviating factors: none.    The history is provided by the patient.    Past Medical History  Diagnosis Date  . COPD (chronic obstructive pulmonary disease)   . Bell's palsy   . IBS (irritable bowel syndrome)   . Hypertension   . Bipolar 1 disorder   . Schizophrenia    Past Surgical History  Procedure Laterality Date  . Cholecystectomy    . Abdominal surgery    . Incision and drainage Left 02/05/2013    Dr Rolena Infante  . Knee arthroscopy with medial menisectomy Left 02/05/2013    Procedure: KNEE ARTHROSCOPY  ;  Surgeon: Melina Schools, MD;  Location: [place];  Service: Orthopedics;   Laterality: Left;  . Irrigation and debridement knee  02/05/2013    Procedure: IRRIGATION AND DEBRIDEMENT KNEE patella bursa;  Surgeon: Melina Schools, MD;  Location: [place];  Service: Orthopedics;;   No family history on file. History  Substance Use Topics  . Smoking status: Current Every Day Smoker -- 0.50 packs/day for 12 years    Types: Cigarettes  . Smokeless tobacco: Never Used  . Alcohol Use: Yes    Review of Systems  Constitutional: Negative for fever and diaphoresis.  Eyes: Negative for redness.  Respiratory: Negative for cough and shortness of breath.   Cardiovascular: Positive for chest pain. Negative for palpitations and leg swelling.  Gastrointestinal: Negative for nausea, vomiting and abdominal pain.  Genitourinary: Negative for dysuria.  Musculoskeletal: Negative for back pain and neck pain.  Skin: Negative for rash.  Neurological: Negative for syncope and light-headedness.      Allergies  Flagyl; Latex; Penicillins; Phenergan; and Vancomycin  Home Medications   Current Outpatient Rx  Name  Route  Sig  Dispense  Refill  . carbamazepine (TEGRETOL XR) 200 MG 12 hr tablet   Oral   Take 600 mg by mouth daily.         Marland Kitchen lurasidone (LATUDA) 40 MG TABS tablet   Oral   Take 40 mg by mouth daily with breakfast.          BP 129/82  Pulse 75  Temp(Src) 97.6 F (36.4 C) (Oral)  Resp 20  Ht 6\' 1"  (1.854 m)  Wt 250 lb (113.399 kg)  BMI 32.99 kg/m2  SpO2 97% Physical Exam  Nursing note and vitals  reviewed. Constitutional: He appears well-developed and well-nourished.  HENT:  Head: Normocephalic and atraumatic.  Mouth/Throat: Mucous membranes are normal. Mucous membranes are not dry.  Eyes: Conjunctivae are normal.  Neck: Trachea normal and normal range of motion. Neck supple. Normal carotid pulses and no JVD present. No muscular tenderness present. Carotid bruit is not present. No tracheal deviation present.  Cardiovascular: Normal rate, regular rhythm,  S1 normal, S2 normal, normal heart sounds and intact distal pulses.  Exam reveals no distant heart sounds and no decreased pulses.   No murmur heard. Pulmonary/Chest: Effort normal and breath sounds normal. No respiratory distress. He has no wheezes. He exhibits no tenderness.  Abdominal: Soft. Normal aorta and bowel sounds are normal. There is no tenderness. There is no rebound and no guarding.  Musculoskeletal: He exhibits no edema.  Neurological: He is alert.  Skin: Skin is warm and dry. He is not diaphoretic. No cyanosis. No pallor.  Psychiatric: He has a normal mood and affect.    ED Course  Procedures (including critical care time) Labs Review Labs Reviewed  BASIC METABOLIC PANEL - Abnormal; Notable for the following:    Glucose, Bld 123 (*)    All other components within normal limits  CBC  PRO B NATRIURETIC PEPTIDE  I-STAT TROPOININ, ED   Imaging Review Dg Chest 2 View  10/06/2013   CLINICAL DATA:  Left-sided chest pain.  Shortness of breath.  COPD.  EXAM: CHEST  2 VIEW  COMPARISON:  06/21/2013  FINDINGS: The heart size and mediastinal contours are within normal limits. Both lungs are clear. The visualized skeletal structures are unremarkable.  IMPRESSION: No active cardiopulmonary disease.   Electronically Signed   By: Earle Gell M.D.   On: 10/06/2013 21:30     EKG Interpretation None      11:19 PM Patient seen and examined. Work-up reviewed. Medications ordered. EKG reviewed. Patient is anxious to go home.   Vital signs reviewed and are as follows: Filed Vitals:   10/06/13 2155  BP: 129/82  Pulse: 75  Temp: 97.6 F (36.4 C)  Resp: 20   11:55 PM Patient states that he feels better after GI cocktail. Will d/c with Nexium and Pepcid. Encouraged PCP f/u.   Patient was counseled to return with severe chest pain, especially if the pain is crushing or pressure-like and spreads to the arms, back, neck, or jaw, or if they have sweating, nausea, or shortness of breath  with the pain. They were encouraged to call 911 with these symptoms.   They were also told to return if their chest pain gets worse and does not go away with rest, they have an attack of chest pain lasting longer than usual despite rest and treatment with the medications their caregiver has prescribed, if they wake from sleep with chest pain or shortness of breath, if they feel dizzy or faint, if they have chest pain not typical of their usual pain, or if they have any other emergent concerns regarding their health.  The patient verbalized understanding and agreed.   MDM   Final diagnoses:  Non-cardiac chest pain  GERD (gastroesophageal reflux disease)   Patient with chest tightness/reflux symptoms. Symptoms improved with GI cocktail. Feel patient is low risk for ACS given history (poor story for ACS/MI -- symptoms nearly constant for 8 days), negative troponin, normal/unchanged EKG. Remainder of work-up unconcerning. Patient appears well, in no distress.      Carlisle Cater, PA-C 10/06/13 2357

## 2013-10-07 NOTE — ED Provider Notes (Signed)
Medical screening examination/treatment/procedure(s) were performed by non-physician practitioner and as supervising physician I was immediately available for consultation/collaboration.   EKG Interpretation None       Teressa Lower, MD 10/07/13 [phone]

## 2013-10-29 ENCOUNTER — Emergency Department: Payer: Self-pay | Admitting: Emergency Medicine

## 2013-10-29 LAB — COMPREHENSIVE METABOLIC PANEL
ALT: 52 U/L (ref 12–78)
AST: 27 U/L (ref 15–37)
Albumin: 3.2 g/dL — ABNORMAL LOW (ref 3.4–5.0)
Alkaline Phosphatase: 114 U/L
Anion Gap: 5 — ABNORMAL LOW (ref 7–16)
BUN: 12 mg/dL (ref 7–18)
Bilirubin,Total: 0.2 mg/dL (ref 0.2–1.0)
CO2: 26 mmol/L (ref 21–32)
Calcium, Total: 8.4 mg/dL — ABNORMAL LOW (ref 8.5–10.1)
Chloride: 108 mmol/L — ABNORMAL HIGH (ref 98–107)
Creatinine: 1.14 mg/dL (ref 0.60–1.30)
EGFR (African American): >60
GLUCOSE: 109 mg/dL — AB (ref 65–99)
OSMOLALITY: 278 (ref 275–301)
Potassium: 4 mmol/L (ref 3.5–5.1)
Sodium: 139 mmol/L (ref 136–145)
TOTAL PROTEIN: 6.5 g/dL (ref 6.4–8.2)

## 2013-10-29 LAB — CBC
HCT: 46.1 % (ref 40.0–52.0)
HGB: 15.2 g/dL (ref 13.0–18.0)
MCH: 28.9 pg (ref 26.0–34.0)
MCHC: 32.9 g/dL (ref 32.0–36.0)
MCV: 88 fL (ref 80–100)
Platelet: 184 10*3/uL (ref 150–440)
RBC: 5.24 10*6/uL (ref 4.40–5.90)
RDW: 12.5 % (ref 11.5–14.5)
WBC: 5.1 10*3/uL (ref 3.8–10.6)

## 2013-10-29 LAB — URINALYSIS, COMPLETE
BLOOD: NEGATIVE
Bacteria: NONE SEEN
Bilirubin,UR: NEGATIVE
Glucose,UR: NEGATIVE mg/dL (ref 0–75)
KETONE: NEGATIVE
Leukocyte Esterase: NEGATIVE
Nitrite: NEGATIVE
PH: 6 (ref 4.5–8.0)
PROTEIN: NEGATIVE
SPECIFIC GRAVITY: 1.018 (ref 1.003–1.030)
Squamous Epithelial: <1
WBC UR: 1 /HPF (ref 0–5)

## 2013-10-29 LAB — LIPASE, BLOOD: Lipase: 121 U/L (ref 73–393)

## 2013-10-31 ENCOUNTER — Emergency Department: Payer: Self-pay | Admitting: Emergency Medicine

## 2013-10-31 LAB — COMPREHENSIVE METABOLIC PANEL
Albumin: 3.5 g/dL (ref 3.4–5.0)
Alkaline Phosphatase: 96 U/L
Anion Gap: 8 (ref 7–16)
BUN: 8 mg/dL (ref 7–18)
Bilirubin,Total: 0.3 mg/dL (ref 0.2–1.0)
Calcium, Total: 8.9 mg/dL (ref 8.5–10.1)
Chloride: 105 mmol/L (ref 98–107)
Co2: 27 mmol/L (ref 21–32)
Creatinine: 1.08 mg/dL (ref 0.60–1.30)
EGFR (African American): >60
GLUCOSE: 132 mg/dL — AB (ref 65–99)
OSMOLALITY: 280 (ref 275–301)
Potassium: 3.3 mmol/L — ABNORMAL LOW (ref 3.5–5.1)
SGOT(AST): 30 U/L (ref 15–37)
SGPT (ALT): 51 U/L (ref 12–78)
SODIUM: 140 mmol/L (ref 136–145)
TOTAL PROTEIN: 6.7 g/dL (ref 6.4–8.2)

## 2013-10-31 LAB — URINALYSIS, COMPLETE
BLOOD: NEGATIVE
Bacteria: NONE SEEN
Bilirubin,UR: NEGATIVE
Glucose,UR: NEGATIVE mg/dL (ref 0–75)
KETONE: NEGATIVE
Leukocyte Esterase: NEGATIVE
Nitrite: NEGATIVE
PH: 6 (ref 4.5–8.0)
PROTEIN: NEGATIVE
RBC, UR: NONE SEEN /HPF (ref 0–5)
SPECIFIC GRAVITY: 1.013 (ref 1.003–1.030)
Squamous Epithelial: NONE SEEN
WBC UR: NONE SEEN /HPF (ref 0–5)

## 2013-10-31 LAB — CBC WITH DIFFERENTIAL/PLATELET
BASOS ABS: 0.1 10*3/uL (ref 0.0–0.1)
Basophil %: 0.9 %
EOS ABS: 0.3 10*3/uL (ref 0.0–0.7)
EOS PCT: 4.3 %
HCT: 47.9 % (ref 40.0–52.0)
HGB: 16.4 g/dL (ref 13.0–18.0)
LYMPHS ABS: 2.1 10*3/uL (ref 1.0–3.6)
LYMPHS PCT: 31.8 %
MCH: 29.6 pg (ref 26.0–34.0)
MCHC: 34.2 g/dL (ref 32.0–36.0)
MCV: 87 fL (ref 80–100)
MONO ABS: 0.5 x10 3/mm (ref 0.2–1.0)
Monocyte %: 7.3 %
NEUTROS ABS: 3.7 10*3/uL (ref 1.4–6.5)
Neutrophil %: 55.7 %
Platelet: 216 10*3/uL (ref 150–440)
RBC: 5.54 10*6/uL (ref 4.40–5.90)
RDW: 12.2 % (ref 11.5–14.5)
WBC: 6.6 10*3/uL (ref 3.8–10.6)

## 2013-10-31 LAB — LIPASE, BLOOD: Lipase: 123 U/L (ref 73–393)

## 2013-11-11 ENCOUNTER — Encounter (HOSPITAL_COMMUNITY): Payer: Self-pay | Admitting: Emergency Medicine

## 2013-11-11 ENCOUNTER — Emergency Department (HOSPITAL_COMMUNITY)
Admission: EM | Admit: 2013-11-11 | Discharge: 2013-11-11 | Disposition: A | Discharge status: Home / Self Care | Payer: BC Managed Care – PPO | Attending: Emergency Medicine | Admitting: Emergency Medicine

## 2013-11-11 ENCOUNTER — Emergency Department (HOSPITAL_COMMUNITY): Payer: BC Managed Care – PPO

## 2013-11-11 DIAGNOSIS — R109 Unspecified abdominal pain: Secondary | ICD-10-CM

## 2013-11-11 DIAGNOSIS — F17200 Nicotine dependence, unspecified, uncomplicated: Secondary | ICD-10-CM | POA: Insufficient documentation

## 2013-11-11 DIAGNOSIS — Z8669 Personal history of other diseases of the nervous system and sense organs: Secondary | ICD-10-CM | POA: Insufficient documentation

## 2013-11-11 DIAGNOSIS — F209 Schizophrenia, unspecified: Secondary | ICD-10-CM | POA: Insufficient documentation

## 2013-11-11 DIAGNOSIS — I10 Essential (primary) hypertension: Secondary | ICD-10-CM | POA: Insufficient documentation

## 2013-11-11 DIAGNOSIS — J449 Chronic obstructive pulmonary disease, unspecified: Secondary | ICD-10-CM | POA: Insufficient documentation

## 2013-11-11 DIAGNOSIS — Z79899 Other long term (current) drug therapy: Secondary | ICD-10-CM | POA: Insufficient documentation

## 2013-11-11 DIAGNOSIS — Z91040 Latex allergy status: Secondary | ICD-10-CM | POA: Insufficient documentation

## 2013-11-11 DIAGNOSIS — F319 Bipolar disorder, unspecified: Secondary | ICD-10-CM | POA: Insufficient documentation

## 2013-11-11 DIAGNOSIS — Z9889 Other specified postprocedural states: Secondary | ICD-10-CM | POA: Insufficient documentation

## 2013-11-11 DIAGNOSIS — J4489 Other specified chronic obstructive pulmonary disease: Secondary | ICD-10-CM | POA: Insufficient documentation

## 2013-11-11 DIAGNOSIS — Z880 Allergy status to penicillin: Secondary | ICD-10-CM | POA: Insufficient documentation

## 2013-11-11 DIAGNOSIS — Z8719 Personal history of other diseases of the digestive system: Secondary | ICD-10-CM | POA: Insufficient documentation

## 2013-11-11 DIAGNOSIS — Z9089 Acquired absence of other organs: Secondary | ICD-10-CM | POA: Insufficient documentation

## 2013-11-11 LAB — CBC
HEMATOCRIT: 45.8 % (ref 39.0–52.0)
Hemoglobin: 16.4 g/dL (ref 13.0–17.0)
MCH: 30 pg (ref 26.0–34.0)
MCHC: 35.8 g/dL (ref 30.0–36.0)
MCV: 83.9 fL (ref 78.0–100.0)
Platelets: 212 10*3/uL (ref 150–400)
RBC: 5.46 MIL/uL (ref 4.22–5.81)
RDW: 12 % (ref 11.5–15.5)
WBC: 4.8 10*3/uL (ref 4.0–10.5)

## 2013-11-11 LAB — BASIC METABOLIC PANEL
BUN: 8 mg/dL (ref 6–23)
CHLORIDE: 103 meq/L (ref 96–112)
CO2: 24 meq/L (ref 19–32)
Calcium: 9.2 mg/dL (ref 8.4–10.5)
Creatinine, Ser: 0.9 mg/dL (ref 0.50–1.35)
GFR calc Af Amer: >90 mL/min (ref >90)
GFR calc non Af Amer: >90 mL/min (ref >90)
Glucose, Bld: 102 mg/dL — ABNORMAL HIGH (ref 70–99)
Potassium: 4.3 mEq/L (ref 3.7–5.3)
Sodium: 139 mEq/L (ref 137–147)

## 2013-11-11 LAB — URINALYSIS, ROUTINE W REFLEX MICROSCOPIC
BILIRUBIN URINE: NEGATIVE
GLUCOSE, UA: NEGATIVE mg/dL
Hgb urine dipstick: NEGATIVE
Ketones, ur: NEGATIVE mg/dL
LEUKOCYTES UA: NEGATIVE
Nitrite: NEGATIVE
PH: 6.5 (ref 5.0–8.0)
Protein, ur: NEGATIVE mg/dL
Specific Gravity, Urine: 1.006 (ref 1.005–1.030)
Urobilinogen, UA: 0.2 mg/dL (ref 0.0–1.0)

## 2013-11-11 MED ORDER — HYDROCODONE-ACETAMINOPHEN 5-325 MG PO TABS: 1.0000 | ORAL_TABLET | ORAL | Status: DC | PRN

## 2013-11-11 MED ORDER — IBUPROFEN 200 MG PO TABS
600.0000 mg | ORAL_TABLET | Freq: Once | ORAL | Status: AC
  Administered 2013-11-11: 600 mg via ORAL
  Filled 2013-11-11: qty 3

## 2013-11-11 MED ORDER — HYDROCODONE-ACETAMINOPHEN 5-325 MG PO TABS
2.0000 | ORAL_TABLET | Freq: Once | ORAL | Status: AC
  Administered 2013-11-11: 2 via ORAL
  Filled 2013-11-11: qty 2

## 2013-11-11 MED ORDER — IBUPROFEN 600 MG PO TABS: 600.0000 mg | ORAL_TABLET | Freq: Four times a day (QID) | ORAL | Status: DC | PRN

## 2013-11-11 NOTE — ED Notes (Signed)
Bed: WA19 Expected date:  Expected time:  Means of arrival:  Comments: Hold for triage  

## 2013-11-11 NOTE — Progress Notes (Signed)
[facility] provided pt with a list of primary care resources and a [facility] [employer] application to help patient establish primary care.  °

## 2013-11-11 NOTE — ED Notes (Signed)
Per pt, states left flnak pain for a couple of days-no dysuria

## 2013-11-11 NOTE — ED Provider Notes (Signed)
CSN: [id number]     Arrival date & time 11/11/13  1205 History   First MD Initiated Contact with Patient 11/11/13 1239     Chief Complaint  Patient presents with  . Flank Pain     (Consider location/radiation/quality/duration/timing/severity/associated sxs/prior Treatment) HPI 33 year old male presents with left flank pain for 2-3 days. He states it seems to be constant but does worsen intermittently. He's never had pain like this before. He motions with his arm that goes from his flank down to his groin. Denies any dysuria, hematuria, or discharge. No testicle pain. No bulges or hernia noted by him. He's taken Tylenol for the pain without relief. He's had a prior history of kidney stones and states this does not feel similar. Denies any fevers, chills, cough, shortness of breath, or other symptoms. He states nothing makes the pain better. He states everything he does, breathing, any movement, range of motion or anything else makes it worse. He rates the pain as a 9/10. Denies nausea or vomiting.  Past Medical History  Diagnosis Date  . COPD (chronic obstructive pulmonary disease)   . Bell's palsy   . IBS (irritable bowel syndrome)   . Hypertension   . Bipolar 1 disorder   . Schizophrenia    Past Surgical History  Procedure Laterality Date  . Cholecystectomy    . Abdominal surgery    . Incision and drainage Left 02/05/2013    Dr Rolena Infante  . Knee arthroscopy with medial menisectomy Left 02/05/2013    Procedure: KNEE ARTHROSCOPY  ;  Surgeon: Melina Schools, MD;  Location: [facility];  Service: Orthopedics;  Laterality: Left;  . Irrigation and debridement knee  02/05/2013    Procedure: IRRIGATION AND DEBRIDEMENT KNEE patella bursa;  Surgeon: Melina Schools, MD;  Location: [facility];  Service: Orthopedics;;   No family history on file. History  Substance Use Topics  . Smoking status: Current Every Day Smoker -- 0.50 packs/day for 12 years    Types: Cigarettes  . Smokeless tobacco: Never Used  .  Alcohol Use: Yes    Review of Systems  Constitutional: Negative for fever.  Respiratory: Negative for shortness of breath.   Cardiovascular: Negative for chest pain.  Gastrointestinal: Positive for abdominal pain. Negative for nausea, vomiting and diarrhea.  Genitourinary: Positive for flank pain. Negative for dysuria, hematuria, discharge, scrotal swelling, penile pain and testicular pain.  All other systems reviewed and are negative.     Allergies  Flagyl; Latex; Penicillins; Phenergan; and Vancomycin  Home Medications   Prior to Admission medications   Medication Sig Start Date End Date Taking? Authorizing Provider  carbamazepine (TEGRETOL XR) 200 MG 12 hr tablet Take 600 mg by mouth at bedtime.    Yes Historical Provider, MD  esomeprazole (NEXIUM) 40 MG capsule Take 1 capsule (40 mg total) by mouth daily. 10/06/13  Yes Carlisle Cater, PA-C  famotidine (PEPCID) 20 MG tablet Take 1 tablet (20 mg total) by mouth 2 (two) times daily. 10/06/13  Yes Carlisle Cater, PA-C  lurasidone (LATUDA) 40 MG TABS tablet Take 40 mg by mouth at bedtime.    Yes Historical Provider, MD   BP 136/85  Pulse 98  Temp(Src) 97.9 F (36.6 C) (Oral)  Resp 18  SpO2 100% Physical Exam  Nursing note and vitals reviewed. Constitutional: He is oriented to person, place, and time. He appears well-developed and well-nourished. No distress.  Sitting comfortably on stretcher  HENT:  Head: Normocephalic and atraumatic.  Right Ear: External ear normal.  Left  Ear: External ear normal.  Nose: Nose normal.  Eyes: Right eye exhibits no discharge. Left eye exhibits no discharge.  Neck: Neck supple.  Cardiovascular: Normal rate, regular rhythm, normal heart sounds and intact distal pulses.   Pulmonary/Chest: Effort normal and breath sounds normal.  Abdominal: Soft. There is tenderness (LLQ, left groin). There is CVA tenderness (left sided). No hernia. Hernia confirmed negative in the left inguinal area.   Genitourinary: Penis normal. Right testis shows no mass, no swelling and no tenderness. Left testis shows no mass, no swelling and no tenderness.  Musculoskeletal: He exhibits no edema.  Neurological: He is alert and oriented to person, place, and time.  Skin: Skin is warm and dry.    ED Course  Procedures (including critical care time) Labs Review Labs Reviewed  BASIC METABOLIC PANEL - Abnormal; Notable for the following:    Glucose, Bld 102 (*)    All other components within normal limits  URINALYSIS, ROUTINE W REFLEX MICROSCOPIC  CBC    Imaging Review Ct Abdomen Pelvis Wo Contrast  11/11/2013   CLINICAL DATA:  Left flank pain radiating into left lower quadrant and left testicle. History of previous dens.  EXAM: CT ABDOMEN AND PELVIS WITHOUT CONTRAST  TECHNIQUE: Multidetector CT imaging of the abdomen and pelvis was performed following the standard protocol without intravenous contrast.  COMPARISON:  Prior CT from 10/29/2013.  FINDINGS: The visualized lung bases are clear.  Limited noncontrast evaluation of the liver is unremarkable. The gallbladder is surgically absent. The spleen, adrenal glands, and pancreas demonstrate a normal unenhanced appearance.  The right kidney is within normal limits without evidence of nephrolithiasis or hydronephrosis. No stones seen along the course of the right renal collecting system.  On the left, scattered nonobstructive calculi measure up to 4 mm are present within the interpolar left kidney. No obstructive stone seen along the course of the left renal collecting system there is no left-sided hydronephrosis or hydroureter.  No evidence of bowel obstruction. Stomach is normal. No abnormal wall thickening or inflammatory fat stranding seen about the bowels. Appendix well visualized in the right lower quadrant and is of normal caliber and appearance without associated inflammatory changes to suggest acute appendicitis.  Bladder and prostate are within normal  limits.  No free air or fluid. No enlarged intra-abdominal pelvic lymph nodes. Delete data  No acute osseous abnormality. No worrisome lytic or blastic osseous lesions.  IMPRESSION: 1. Nonobstructive left renal nephrolithiasis measuring up to 4 mm. No CT evidence of obstructive uropathy. 2. No other acute intra-abdominal or pelvic process identified.   Electronically Signed   By: Jeannine Boga M.D.   On: 11/11/2013 13:49     EKG Interpretation None      MDM   Final diagnoses:  None    Patient's workup is benign. No rash to suggest zoster. No hematuria, UTI or GU abnormalities, including no hernia. Given his tenderness, will treat as MSK and give return precautions. CT shows no other cause of his pain.     Ephraim Hamburger, MD 11/11/13 [phone]

## 2013-11-11 NOTE — Discharge Instructions (Signed)

## 2013-11-14 ENCOUNTER — Emergency Department: Payer: Self-pay | Admitting: Emergency Medicine

## 2013-11-14 LAB — CBC WITH DIFFERENTIAL/PLATELET
Basophil #: 0.1 10*3/uL (ref 0.0–0.1)
Basophil %: 1.5 %
EOS ABS: 0.4 10*3/uL (ref 0.0–0.7)
Eosinophil %: 4.2 %
HCT: 48.8 % (ref 40.0–52.0)
HGB: 16.9 g/dL (ref 13.0–18.0)
LYMPHS ABS: 2.7 10*3/uL (ref 1.0–3.6)
Lymphocyte %: 30.4 %
MCH: 30 pg (ref 26.0–34.0)
MCHC: 34.6 g/dL (ref 32.0–36.0)
MCV: 87 fL (ref 80–100)
Monocyte #: 0.6 x10 3/mm (ref 0.2–1.0)
Monocyte %: 7 %
Neutrophil #: 5.1 10*3/uL (ref 1.4–6.5)
Neutrophil %: 56.9 %
Platelet: 241 10*3/uL (ref 150–440)
RBC: 5.63 10*6/uL (ref 4.40–5.90)
RDW: 12.6 % (ref 11.5–14.5)
WBC: 9 10*3/uL (ref 3.8–10.6)

## 2013-11-14 LAB — LIPASE, BLOOD: LIPASE: 142 U/L (ref 73–393)

## 2013-11-14 LAB — URINALYSIS, COMPLETE
Bacteria: NONE SEEN
Bilirubin,UR: NEGATIVE
Blood: NEGATIVE
GLUCOSE, UR: NEGATIVE mg/dL (ref 0–75)
KETONE: NEGATIVE
LEUKOCYTE ESTERASE: NEGATIVE
Nitrite: NEGATIVE
Ph: 7 (ref 4.5–8.0)
Protein: NEGATIVE
RBC,UR: NONE SEEN /HPF (ref 0–5)
SPECIFIC GRAVITY: 1.005 (ref 1.003–1.030)
Squamous Epithelial: NONE SEEN
WBC UR: 3 /HPF (ref 0–5)

## 2013-11-15 LAB — COMPREHENSIVE METABOLIC PANEL
ALBUMIN: 3.6 g/dL (ref 3.4–5.0)
ALK PHOS: 110 U/L
ALT: 50 U/L (ref 12–78)
AST: 29 U/L (ref 15–37)
Anion Gap: 8 (ref 7–16)
BUN: 8 mg/dL (ref 7–18)
Bilirubin,Total: 0.4 mg/dL (ref 0.2–1.0)
CREATININE: 1.09 mg/dL (ref 0.60–1.30)
Calcium, Total: 9.5 mg/dL (ref 8.5–10.1)
Chloride: 102 mmol/L (ref 98–107)
Co2: 29 mmol/L (ref 21–32)
EGFR (Non-African Amer.): >60
GLUCOSE: 88 mg/dL (ref 65–99)
OSMOLALITY: 275 (ref 275–301)
Potassium: 3.9 mmol/L (ref 3.5–5.1)
Sodium: 139 mmol/L (ref 136–145)
TOTAL PROTEIN: 7.1 g/dL (ref 6.4–8.2)

## 2013-12-09 ENCOUNTER — Emergency Department (HOSPITAL_COMMUNITY)
Admission: EM | Admit: 2013-12-09 | Discharge: 2013-12-09 | Disposition: A | Discharge status: Home / Self Care | Payer: Self-pay | Attending: Emergency Medicine | Admitting: Emergency Medicine

## 2013-12-09 ENCOUNTER — Emergency Department (HOSPITAL_COMMUNITY): Payer: BC Managed Care – PPO

## 2013-12-09 ENCOUNTER — Emergency Department (HOSPITAL_COMMUNITY): Payer: Self-pay

## 2013-12-09 ENCOUNTER — Encounter (HOSPITAL_COMMUNITY): Payer: Self-pay | Admitting: Emergency Medicine

## 2013-12-09 DIAGNOSIS — Z79899 Other long term (current) drug therapy: Secondary | ICD-10-CM | POA: Insufficient documentation

## 2013-12-09 DIAGNOSIS — F209 Schizophrenia, unspecified: Secondary | ICD-10-CM | POA: Insufficient documentation

## 2013-12-09 DIAGNOSIS — R109 Unspecified abdominal pain: Secondary | ICD-10-CM | POA: Insufficient documentation

## 2013-12-09 DIAGNOSIS — Z9889 Other specified postprocedural states: Secondary | ICD-10-CM | POA: Insufficient documentation

## 2013-12-09 DIAGNOSIS — Z9089 Acquired absence of other organs: Secondary | ICD-10-CM | POA: Insufficient documentation

## 2013-12-09 DIAGNOSIS — G510 Bell's palsy: Secondary | ICD-10-CM | POA: Insufficient documentation

## 2013-12-09 DIAGNOSIS — F17200 Nicotine dependence, unspecified, uncomplicated: Secondary | ICD-10-CM | POA: Insufficient documentation

## 2013-12-09 DIAGNOSIS — Z91040 Latex allergy status: Secondary | ICD-10-CM | POA: Insufficient documentation

## 2013-12-09 DIAGNOSIS — K589 Irritable bowel syndrome without diarrhea: Secondary | ICD-10-CM | POA: Insufficient documentation

## 2013-12-09 DIAGNOSIS — J449 Chronic obstructive pulmonary disease, unspecified: Secondary | ICD-10-CM | POA: Insufficient documentation

## 2013-12-09 DIAGNOSIS — J4489 Other specified chronic obstructive pulmonary disease: Secondary | ICD-10-CM | POA: Insufficient documentation

## 2013-12-09 DIAGNOSIS — I10 Essential (primary) hypertension: Secondary | ICD-10-CM | POA: Insufficient documentation

## 2013-12-09 DIAGNOSIS — Z880 Allergy status to penicillin: Secondary | ICD-10-CM | POA: Insufficient documentation

## 2013-12-09 HISTORY — DX: Unspecified asthma, uncomplicated: J45.909

## 2013-12-09 LAB — URINALYSIS, ROUTINE W REFLEX MICROSCOPIC
Bilirubin Urine: NEGATIVE
Glucose, UA: NEGATIVE mg/dL
Hgb urine dipstick: NEGATIVE
Ketones, ur: NEGATIVE mg/dL
Leukocytes, UA: NEGATIVE
NITRITE: NEGATIVE
PROTEIN: NEGATIVE mg/dL
SPECIFIC GRAVITY, URINE: 1.014 (ref 1.005–1.030)
UROBILINOGEN UA: 0.2 mg/dL (ref 0.0–1.0)
pH: 6.5 (ref 5.0–8.0)

## 2013-12-09 LAB — CBC WITH DIFFERENTIAL/PLATELET
BASOS PCT: 0 % (ref 0–1)
Basophils Absolute: 0 10*3/uL (ref 0.0–0.1)
Eosinophils Absolute: 0.2 10*3/uL (ref 0.0–0.7)
Eosinophils Relative: 4 % (ref 0–5)
HCT: 45 % (ref 39.0–52.0)
HEMOGLOBIN: 16.1 g/dL (ref 13.0–17.0)
Lymphocytes Relative: 37 % (ref 12–46)
Lymphs Abs: 1.8 10*3/uL (ref 0.7–4.0)
MCH: 30 pg (ref 26.0–34.0)
MCHC: 35.8 g/dL (ref 30.0–36.0)
MCV: 84 fL (ref 78.0–100.0)
Monocytes Absolute: 0.4 10*3/uL (ref 0.1–1.0)
Monocytes Relative: 7 % (ref 3–12)
NEUTROS ABS: 2.6 10*3/uL (ref 1.7–7.7)
Neutrophils Relative %: 52 % (ref 43–77)
PLATELETS: 200 10*3/uL (ref 150–400)
RBC: 5.36 MIL/uL (ref 4.22–5.81)
RDW: 12.1 % (ref 11.5–15.5)
WBC: 5 10*3/uL (ref 4.0–10.5)

## 2013-12-09 LAB — COMPREHENSIVE METABOLIC PANEL
ALT: 38 U/L (ref 0–53)
AST: 24 U/L (ref 0–37)
Albumin: 3.5 g/dL (ref 3.5–5.2)
Alkaline Phosphatase: 99 U/L (ref 39–117)
BUN: 11 mg/dL (ref 6–23)
CALCIUM: 9.1 mg/dL (ref 8.4–10.5)
CHLORIDE: 101 meq/L (ref 96–112)
CO2: 25 meq/L (ref 19–32)
Creatinine, Ser: 0.99 mg/dL (ref 0.50–1.35)
GFR calc non Af Amer: >90 mL/min (ref >90)
GLUCOSE: 98 mg/dL (ref 70–99)
Potassium: 3.9 mEq/L (ref 3.7–5.3)
SODIUM: 139 meq/L (ref 137–147)
Total Bilirubin: 0.4 mg/dL (ref 0.3–1.2)
Total Protein: 6.6 g/dL (ref 6.0–8.3)

## 2013-12-09 LAB — LIPASE, BLOOD: Lipase: 23 U/L (ref 11–59)

## 2013-12-09 MED ORDER — ONDANSETRON HCL 4 MG/2ML IJ SOLN
4.0000 mg | Freq: Once | INTRAMUSCULAR | Status: AC
  Administered 2013-12-09: 4 mg via INTRAVENOUS
  Filled 2013-12-09: qty 2

## 2013-12-09 MED ORDER — OXYCODONE-ACETAMINOPHEN 5-325 MG PO TABS
2.0000 | ORAL_TABLET | Freq: Once | ORAL | Status: AC
  Administered 2013-12-09: 2 via ORAL
  Filled 2013-12-09: qty 2

## 2013-12-09 MED ORDER — MORPHINE SULFATE 4 MG/ML IJ SOLN
4.0000 mg | Freq: Once | INTRAMUSCULAR | Status: AC
  Administered 2013-12-09: 4 mg via INTRAVENOUS
  Filled 2013-12-09: qty 1

## 2013-12-09 MED ORDER — IOHEXOL 300 MG/ML  SOLN
25.0000 mL | Freq: Once | INTRAMUSCULAR | Status: AC | PRN
  Administered 2013-12-09: 25 mL via ORAL

## 2013-12-09 MED ORDER — OXYCODONE-ACETAMINOPHEN 5-325 MG PO TABS: 1.0000 | ORAL_TABLET | ORAL | Status: DC | PRN

## 2013-12-09 MED ORDER — SODIUM CHLORIDE 0.9 % IV BOLUS (SEPSIS)
1000.0000 mL | Freq: Once | INTRAVENOUS | Status: AC
  Administered 2013-12-09: 1000 mL via INTRAVENOUS

## 2013-12-09 MED ORDER — HYDROMORPHONE HCL PF 1 MG/ML IJ SOLN
1.0000 mg | Freq: Once | INTRAMUSCULAR | Status: AC
  Administered 2013-12-09: 1 mg via INTRAVENOUS
  Filled 2013-12-09: qty 1

## 2013-12-09 MED ORDER — IOHEXOL 300 MG/ML  SOLN
100.0000 mL | Freq: Once | INTRAMUSCULAR | Status: AC | PRN
  Administered 2013-12-09: 100 mL via INTRAVENOUS

## 2013-12-09 NOTE — ED Notes (Signed)
Pt reports r sided abdominal pain since Thursday after having three polyps removed. States he was taking ibuprofen and tylenol and did not realize he was not supposed to be taking it.

## 2013-12-09 NOTE — ED Notes (Signed)
Pt reports having colonoscopy done on Thursday and having severe right side abd pain since then. They removed 3 polyps during procedure. Denies blood in stool but is having n/v and some mild diarrhea since Thursday.

## 2013-12-09 NOTE — ED Provider Notes (Signed)
TIME SEEN: 5:30 PM  CHIEF COMPLAINT: Abdominal pain  HPI: Patient is a 33 year old male with history of COPD, hypertension, bipolar disorder, schizophrenia who presents emergency department with right-sided severe, sharp right-sided abdominal pain without radiation that started on Thursday after having a colonoscopy.  Denies aggravating or relieving factors.  Patient reports that he had a colonoscopy as an outpatient with [facility] gastroenterology. He states 3 polyps were removed. He states he has had pain since waking up and call the office today to request pain medication and instructed him to come to the emergency department. He states he has had normal bowel movements since the colonoscopy. No bloody stools or melena. He has had chronic nausea and vomiting that is unchanged. No fevers but feels he has had chills.  ROS: See HPI Constitutional: no fever  Eyes: no drainage  ENT: no runny nose   Cardiovascular:  no chest pain  Resp: no SOB  GI: no vomiting GU: no dysuria Integumentary: no rash  Allergy: no hives  Musculoskeletal: no leg swelling  Neurological: no slurred speech ROS otherwise negative  PAST MEDICAL HISTORY/PAST SURGICAL HISTORY:  Past Medical History  Diagnosis Date  . COPD (chronic obstructive pulmonary disease)   . Bell's palsy   . IBS (irritable bowel syndrome)   . Hypertension   . Bipolar 1 disorder   . Schizophrenia     MEDICATIONS:  Prior to Admission medications   Medication Sig Start Date End Date Taking? Authorizing Provider  carbamazepine (TEGRETOL XR) 200 MG 12 hr tablet Take 600 mg by mouth at bedtime.     Historical Provider, MD  esomeprazole (NEXIUM) 40 MG capsule Take 1 capsule (40 mg total) by mouth daily. 10/06/13   Carlisle Cater, PA-C  famotidine (PEPCID) 20 MG tablet Take 1 tablet (20 mg total) by mouth 2 (two) times daily. 10/06/13   Carlisle Cater, PA-C  HYDROcodone-acetaminophen (NORCO/VICODIN) 5-325 MG per tablet Take 1-2 tablets by mouth  every 4 (four) hours as needed. 11/11/13   Ephraim Hamburger, MD  ibuprofen (ADVIL,MOTRIN) 600 MG tablet Take 1 tablet (600 mg total) by mouth every 6 (six) hours as needed. 11/11/13   Ephraim Hamburger, MD  lurasidone (LATUDA) 40 MG TABS tablet Take 40 mg by mouth at bedtime.     Historical Provider, MD    ALLERGIES:  Allergies  Allergen Reactions  . Flagyl [Metronidazole] Swelling  . Latex Hives  . Penicillins Swelling  . Phenergan [Promethazine Hcl] Other (See Comments)    Seizure  . Vancomycin Swelling    SOCIAL HISTORY:  History  Substance Use Topics  . Smoking status: Current Every Day Smoker -- 0.50 packs/day for 12 years    Types: Cigarettes  . Smokeless tobacco: Never Used  . Alcohol Use: Yes    FAMILY HISTORY: History reviewed. No pertinent family history.  EXAM: BP 120/78  Pulse 96  Temp(Src) 98.2 F (36.8 C) (Oral)  Resp 18  Ht 6\' 1"  (1.854 m)  Wt 240 lb (108.863 kg)  BMI 31.67 kg/m2  SpO2 94% CONSTITUTIONAL: Alert and oriented and responds appropriately to questions. Well-appearing; well-nourished HEAD: Normocephalic EYES: Conjunctivae clear, PERRL ENT: normal nose; no rhinorrhea; moist mucous membranes; pharynx without lesions noted NECK: Supple, no meningismus, no LAD  CARD: RRR; S1 and S2 appreciated; no murmurs, no clicks, no rubs, no gallops RESP: Normal chest excursion without splinting or tachypnea; breath sounds clear and equal bilaterally; no wheezes, no rhonchi, no rales,  ABD/GI: Normal bowel sounds; non-distended; soft but  diffusely tender to palpation with voluntary guarding in the right abdomen diffusely, no peritoneal signs BACK:  The back appears normal and is non-tender to palpation, there is no CVA tenderness EXT: Normal ROM in all joints; non-tender to palpation; no edema; normal capillary refill; no cyanosis    SKIN: Normal color for age and race; warm NEURO: Moves all extremities equally PSYCH: The patient's mood and manner are  appropriate. Grooming and personal hygiene are appropriate.  MEDICAL DECISION MAKING: Patient here with abdominal pain after colonoscopy. He is tender to palpation diffusely in his right abdomen on exam but is nontoxic appearing. His labs and urine were completely unremarkable. We'll start with an acute abdominal series to evaluate for perforation.  If negative, anticipate patient will need a CT of his abdomen given his tenderness on exam. Will consult his gastroenterologist. We'll give IV fluids and pain medication.  ED PROGRESS: Patient's x-ray shows no free air, bowel obstruction. We'll obtain a CT of his abdomen and pelvis.   9:52 PM  Pt's CT scan is relatively unremarkable but did show some mild inflammation of the terminal ileum. Discussed with Dr. Truman Hayward with [facility] gastroenterology who reports his endoscopy showed changes consistent with reflux and his colonoscopy showed polyps but was otherwise unremarkable. At that time his ileum was normal. Given patient is well-appearing, nontoxic with normal vital signs, he can be discharged home. We'll discharge with a small prescription for pain medication. He appears patient also frequently visits the [facility] emergency Department has had multiple CT scans in the past 6 months. Patient has chronic abdominal pain and this may be the source of his pain today. Have also discussed this with patient and his wife at bedside. Have discussed risk of radiation exposure. Discussed return precautions and supportive care instructions. They verbalize understanding and are comfortable with plan.  [place], DO 12/09/13 2207

## 2013-12-09 NOTE — Discharge Instructions (Signed)
Abdominal Pain, Adult Many things can cause abdominal pain. Usually, abdominal pain is not caused by a disease and will improve without treatment. It can often be observed and treated at home. Your health care provider will do a physical exam and possibly order blood tests and X-rays to help determine the seriousness of your pain. However, in many cases, more time must pass before a clear cause of the pain can be found. Before that point, your health care provider may not know if you need more testing or further treatment. HOME CARE INSTRUCTIONS  Monitor your abdominal pain for any changes. The following actions may help to alleviate any discomfort you are experiencing:  Only take over-the-counter or prescription medicines as directed by your health care provider.  Do not take laxatives unless directed to do so by your health care provider.  Try a clear liquid diet (broth, tea, or water) as directed by your health care provider. Slowly move to a bland diet as tolerated. SEEK MEDICAL CARE IF:  You have unexplained abdominal pain.  You have abdominal pain associated with nausea or diarrhea.  You have pain when you urinate or have a bowel movement.  You experience abdominal pain that wakes you in the night.  You have abdominal pain that is worsened or improved by eating food.  You have abdominal pain that is worsened with eating fatty foods. SEEK IMMEDIATE MEDICAL CARE IF:   Your pain does not go away within 2 hours.  You have a fever.  You keep throwing up (vomiting).  Your pain is felt only in portions of the abdomen, such as the right side or the left lower portion of the abdomen.  You pass bloody or black tarry stools. MAKE SURE YOU:  Understand these instructions.   Will watch your condition.   Will get help right away if you are not doing well or get worse.  Document Released: 04/13/2005 Document Revised: 04/24/2013 Document Reviewed: 03/13/2013 [facility] Patient  Information 2014 [place]. Colonoscopy, Care After Refer to this sheet in the next few weeks. These instructions provide you with information on caring for yourself after your procedure. Your health care provider may also give you more specific instructions. Your treatment has been planned according to current medical practices, but problems sometimes occur. Call your health care provider if you have any problems or questions after your procedure. WHAT TO EXPECT AFTER THE PROCEDURE  After your procedure, it is typical to have the following:  A small amount of blood in your stool.  Moderate amounts of gas and mild abdominal cramping or bloating. HOME CARE INSTRUCTIONS  Do not drive, operate machinery, or sign important documents for 24 hours.  You may shower and resume your regular physical activities, but move at a slower pace for the first 24 hours.  Take frequent rest periods for the first 24 hours.  Walk around or put a warm pack on your abdomen to help reduce abdominal cramping and bloating.  Drink enough fluids to keep your urine clear or pale yellow.  You may resume your normal diet as instructed by your health care provider. Avoid heavy or fried foods that are hard to digest.  Avoid drinking alcohol for 24 hours or as instructed by your health care provider.  Only take over-the-counter or prescription medicines as directed by your health care provider.  If a tissue sample (biopsy) was taken during your procedure:  Do not take aspirin or blood thinners for 7 days, or as instructed by  your health care provider.  Do not drink alcohol for 7 days, or as instructed by your health care provider.  Eat soft foods for the first 24 hours. SEEK MEDICAL CARE IF: You have persistent spotting of blood in your stool 2 3 days after the procedure. SEEK IMMEDIATE MEDICAL CARE IF:  You have more than a small spotting of blood in your stool.  You pass large blood clots in your  stool.  Your abdomen is swollen (distended).  You have nausea or vomiting.  You have a fever.  You have increasing abdominal pain that is not relieved with medicine. Document Released: 02/16/2004 Document Revised: 04/24/2013 Document Reviewed: 03/11/2013 [facility] Patient Information 2014 [place].

## 2013-12-10 MED ORDER — PHENYLEPHRINE HCL 10 MG/ML IJ SOLN
INTRAMUSCULAR | Status: AC
  Filled 2013-12-10: qty 1

## 2013-12-10 MED ORDER — NEOSTIGMINE METHYLSULFATE 10 MG/10ML IV SOLN
INTRAVENOUS | Status: AC
  Filled 2013-12-10: qty 1

## 2013-12-10 MED ORDER — HEPARIN SODIUM (PORCINE) 1000 UNIT/ML IJ SOLN
INTRAMUSCULAR | Status: AC
  Filled 2013-12-10: qty 1

## 2013-12-10 MED ORDER — LIDOCAINE HCL (CARDIAC) 20 MG/ML IV SOLN
INTRAVENOUS | Status: AC
  Filled 2013-12-10: qty 5

## 2013-12-10 MED ORDER — GLYCOPYRROLATE 0.2 MG/ML IJ SOLN
INTRAMUSCULAR | Status: AC
  Filled 2013-12-10: qty 2

## 2013-12-10 MED ORDER — PROPOFOL 10 MG/ML IV BOLUS
INTRAVENOUS | Status: AC
  Filled 2013-12-10: qty 20

## 2013-12-10 MED ORDER — ROCURONIUM BROMIDE 50 MG/5ML IV SOLN
INTRAVENOUS | Status: AC
  Filled 2013-12-10: qty 1

## 2013-12-10 MED ORDER — ONDANSETRON HCL 4 MG/2ML IJ SOLN
INTRAMUSCULAR | Status: AC
  Filled 2013-12-10: qty 2

## 2013-12-10 MED ORDER — ARTIFICIAL TEARS OP OINT
TOPICAL_OINTMENT | OPHTHALMIC | Status: AC
  Filled 2013-12-10: qty 3.5

## 2013-12-10 MED ORDER — SUCCINYLCHOLINE CHLORIDE 20 MG/ML IJ SOLN
INTRAMUSCULAR | Status: AC
  Filled 2013-12-10: qty 1

## 2013-12-10 MED ORDER — FENTANYL CITRATE 0.05 MG/ML IJ SOLN
INTRAMUSCULAR | Status: AC
  Filled 2013-12-10: qty 5

## 2014-01-05 ENCOUNTER — Emergency Department: Payer: Self-pay | Admitting: Emergency Medicine

## 2014-01-05 LAB — COMPREHENSIVE METABOLIC PANEL
ALBUMIN: 3.5 g/dL (ref 3.4–5.0)
ANION GAP: 6 — AB (ref 7–16)
AST: 44 U/L — AB (ref 15–37)
Alkaline Phosphatase: 105 U/L
BUN: 7 mg/dL (ref 7–18)
Bilirubin,Total: 0.4 mg/dL (ref 0.2–1.0)
Calcium, Total: 8.8 mg/dL (ref 8.5–10.1)
Chloride: 105 mmol/L (ref 98–107)
Co2: 27 mmol/L (ref 21–32)
Creatinine: 1.14 mg/dL (ref 0.60–1.30)
EGFR (Non-African Amer.): >60
GLUCOSE: 114 mg/dL — AB (ref 65–99)
Osmolality: 275 (ref 275–301)
Potassium: 3.6 mmol/L (ref 3.5–5.1)
SGPT (ALT): 49 U/L (ref 12–78)
Sodium: 138 mmol/L (ref 136–145)
TOTAL PROTEIN: 6.8 g/dL (ref 6.4–8.2)

## 2014-01-05 LAB — CBC WITH DIFFERENTIAL/PLATELET
Basophil #: 0 10*3/uL (ref 0.0–0.1)
Basophil %: 0.9 %
Eosinophil #: 0.2 10*3/uL (ref 0.0–0.7)
Eosinophil %: 4.9 %
HCT: 46.7 % (ref 40.0–52.0)
HGB: 15.8 g/dL (ref 13.0–18.0)
LYMPHS ABS: 1.3 10*3/uL (ref 1.0–3.6)
LYMPHS PCT: 27.5 %
MCH: 29.5 pg (ref 26.0–34.0)
MCHC: 33.9 g/dL (ref 32.0–36.0)
MCV: 87 fL (ref 80–100)
MONOS PCT: 8.1 %
Monocyte #: 0.4 x10 3/mm (ref 0.2–1.0)
Neutrophil #: 2.8 10*3/uL (ref 1.4–6.5)
Neutrophil %: 58.6 %
Platelet: 210 10*3/uL (ref 150–440)
RBC: 5.36 10*6/uL (ref 4.40–5.90)
RDW: 12.6 % (ref 11.5–14.5)
WBC: 4.7 10*3/uL (ref 3.8–10.6)

## 2014-01-05 LAB — URINALYSIS, COMPLETE
BILIRUBIN, UR: NEGATIVE
Bacteria: NONE SEEN
Blood: NEGATIVE
Glucose,UR: NEGATIVE mg/dL (ref 0–75)
KETONE: NEGATIVE
LEUKOCYTE ESTERASE: NEGATIVE
NITRITE: NEGATIVE
Ph: 6 (ref 4.5–8.0)
Protein: NEGATIVE
SQUAMOUS EPITHELIAL: NONE SEEN
Specific Gravity: 1.014 (ref 1.003–1.030)

## 2014-01-16 ENCOUNTER — Emergency Department (HOSPITAL_COMMUNITY): Payer: BC Managed Care – PPO

## 2014-01-16 ENCOUNTER — Encounter (HOSPITAL_COMMUNITY): Payer: Self-pay | Admitting: Emergency Medicine

## 2014-01-16 ENCOUNTER — Observation Stay (HOSPITAL_COMMUNITY)
Admission: EM | Admit: 2014-01-16 | Discharge: 2014-01-17 | Discharge status: Against Medical Advice | Payer: BC Managed Care – PPO | Attending: Internal Medicine | Admitting: Internal Medicine

## 2014-01-16 DIAGNOSIS — Z9089 Acquired absence of other organs: Secondary | ICD-10-CM | POA: Insufficient documentation

## 2014-01-16 DIAGNOSIS — J449 Chronic obstructive pulmonary disease, unspecified: Secondary | ICD-10-CM | POA: Insufficient documentation

## 2014-01-16 DIAGNOSIS — J4489 Other specified chronic obstructive pulmonary disease: Secondary | ICD-10-CM | POA: Insufficient documentation

## 2014-01-16 DIAGNOSIS — Z8249 Family history of ischemic heart disease and other diseases of the circulatory system: Secondary | ICD-10-CM | POA: Insufficient documentation

## 2014-01-16 DIAGNOSIS — F209 Schizophrenia, unspecified: Secondary | ICD-10-CM | POA: Insufficient documentation

## 2014-01-16 DIAGNOSIS — F17200 Nicotine dependence, unspecified, uncomplicated: Secondary | ICD-10-CM | POA: Insufficient documentation

## 2014-01-16 DIAGNOSIS — R079 Chest pain, unspecified: Secondary | ICD-10-CM | POA: Diagnosis present

## 2014-01-16 DIAGNOSIS — I10 Essential (primary) hypertension: Secondary | ICD-10-CM | POA: Insufficient documentation

## 2014-01-16 DIAGNOSIS — Z720 Tobacco use: Secondary | ICD-10-CM | POA: Diagnosis present

## 2014-01-16 DIAGNOSIS — F319 Bipolar disorder, unspecified: Secondary | ICD-10-CM | POA: Insufficient documentation

## 2014-01-16 DIAGNOSIS — Z79899 Other long term (current) drug therapy: Secondary | ICD-10-CM | POA: Insufficient documentation

## 2014-01-16 DIAGNOSIS — R0789 Other chest pain: Principal | ICD-10-CM | POA: Insufficient documentation

## 2014-01-16 LAB — CBC
HEMATOCRIT: 43.7 % (ref 39.0–52.0)
Hemoglobin: 15.6 g/dL (ref 13.0–17.0)
MCH: 30.1 pg (ref 26.0–34.0)
MCHC: 35.7 g/dL (ref 30.0–36.0)
MCV: 84.2 fL (ref 78.0–100.0)
Platelets: 197 10*3/uL (ref 150–400)
RBC: 5.19 MIL/uL (ref 4.22–5.81)
RDW: 12.3 % (ref 11.5–15.5)
WBC: 6.4 10*3/uL (ref 4.0–10.5)

## 2014-01-16 LAB — COMPREHENSIVE METABOLIC PANEL
ALT: 42 U/L (ref 0–53)
ANION GAP: 13 (ref 5–15)
AST: 30 U/L (ref 0–37)
Albumin: 3.7 g/dL (ref 3.5–5.2)
Alkaline Phosphatase: 89 U/L (ref 39–117)
BILIRUBIN TOTAL: 0.4 mg/dL (ref 0.3–1.2)
BUN: 6 mg/dL (ref 6–23)
CHLORIDE: 99 meq/L (ref 96–112)
CO2: 26 meq/L (ref 19–32)
Calcium: 9.1 mg/dL (ref 8.4–10.5)
Creatinine, Ser: 1.1 mg/dL (ref 0.50–1.35)
GFR calc Af Amer: >90 mL/min (ref >90)
GFR, EST NON AFRICAN AMERICAN: 89 mL/min — AB (ref >90)
Glucose, Bld: 99 mg/dL (ref 70–99)
Potassium: 4.2 mEq/L (ref 3.7–5.3)
Sodium: 138 mEq/L (ref 137–147)
Total Protein: 6.6 g/dL (ref 6.0–8.3)

## 2014-01-16 LAB — PRO B NATRIURETIC PEPTIDE: Pro B Natriuretic peptide (BNP): 10.2 pg/mL (ref 0–125)

## 2014-01-16 LAB — I-STAT TROPONIN, ED: Troponin i, poc: 0 ng/mL (ref 0.00–0.08)

## 2014-01-16 MED ORDER — MORPHINE SULFATE 4 MG/ML IJ SOLN
4.0000 mg | Freq: Once | INTRAMUSCULAR | Status: AC
  Administered 2014-01-16: 4 mg via INTRAVENOUS
  Filled 2014-01-16: qty 1

## 2014-01-16 MED ORDER — ASPIRIN 325 MG PO TABS
325.0000 mg | ORAL_TABLET | Freq: Once | ORAL | Status: AC
  Administered 2014-01-16: 325 mg via ORAL
  Filled 2014-01-16: qty 1

## 2014-01-16 MED ORDER — NITROGLYCERIN 0.4 MG SL SUBL
0.4000 mg | SUBLINGUAL_TABLET | SUBLINGUAL | Status: AC | PRN
  Administered 2014-01-16 – 2014-01-17 (×3): 0.4 mg via SUBLINGUAL
  Filled 2014-01-16 (×3): qty 1

## 2014-01-16 MED ORDER — GI COCKTAIL ~~LOC~~
30.0000 mL | Freq: Once | ORAL | Status: AC
  Administered 2014-01-16: 30 mL via ORAL
  Filled 2014-01-16: qty 30

## 2014-01-16 MED ORDER — FAMOTIDINE IN NACL 20-0.9 MG/50ML-% IV SOLN
20.0000 mg | Freq: Once | INTRAVENOUS | Status: AC
  Administered 2014-01-16: 20 mg via INTRAVENOUS
  Filled 2014-01-16: qty 50

## 2014-01-16 NOTE — ED Notes (Signed)
Patient transported to X-ray via stretcher Patient in NAD upon leaving for testing 

## 2014-01-16 NOTE — ED Notes (Signed)
Patient arrives to ED with c/o centralized CP that radiates to the left arm Patient describes the pain as "pressure" in his chest with "tingling down the left side." Patient states that he had a similar episode "years ago" and underwent a NST,but does not remember the outcome of that Patient alert and oriented x 4

## 2014-01-16 NOTE — H&P (Signed)
PCP:  none   Chief Complaint:  Chest pain  HPI: Nathaniel [name] is a 33 y.o. male   has a past medical history of COPD (chronic obstructive pulmonary disease); Bell's palsy; IBS (irritable bowel syndrome); Hypertension; Bipolar 1 disorder; Schizophrenia; and Asthma.   Presented with  For the past few days he have had intermittent chest tightness. He was sitting watching TV today and started to have severe chest tightness. HE was brought to ER. ECG and trop unremarkable but given hx of HTN and family hx of CAD with early onset hospitalist was asked to admit. Patient states that taking deep breath makes him feel short of breath.  Denies any fever or chills.  Pain was slightly improved after getting nitro x2. Of note in 2012 he had a negative cardiac Ct with calcium score of 0 Hospitalist was called for admission for Chest pain  Review of Systems:    Pertinent positives include chest pain,  shortness of breath at rest.  Constitutional:  No weight loss, night sweats, Fevers, chills, fatigue, weight loss  HEENT:  No headaches, Difficulty swallowing,Tooth/dental problems,Sore throat,  No sneezing, itching, ear ache, nasal congestion, post nasal drip,  Cardio-vascular:  No  Orthopnea, PND, anasarca, dizziness, palpitations.no Bilateral lower extremity swelling  GI:  No heartburn, indigestion, abdominal pain, nausea, vomiting, diarrhea, change in bowel habits, loss of appetite, melena, blood in stool, hematemesis Resp:  no No dyspnea on exertion, No excess mucus, no productive cough, No non-productive cough, No coughing up of blood.No change in color of mucus.No wheezing. Skin:  no rash or lesions. No jaundice GU:  no dysuria, change in color of urine, no urgency or frequency. No straining to urinate.  No flank pain.  Musculoskeletal:  No joint pain or no joint swelling. No decreased range of motion. No back pain.  Psych:  No change in mood or affect. No depression or anxiety. No  memory loss.  Neuro: no localizing neurological complaints, no tingling, no weakness, no double vision, no gait abnormality, no slurred speech, no confusion  Otherwise ROS are negative except for above, 10 systems were reviewed  Past Medical History: Past Medical History  Diagnosis Date  . COPD (chronic obstructive pulmonary disease)   . Bell's palsy   . IBS (irritable bowel syndrome)   . Hypertension   . Bipolar 1 disorder   . Schizophrenia   . Asthma    Past Surgical History  Procedure Laterality Date  . Cholecystectomy    . Abdominal surgery    . Incision and drainage Left 02/05/2013    Dr Rolena Infante  . Knee arthroscopy with medial menisectomy Left 02/05/2013    Procedure: KNEE ARTHROSCOPY  ;  Surgeon: Melina Schools, MD;  Location: [place];  Service: Orthopedics;  Laterality: Left;  . Irrigation and debridement knee  02/05/2013    Procedure: IRRIGATION AND DEBRIDEMENT KNEE patella bursa;  Surgeon: Melina Schools, MD;  Location: [facility];  Service: Orthopedics;;     Medications: Prior to Admission medications   Medication Sig Start Date End Date Taking? Authorizing Provider  budesonide-formoterol (SYMBICORT) 160-4.5 MCG/ACT inhaler Inhale 2 puffs into the lungs See admin instructions. Take 3 times daily (scheduled) and another 2 times daily as needed for copd   Yes Historical Provider, MD  carbamazepine (TEGRETOL XR) 200 MG 12 hr tablet Take 600 mg by mouth daily.    Yes Historical Provider, MD  hydrOXYzine (VISTARIL) 50 MG capsule Take 50 mg by mouth 3 (three) times daily.  Yes Historical Provider, MD  lurasidone (LATUDA) 40 MG TABS tablet Take 40 mg by mouth daily.    Yes Historical Provider, MD  pantoprazole (PROTONIX) 40 MG tablet Take 40 mg by mouth 2 (two) times daily.   Yes Historical Provider, MD  ranitidine (ZANTAC) 150 MG tablet Take 150 mg by mouth 2 (two) times daily.   Yes Historical Provider, MD    Allergies:   Allergies  Allergen Reactions  . Penicillins Anaphylaxis    . Flagyl [Metronidazole] Swelling  . Latex Hives  . Phenergan [Promethazine Hcl] Other (See Comments)    Seizure  . Vancomycin Swelling    Throat closed up    Social History:  Ambulatory   independently   Lives at home  With family     reports that he has been smoking Cigarettes.  He has a 6 pack-year smoking history. He has never used smokeless tobacco. He reports that he drinks alcohol. He reports that he does not use illicit drugs.    Family History: family history includes CAD in his father; Stroke in his mother.    Physical Exam: Patient Vitals for the past 24 hrs:  BP Temp Temp src Pulse Resp SpO2  01/16/14 2230 115/72 mmHg - - 92 18 96 %  01/16/14 2226 131/92 mmHg - - 79 22 96 %  01/16/14 2200 113/80 mmHg - - - - -  01/16/14 2130 108/76 mmHg - - 85 16 98 %  01/16/14 2122 111/73 mmHg - - 45 23 90 %  01/16/14 2100 118/77 mmHg - - 80 17 97 %  01/16/14 2035 122/80 mmHg 97.6 F (36.4 C) Oral 88 22 96 %  01/16/14 2033 122/80 mmHg - - 84 19 95 %    1. General:  in No Acute distress 2. Psychological: Alert and Oriented flat affect 3. Head/ENT:   Moist   Mucous Membranes                          Head Non traumatic, neck supple                          Normal Dentition 4. SKIN: normal Skin turgor,  Skin clean Dry and intact no rash 5. Heart: Regular rate and rhythm no Murmur, Rub or gallop 6. Lungs: no wheezes occasionally coarse breath sounds 7. Abdomen: Soft, non-tender, Non distended obese 8. Lower extremities: no clubbing, cyanosis, or edema 9. Neurologically Grossly intact, moving all 4 extremities equally 10. MSK: Normal range of motion  body mass index is unknown because there is no weight on file.   Labs on Admission:   Recent Labs  01/16/14 2041  NA 138  K 4.2  CL 99  CO2 26  GLUCOSE 99  BUN 6  CREATININE 1.10  CALCIUM 9.1    Recent Labs  01/16/14 2041  AST 30  ALT 42  ALKPHOS 89  BILITOT 0.4  PROT 6.6  ALBUMIN 3.7   No results found  for this basename: LIPASE, AMYLASE,  in the last 72 hours  Recent Labs  01/16/14 2041  WBC 6.4  HGB 15.6  HCT 43.7  MCV 84.2  PLT 197    Recent Labs  01/16/14 2356  TROPONINI <0.30   No results found for this basename: TSH, T4TOTAL, FREET3, T3FREE, THYROIDAB,  in the last 72 hours No results found for this basename: VITAMINB12, FOLATE, FERRITIN, TIBC, IRON, RETICCTPCT,  in the  last 72 hours Lab Results  Component Value Date   HGBA1C 5.2 02/06/2013    The CrCl is unknown because both a height and weight (above a minimum accepted value) are required for this calculation. ABG    Component Value Date/Time   PHART 7.352 02/05/2013 1000   HCO3 24.7* 02/05/2013 1000   TCO2 26.1 02/05/2013 1000   ACIDBASEDEF 0.2 02/05/2013 1000   O2SAT 95.7 02/05/2013 1000     Lab Results  Component Value Date   DDIMER <0.27 01/16/2014     Other results:  I have pearsonaly reviewed this: ECG REPORT  Rate: 86  Rhythm: SR ST&T Change: no ischemic changes  BNP (last 3 results)  Recent Labs  10/06/13 2010 01/16/14 2041  PROBNP 5.3 10.2    There were no vitals filed for this visit.   Cultures:    Component Value Date/Time   SDES WOUND LEFT KNEE 02/05/2013 0724   SDES WOUND LEFT KNEE 02/05/2013 0724   SDES WOUND LEFT KNEE 02/05/2013 0724   SPECREQUEST NUMBER 2,PT ON CLEOCYIN  02/05/2013 0724   SPECREQUEST NUMBER 2,PT ON CLEOCYIN 02/05/2013 0724   SPECREQUEST NUMBER 2,PT ON CLEOCYIN 02/05/2013 0724   CULT NO GROWTH 2 DAYS 02/05/2013 0724   CULT NO ANAEROBES ISOLATED 02/05/2013 0724   REPTSTATUS 02/07/2013 FINAL 02/05/2013 0724   REPTSTATUS 02/10/2013 FINAL 02/05/2013 0724   REPTSTATUS 02/05/2013 FINAL 02/05/2013 0724   Radiological Exams on Admission: Dg Chest 2 View  01/16/2014   CLINICAL DATA:  Chest pain, shortness of breath, dizziness  EXAM: CHEST  2 VIEW  COMPARISON:  06/21/2013  FINDINGS: The heart size and mediastinal contours are within normal limits. Both lungs are clear. The  visualized skeletal structures are unremarkable.  IMPRESSION: No active cardiopulmonary disease.   Electronically Signed   By: Kathreen Devoid   On: 01/16/2014 21:29    Chart has been reviewed  Assessment/Plan 33 year old gentleman with family history of early onset coronary artery disease and personal history of hypertension tobacco abuse with negative cardiac CT in 2012 presents with ongoing chest pain somewhat improved with nitroglycerin Present on Admission:  . Chest pain - chest pain has been persistent all although vaccine and waning quality. For past 5 hours with negative troponins and no change in EKG. Recent negative cardiac CT scan is also somewhat reassuring We'll cycle cardiac enzymesand serial ECG, Echo in AM  obtain lipid panel and hemoglobin A1c cardiology consult in a.m. Spoke to Dr. Elias Else with cardiology  . HTN (hypertension) - currently well controlled continue to monitor  . Tobacco abuse spoke about importance of quitting - tobacco cessation counseling ordered    Prophylaxis:  Lovenox, Protonix  CODE STATUS:  FULL CODE   Other plan as per orders.  I have spent a total of 55 min on this admission  Maddox Hlavaty 01/16/2014, 11:34 PM  [facility]  Pager [phone]   If 7AM-7PM, please contact the day team taking care of the patient  [email or web address]  Password [email or web address]

## 2014-01-16 NOTE — ED Notes (Signed)
Patient back from radiology Patient in NAD 

## 2014-01-16 NOTE — ED Notes (Signed)
Patient reports that chest pain/"pressure" has decreased to 5/10 after admin of pain medication, see MAR Patient resting in bed, watching TV and in NAD VS updated and stable

## 2014-01-16 NOTE — ED Notes (Signed)
Patient denies any c/o pain, stating "I ain't got no pain now" s/p admin of 1 tab SL NTG--see MAR VS updated and stable Patient watching TV and appears in NAD Call bell in reach

## 2014-01-16 NOTE — ED Provider Notes (Signed)
CSN: [id number]     Arrival date & time 01/16/14  2029 History   First MD Initiated Contact with Patient 01/16/14 2035     Chief Complaint  Patient presents with  . Chest Pain     (Consider location/radiation/quality/duration/timing/severity/associated sxs/prior Treatment) The history is provided by the patient.  Nathaniel [name] is a 33 y.o. male hx of COPD, bipolar, schizophrenia here with chest pain. He has been having intermittent chest pain over the last several months. About an hour ago he was watching TV and he had pressure in his chest with tingling down left arm. He had some shortness of breath associated with it. He had similar episode several months ago came to the ER and had negative troponin. He had a stress test years ago but he doesn't remember result. Father has CAD but hs has no known CAD. Had HTN, HL, is a smoker.    Past Medical History  Diagnosis Date  . COPD (chronic obstructive pulmonary disease)   . Bell's palsy   . IBS (irritable bowel syndrome)   . Hypertension   . Bipolar 1 disorder   . Schizophrenia   . Asthma    Past Surgical History  Procedure Laterality Date  . Cholecystectomy    . Abdominal surgery    . Incision and drainage Left 02/05/2013    Dr Rolena Infante  . Knee arthroscopy with medial menisectomy Left 02/05/2013    Procedure: KNEE ARTHROSCOPY  ;  Surgeon: Melina Schools, MD;  Location: [facility];  Service: Orthopedics;  Laterality: Left;  . Irrigation and debridement knee  02/05/2013    Procedure: IRRIGATION AND DEBRIDEMENT KNEE patella bursa;  Surgeon: Melina Schools, MD;  Location: [facility];  Service: Orthopedics;;   History reviewed. No pertinent family history. History  Substance Use Topics  . Smoking status: Current Every Day Smoker -- 0.50 packs/day for 12 years    Types: Cigarettes  . Smokeless tobacco: Never Used  . Alcohol Use: Yes    Review of Systems  Cardiovascular: Positive for chest pain.  All other systems reviewed and are  negative.     Allergies  Penicillins; Flagyl; Latex; Phenergan; and Vancomycin  Home Medications   Prior to Admission medications   Medication Sig Start Date End Date Taking? Authorizing Provider  budesonide-formoterol (SYMBICORT) 160-4.5 MCG/ACT inhaler Inhale 2 puffs into the lungs See admin instructions. Take 3 times daily (scheduled) and another 2 times daily as needed for copd   Yes Historical Provider, MD  carbamazepine (TEGRETOL XR) 200 MG 12 hr tablet Take 600 mg by mouth daily.    Yes Historical Provider, MD  hydrOXYzine (VISTARIL) 50 MG capsule Take 50 mg by mouth 3 (three) times daily.   Yes Historical Provider, MD  lurasidone (LATUDA) 40 MG TABS tablet Take 40 mg by mouth daily.    Yes Historical Provider, MD  pantoprazole (PROTONIX) 40 MG tablet Take 40 mg by mouth 2 (two) times daily.   Yes Historical Provider, MD  ranitidine (ZANTAC) 150 MG tablet Take 150 mg by mouth 2 (two) times daily.   Yes Historical Provider, MD   BP 115/72  Pulse 92  Temp(Src) 97.6 F (36.4 C) (Oral)  Resp 18  SpO2 96% Physical Exam  Nursing note and vitals reviewed. Constitutional: He is oriented to person, place, and time.  Uncomfortable   HENT:  Head: Normocephalic.  Mouth/Throat: Oropharynx is clear and moist.  Eyes: Conjunctivae and EOM are normal. Pupils are equal, round, and reactive to light.  Neck: Normal  range of motion. Neck supple.  Cardiovascular: Normal rate, regular rhythm and normal heart sounds.   Pulmonary/Chest: Effort normal and breath sounds normal. No respiratory distress. He has no wheezes. He has no rales.  ? Reproducible tenderness L side   Abdominal: Soft. Bowel sounds are normal. He exhibits no distension. There is no tenderness. There is no rebound.  Musculoskeletal: Normal range of motion. He exhibits no edema and no tenderness.  Neurological: He is alert and oriented to person, place, and time. No cranial nerve deficit. Coordination normal.  Skin: Skin is  warm and dry.  Psychiatric: He has a normal mood and affect. His behavior is normal. Judgment and thought content normal.    ED Course  Procedures (including critical care time) Labs Review Labs Reviewed  COMPREHENSIVE METABOLIC PANEL - Abnormal; Notable for the following:    GFR calc non Af Amer 89 (*)    All other components within normal limits  CBC  PRO B NATRIURETIC PEPTIDE  I-STAT TROPOININ, ED    Imaging Review Dg Chest 2 View  01/16/2014   CLINICAL DATA:  Chest pain, shortness of breath, dizziness  EXAM: CHEST  2 VIEW  COMPARISON:  06/21/2013  FINDINGS: The heart size and mediastinal contours are within normal limits. Both lungs are clear. The visualized skeletal structures are unremarkable.  IMPRESSION: No active cardiopulmonary disease.   Electronically Signed   By: Kathreen Devoid   On: 01/16/2014 21:29     EKG Interpretation   Date/Time:  Thursday January 16 2014 20:33:20 EDT Ventricular Rate:  86 PR Interval:  134 QRS Duration: 103 QT Interval:  349 QTC Calculation: 417 R Axis:   10 Text Interpretation:  Sinus rhythm Low voltage, precordial leads RSR' in  V1 or V2, right VCD or RVH No significant change since last tracing  Confirmed by YAO  MD, DAVID ([id number]) on 01/16/2014 8:36:12 PM      MDM   Final diagnoses:  None   Nathaniel [name] is a 33 y.o. male here with chest pain. Previous evaluation has shown reflux. Consider ACS but he is young and has no hx of CAD. Will get trop.   10:57 PM After GI cocktail, still has pain. Given morphine with no relief. Given nitro and that relieves the pain. Repeat ekg unchanged. Will admit for observation.      Wandra Arthurs, MD 01/16/14 2312

## 2014-01-16 NOTE — ED Notes (Signed)
Patient states that he is still experiencing "chest pressure" to the middle of his chest, which he states radiates down the left arm Patient rates chest pressure 9/10 on pain scale No SOB/DOE, patient is not diaphoretic--LCTA on RA Patient medicated, see MAR VS updated and stable No EKG changes noted on cardiac monitor

## 2014-01-17 ENCOUNTER — Ambulatory Visit (HOSPITAL_COMMUNITY)
Admit: 2014-01-17 | Discharge: 2014-01-17 | Disposition: A | Discharge status: Home / Self Care | Payer: BC Managed Care – PPO | Attending: Cardiology | Admitting: Cardiology

## 2014-01-17 ENCOUNTER — Other Ambulatory Visit: Payer: Self-pay

## 2014-01-17 ENCOUNTER — Encounter (HOSPITAL_COMMUNITY): Payer: Self-pay | Admitting: Cardiology

## 2014-01-17 DIAGNOSIS — R209 Unspecified disturbances of skin sensation: Secondary | ICD-10-CM | POA: Insufficient documentation

## 2014-01-17 DIAGNOSIS — R072 Precordial pain: Secondary | ICD-10-CM

## 2014-01-17 DIAGNOSIS — R079 Chest pain, unspecified: Secondary | ICD-10-CM | POA: Insufficient documentation

## 2014-01-17 DIAGNOSIS — R0602 Shortness of breath: Secondary | ICD-10-CM

## 2014-01-17 DIAGNOSIS — I10 Essential (primary) hypertension: Secondary | ICD-10-CM

## 2014-01-17 DIAGNOSIS — F17200 Nicotine dependence, unspecified, uncomplicated: Secondary | ICD-10-CM | POA: Insufficient documentation

## 2014-01-17 DIAGNOSIS — J449 Chronic obstructive pulmonary disease, unspecified: Secondary | ICD-10-CM | POA: Diagnosis present

## 2014-01-17 LAB — RAPID URINE DRUG SCREEN, HOSP PERFORMED
AMPHETAMINES: NOT DETECTED
Barbiturates: NOT DETECTED
Benzodiazepines: NOT DETECTED
Cocaine: NOT DETECTED
Opiates: POSITIVE — AB
Tetrahydrocannabinol: NOT DETECTED

## 2014-01-17 LAB — LIPID PANEL
CHOLESTEROL: 207 mg/dL — AB (ref 0–200)
HDL: 29 mg/dL — ABNORMAL LOW (ref >39)
LDL Cholesterol: 131 mg/dL — ABNORMAL HIGH (ref 0–99)
TRIGLYCERIDES: 236 mg/dL — AB (ref <150)
Total CHOL/HDL Ratio: 7.1 RATIO
VLDL: 47 mg/dL — AB (ref 0–40)

## 2014-01-17 LAB — TROPONIN I
Troponin I: <0.3 ng/mL (ref <0.30)
Troponin I: <0.3 ng/mL (ref <0.30)

## 2014-01-17 LAB — D-DIMER, QUANTITATIVE: D-Dimer, Quant: <0.27 ug/mL-FEU (ref 0.00–0.48)

## 2014-01-17 MED ORDER — REGADENOSON 0.4 MG/5ML IV SOLN
INTRAVENOUS | Status: AC
  Administered 2014-01-17: 0.4 mg via INTRAVENOUS
  Filled 2014-01-17: qty 5

## 2014-01-17 MED ORDER — TECHNETIUM TC 99M SESTAMIBI GENERIC - CARDIOLITE
10.0000 | Freq: Once | INTRAVENOUS | Status: AC | PRN
  Administered 2014-01-17: 10 via INTRAVENOUS

## 2014-01-17 MED ORDER — HYDROXYZINE HCL 50 MG PO TABS
50.0000 mg | ORAL_TABLET | Freq: Three times a day (TID) | ORAL | Status: DC
  Administered 2014-01-17: 50 mg via ORAL
  Filled 2014-01-17 (×3): qty 1

## 2014-01-17 MED ORDER — TECHNETIUM TC 99M SESTAMIBI - CARDIOLITE
30.0000 | Freq: Once | INTRAVENOUS | Status: AC | PRN
  Administered 2014-01-17: 12:00:00 30 via INTRAVENOUS

## 2014-01-17 MED ORDER — ENOXAPARIN SODIUM 40 MG/0.4ML ~~LOC~~ SOLN
40.0000 mg | SUBCUTANEOUS | Status: DC
  Administered 2014-01-17: 40 mg via SUBCUTANEOUS
  Filled 2014-01-17: qty 0.4

## 2014-01-17 MED ORDER — MORPHINE SULFATE 2 MG/ML IJ SOLN
2.0000 mg | INTRAMUSCULAR | Status: DC | PRN
  Administered 2014-01-17 (×2): 2 mg via INTRAVENOUS
  Filled 2014-01-17 (×2): qty 1

## 2014-01-17 MED ORDER — BUDESONIDE-FORMOTEROL FUMARATE 160-4.5 MCG/ACT IN AERO
2.0000 | INHALATION_SPRAY | Freq: Three times a day (TID) | RESPIRATORY_TRACT | Status: DC
  Administered 2014-01-17 (×2): 2 via RESPIRATORY_TRACT
  Filled 2014-01-17: qty 6

## 2014-01-17 MED ORDER — REGADENOSON 0.4 MG/5ML IV SOLN
0.4000 mg | Freq: Once | INTRAVENOUS | Status: AC
  Administered 2014-01-17: 0.4 mg via INTRAVENOUS

## 2014-01-17 MED ORDER — ACETAMINOPHEN 325 MG PO TABS: 650.0000 mg | ORAL_TABLET | ORAL | Status: DC | PRN

## 2014-01-17 MED ORDER — LURASIDONE HCL 40 MG PO TABS
40.0000 mg | ORAL_TABLET | Freq: Every day | ORAL | Status: DC
  Administered 2014-01-17: 40 mg via ORAL
  Filled 2014-01-17: qty 1

## 2014-01-17 MED ORDER — FAMOTIDINE 20 MG PO TABS
20.0000 mg | ORAL_TABLET | Freq: Every day | ORAL | Status: DC
  Administered 2014-01-17: 20 mg via ORAL
  Filled 2014-01-17: qty 1

## 2014-01-17 MED ORDER — CARBAMAZEPINE ER 400 MG PO TB12
600.0000 mg | ORAL_TABLET | Freq: Every day | ORAL | Status: DC
  Administered 2014-01-17: 600 mg via ORAL
  Filled 2014-01-17: qty 1

## 2014-01-17 MED ORDER — PNEUMOCOCCAL VAC POLYVALENT 25 MCG/0.5ML IJ INJ: 0.5000 mL | INJECTION | INTRAMUSCULAR | Status: DC

## 2014-01-17 MED ORDER — PANTOPRAZOLE SODIUM 40 MG PO TBEC
40.0000 mg | DELAYED_RELEASE_TABLET | Freq: Two times a day (BID) | ORAL | Status: DC
  Administered 2014-01-17: 40 mg via ORAL
  Filled 2014-01-17: qty 1

## 2014-01-17 MED ORDER — ONDANSETRON HCL 4 MG/2ML IJ SOLN: 4.0000 mg | Freq: Four times a day (QID) | INTRAMUSCULAR | Status: DC | PRN

## 2014-01-17 NOTE — Consult Note (Signed)
HPI: 33 year old male for evaluation of chest pain. Patient had a cardiac CT in July of 2012 that showed normal coronary arteries and a calcium score of 0. Patient complains of intermittent chest pain for approximately 2 weeks. It is in the left chest area and described as a sharp pressure. There is associated tingling in his left upper and left lower extremity. It is nonexertional, related to food or positional. It can increase with inspiration. Last 45 minutes and resolves spontaneously. There is shortness of breath but no nausea or diaphoresis. He was admitted and cardiology is asked to evaluate.  Medications Prior to Admission  Medication Sig Dispense Refill  . budesonide-formoterol (SYMBICORT) 160-4.5 MCG/ACT inhaler Inhale 2 puffs into the lungs See admin instructions. Take 3 times daily (scheduled) and another 2 times daily as needed for copd      . carbamazepine (TEGRETOL XR) 200 MG 12 hr tablet Take 600 mg by mouth daily.       . hydrOXYzine (VISTARIL) 50 MG capsule Take 50 mg by mouth 3 (three) times daily.      Marland Kitchen lurasidone (LATUDA) 40 MG TABS tablet Take 40 mg by mouth daily.       . pantoprazole (PROTONIX) 40 MG tablet Take 40 mg by mouth 2 (two) times daily.      . ranitidine (ZANTAC) 150 MG tablet Take 150 mg by mouth 2 (two) times daily.        Allergies  Allergen Reactions  . Penicillins Anaphylaxis  . Flagyl [Metronidazole] Swelling  . Latex Hives  . Phenergan [Promethazine Hcl] Other (See Comments)    Seizure  . Vancomycin Swelling    Throat closed up    Past Medical History  Diagnosis Date  . COPD (chronic obstructive pulmonary disease)   . Bell's palsy   . IBS (irritable bowel syndrome)   . Hypertension   . Bipolar 1 disorder   . Schizophrenia   . Asthma     Past Surgical History  Procedure Laterality Date  . Cholecystectomy    . Abdominal surgery    . Incision and drainage Left 02/05/2013    Dr Shon Baton  . Knee arthroscopy with medial menisectomy Left  02/05/2013    Procedure: KNEE ARTHROSCOPY  ;  Surgeon: Venita Lick, MD;  Location: [facility] OR;  Service: Orthopedics;  Laterality: Left;  . Irrigation and debridement knee  02/05/2013    Procedure: IRRIGATION AND DEBRIDEMENT KNEE patella bursa;  Surgeon: Venita Lick, MD;  Location: [facility] OR;  Service: Orthopedics;;    History   Social History  . Marital Status: Single    Spouse Name: N/A    Number of Children: 2  . Years of Education: N/A   Occupational History  . Not on file.   Social History Main Topics  . Smoking status: Current Every Day Smoker -- 0.50 packs/day for 12 years    Types: Cigarettes  . Smokeless tobacco: Never Used  . Alcohol Use: Yes     Comment: once a week  . Drug Use: No  . Sexual Activity: Not on file   Other Topics Concern  . Not on file   Social History Narrative  . No narrative on file    Family History  Problem Relation Age of Onset  . Stroke Mother   . CAD Father     MI in his late 30s    ROS:  no fevers or chills, productive cough, hemoptysis, dysphasia, odynophagia, melena, hematochezia, dysuria, hematuria, rash, seizure activity,  orthopnea, PND, pedal edema, claudication. Remaining systems are negative.  Physical Exam:   Blood pressure 111/64, pulse 70, temperature 97.8 F (36.6 C), temperature source Oral, resp. rate 18, height $RemoveBe'6\' 1"'rfhWhcNax$  (1.854 m), weight 257 lb 4.8 oz (116.711 kg), SpO2 98.00%.  General:  Well developed/well nourished in NAD Skin warm/dry Patient not depressed No peripheral clubbing Back-normal HEENT-normal/normal eyelids Neck supple/normal carotid upstroke bilaterally; no bruits; no JVD; no thyromegaly chest - CTA/ normal expansion CV - RRR/normal S1 and S2; no murmurs, rubs or gallops;  PMI nondisplaced Abdomen -NT/ND, no HSM, no mass, + bowel sounds, no bruit, previous abdominal surgery 2+ femoral pulses, no bruits Ext-no edema, chords, 2+ DP Neuro-grossly nonfocal  ECG sinus rhythm with RV conduction delay and no  ST changes.  Results for orders placed during the hospital encounter of 01/16/14 (from the past 48 hour(s))  CBC     Status: None   Collection Time    01/16/14  8:41 PM      Result Value Ref Range   WBC 6.4  4.0 - 10.5 K/uL   RBC 5.19  4.22 - 5.81 MIL/uL   Hemoglobin 15.6  13.0 - 17.0 g/dL   HCT 43.7  39.0 - 52.0 %   MCV 84.2  78.0 - 100.0 fL   MCH 30.1  26.0 - 34.0 pg   MCHC 35.7  30.0 - 36.0 g/dL   RDW 12.3  11.5 - 15.5 %   Platelets 197  150 - 400 K/uL  PRO B NATRIURETIC PEPTIDE     Status: None   Collection Time    01/16/14  8:41 PM      Result Value Ref Range   Pro B Natriuretic peptide (BNP) 10.2  0 - 125 pg/mL  COMPREHENSIVE METABOLIC PANEL     Status: Abnormal   Collection Time    01/16/14  8:41 PM      Result Value Ref Range   Sodium 138  137 - 147 mEq/L   Potassium 4.2  3.7 - 5.3 mEq/L   Chloride 99  96 - 112 mEq/L   CO2 26  19 - 32 mEq/L   Glucose, Bld 99  70 - 99 mg/dL   BUN 6  6 - 23 mg/dL   Creatinine, Ser 1.10  0.50 - 1.35 mg/dL   Calcium 9.1  8.4 - 10.5 mg/dL   Total Protein 6.6  6.0 - 8.3 g/dL   Albumin 3.7  3.5 - 5.2 g/dL   AST 30  0 - 37 U/L   Comment: SLIGHT HEMOLYSIS     HEMOLYSIS AT THIS LEVEL MAY AFFECT RESULT   ALT 42  0 - 53 U/L   Alkaline Phosphatase 89  39 - 117 U/L   Total Bilirubin 0.4  0.3 - 1.2 mg/dL   GFR calc non Af Amer 89 (*) >90 mL/min   GFR calc Af Amer >90  >90 mL/min   Comment: (NOTE)     The eGFR has been calculated using the CKD EPI equation.     This calculation has not been validated in all clinical situations.     eGFR's persistently <90 mL/min signify possible Chronic Kidney     Disease.   Anion gap 13  5 - 15  I-STAT TROPOININ, ED     Status: None   Collection Time    01/16/14  8:50 PM      Result Value Ref Range   Troponin i, poc 0.00  0.00 - 0.08 ng/mL  Comment 3            Comment: Due to the release kinetics of cTnI,     a negative result within the first hours     of the onset of symptoms does not rule out      myocardial infarction with certainty.     If myocardial infarction is still suspected,     repeat the test at appropriate intervals.  D-DIMER, QUANTITATIVE     Status: None   Collection Time    01/16/14 11:56 PM      Result Value Ref Range   D-Dimer, Quant <0.27  0.00 - 0.48 ug/mL-FEU   Comment:            AT THE INHOUSE ESTABLISHED CUTOFF     VALUE OF 0.48 ug/mL FEU,     THIS ASSAY HAS BEEN DOCUMENTED     IN THE LITERATURE TO HAVE     A SENSITIVITY AND NEGATIVE     PREDICTIVE VALUE OF AT LEAST     98 TO 99%.  THE TEST RESULT     SHOULD BE CORRELATED WITH     AN ASSESSMENT OF THE CLINICAL     PROBABILITY OF DVT / VTE.  TROPONIN I     Status: None   Collection Time    01/16/14 11:56 PM      Result Value Ref Range   Troponin I <0.30  <0.30 ng/mL   Comment:            Due to the release kinetics of cTnI,     a negative result within the first hours     of the onset of symptoms does not rule out     myocardial infarction with certainty.     If myocardial infarction is still suspected,     repeat the test at appropriate intervals.  URINE RAPID DRUG SCREEN (HOSP PERFORMED)     Status: Abnormal   Collection Time    01/17/14  2:34 AM      Result Value Ref Range   Opiates POSITIVE (*) NONE DETECTED   Cocaine NONE DETECTED  NONE DETECTED   Benzodiazepines NONE DETECTED  NONE DETECTED   Amphetamines NONE DETECTED  NONE DETECTED   Tetrahydrocannabinol NONE DETECTED  NONE DETECTED   Barbiturates NONE DETECTED  NONE DETECTED   Comment:            DRUG SCREEN FOR MEDICAL PURPOSES     ONLY.  IF CONFIRMATION IS NEEDED     FOR ANY PURPOSE, NOTIFY LAB     WITHIN 5 DAYS.                LOWEST DETECTABLE LIMITS     FOR URINE DRUG SCREEN     Drug Class       Cutoff (ng/mL)     Amphetamine      1000     Barbiturate      200     Benzodiazepine   833     Tricyclics       825     Opiates          300     Cocaine          300     THC              50  TROPONIN I     Status: None    Collection Time    01/17/14  4:47 AM  Result Value Ref Range   Troponin I <0.30  <0.30 ng/mL   Comment:            Due to the release kinetics of cTnI,     a negative result within the first hours     of the onset of symptoms does not rule out     myocardial infarction with certainty.     If myocardial infarction is still suspected,     repeat the test at appropriate intervals.    Dg Chest 2 View  01/16/2014   CLINICAL DATA:  Chest pain, shortness of breath, dizziness  EXAM: CHEST  2 VIEW  COMPARISON:  06/21/2013  FINDINGS: The heart size and mediastinal contours are within normal limits. Both lungs are clear. The visualized skeletal structures are unremarkable.  IMPRESSION: No active cardiopulmonary disease.   Electronically Signed   By: Kathreen Devoid   On: 01/16/2014 21:29    Assessment/Plan 1 chest pain-symptoms are extremely atypical. Enzymes negative. D-dimer normal. Electrocardiogram with no ST changes. Previous CT showed no coronary disease and calcium score 0. We will plan a stress nuclear study today. If negative would not pursue further cardiac workup. 2 bipolar/schizophrenia-management per primary care. 3 tobacco abuse-patient counseled on discontinuing. Nuclear study negative patient can be discharged from a cardiac standpoint. He should followup with his primary care physician to  Kirk Ruths MD 01/17/2014, 9:24 AM

## 2014-01-17 NOTE — ED Notes (Signed)
Report given to floor All questions answered by this nurse Patient alert and oriented x 4 and in NAD upon leaving ED for floor

## 2014-01-17 NOTE — Progress Notes (Signed)
Pt reports pain 0/10.  B/P 11/64  HR 70. Nathaniel [name]

## 2014-01-17 NOTE — Discharge Summary (Signed)
Physician Discharge Summary  Nathaniel [name] ZOX:[id number] DOB: April 18, 1981 DOA: 01/16/2014  PCP: No PCP Per Patient  Admit date: 01/16/2014 Discharge date: 01/17/2014  Time spent:20  minutes  Recommendations for Outpatient Follow-up:  1. patient left AMA  Discharge Diagnoses:   Principle problem  chest pain, atypical  Active Problems:   Tobacco abuse   HTN (hypertension)   Chest pain   COPD (chronic obstructive pulmonary disease)   Discharge Condition: left AMA  Diet recommendation:   Filed Weights   01/17/14 0200 01/17/14 0529  Weight: 116.711 kg (257 lb 4.8 oz) 116.711 kg (257 lb 4.8 oz)    History of present illness:  33 y.o. male has a past medical history of COPD (chronic obstructive pulmonary disease); Bell's palsy; IBS (irritable bowel syndrome); Hypertension; Bipolar 1 disorder; Schizophrenia; and Asthma p/w chest pain  Intermittent over past few months. Has similar episode few months back and had come to the ED and d/ced home after negative initial w/up  given underlying COPD, active tobacco use and strong family hx admitted under observation for further w/up   Hospital Course:  Chest pain  Serial troponin negative. EKG unremarkable No further chest pain  seen by cardiology and pt underwent stress test today. 2 d echo showed normal EF with no wall motion abnormality. Coronary CT for chest pain in 2012 was normal. Stress test result pending. Patient wanted to leave the hospital as he couldn't smoke. He had filled out AMA form before i could talk to him or examined. Him . i tried to convince him to stay a little while until his stress test was back but he refused and left against medical advice. He was not interested in participating in any further conversation.  Stress test result will be followed. If abnormal , i will contact the patient.   Procedures:  2d echo   nuclear stress test  Consultations:  cardiology  Discharge Exam: Filed Vitals:   01/17/14  1359  BP: 136/86  Pulse: 83  Temp: 97.5 F (36.4 C)  Resp: 18    Physical Exam not done  as patient left AMA before examined   Discharge Instructions You were cared for by a hospitalist during your hospital stay. If you have any questions about your discharge medications or the care you received while you were in the hospital after you are discharged, you can call the unit and asked to speak with the hospitalist on call if the hospitalist that took care of you is not available. Once you are discharged, your primary care physician will handle any further medical issues. Please note that NO REFILLS for any discharge medications will be authorized once you are discharged, as it is imperative that you return to your primary care physician (or establish a relationship with a primary care physician if you do not have one) for your aftercare needs so that they can reassess your need for medications and monitor your lab values.     Medication List         budesonide-formoterol 160-4.5 MCG/ACT inhaler  Commonly known as:  SYMBICORT  Inhale 2 puffs into the lungs See admin instructions. Take 3 times daily (scheduled) and another 2 times daily as needed for copd     carbamazepine 200 MG 12 hr tablet  Commonly known as:  TEGRETOL XR  Take 600 mg by mouth daily.     hydrOXYzine 50 MG capsule  Commonly known as:  VISTARIL  Take 50 mg by mouth 3 (three) times daily.  lurasidone 40 MG Tabs tablet  Commonly known as:  LATUDA  Take 40 mg by mouth daily.     pantoprazole 40 MG tablet  Commonly known as:  PROTONIX  Take 40 mg by mouth 2 (two) times daily.     ranitidine 150 MG tablet  Commonly known as:  ZANTAC  Take 150 mg by mouth 2 (two) times daily.       Allergies  Allergen Reactions  . Penicillins Anaphylaxis  . Flagyl [Metronidazole] Swelling  . Latex Hives  . Phenergan [Promethazine Hcl] Other (See Comments)    Seizure  . Vancomycin Swelling    Throat closed up        Follow-up Information   Follow up with No PCP Per Patient.   Specialty:  General Practice       The results of significant diagnostics from this hospitalization (including imaging, microbiology, ancillary and laboratory) are listed below for reference.    Significant Diagnostic Studies: Dg Chest 2 View  01/16/2014   CLINICAL DATA:  Chest pain, shortness of breath, dizziness  EXAM: CHEST  2 VIEW  COMPARISON:  06/21/2013  FINDINGS: The heart size and mediastinal contours are within normal limits. Both lungs are clear. The visualized skeletal structures are unremarkable.  IMPRESSION: No active cardiopulmonary disease.   Electronically Signed   By: Elige Ko   On: 01/16/2014 21:29    Microbiology: No results found for this or any previous visit (from the past 240 hour(s)).   Labs: Basic Metabolic Panel:  Recent Labs Lab 01/16/14 2041  NA 138  K 4.2  CL 99  CO2 26  GLUCOSE 99  BUN 6  CREATININE 1.10  CALCIUM 9.1   Liver Function Tests:  Recent Labs Lab 01/16/14 2041  AST 30  ALT 42  ALKPHOS 89  BILITOT 0.4  PROT 6.6  ALBUMIN 3.7   No results found for this basename: LIPASE, AMYLASE,  in the last 168 hours No results found for this basename: AMMONIA,  in the last 168 hours CBC:  Recent Labs Lab 01/16/14 2041  WBC 6.4  HGB 15.6  HCT 43.7  MCV 84.2  PLT 197   Cardiac Enzymes:  Recent Labs Lab 01/16/14 2356 01/17/14 0447 01/17/14 1245  TROPONINI <0.30 <0.30 <0.30   BNP: BNP (last 3 results)  Recent Labs  10/06/13 2010 01/16/14 2041  PROBNP 5.3 10.2   CBG: No results found for this basename: GLUCAP,  in the last 168 hours     Signed:  Leanard Dimaio  [facility] 01/17/2014, 3:19 PM

## 2014-01-17 NOTE — ED Notes (Signed)
Patient denies c/o pain at this time Dr. Roel Cluck at bedside

## 2014-01-17 NOTE — Progress Notes (Signed)
Pt stating he wants to leave.  Told pt RN would call MD to see if results were back.  Results were not back and MD stated to tell pt they would probably be back within the hour.  Informed patient of this and he started taking his telemetry off and said he was going to take his IV out.  MD on floor and tried to get the pt to stay as well.  Pt refused and left AMA. Andre Lefort

## 2014-01-17 NOTE — Progress Notes (Signed)
Pt complains of squeezing left sided chest pain 6/10.  Non-radiating.  118/75 and pulse 57.  Morphine and Nitroglycerin given. Andre Lefort

## 2014-01-17 NOTE — Progress Notes (Signed)
Echo Lab  2D Echocardiogram completed.  [place], RDCS 01/17/2014 8:44 AM

## 2014-01-18 NOTE — Progress Notes (Signed)
Stress test results reviewed. Reported No evidence of ischemia. Normal LV wall motion. EF of 72%.

## 2014-02-13 ENCOUNTER — Emergency Department (HOSPITAL_COMMUNITY)
Admission: EM | Admit: 2014-02-13 | Discharge: 2014-02-14 | Disposition: A | Discharge status: Home / Self Care | Payer: Self-pay | Attending: Emergency Medicine | Admitting: Emergency Medicine

## 2014-02-13 ENCOUNTER — Encounter (HOSPITAL_COMMUNITY): Payer: Self-pay | Admitting: Emergency Medicine

## 2014-02-13 DIAGNOSIS — Z880 Allergy status to penicillin: Secondary | ICD-10-CM | POA: Insufficient documentation

## 2014-02-13 DIAGNOSIS — J4489 Other specified chronic obstructive pulmonary disease: Secondary | ICD-10-CM | POA: Insufficient documentation

## 2014-02-13 DIAGNOSIS — F17200 Nicotine dependence, unspecified, uncomplicated: Secondary | ICD-10-CM | POA: Insufficient documentation

## 2014-02-13 DIAGNOSIS — IMO0002 Reserved for concepts with insufficient information to code with codable children: Secondary | ICD-10-CM | POA: Insufficient documentation

## 2014-02-13 DIAGNOSIS — Z79899 Other long term (current) drug therapy: Secondary | ICD-10-CM | POA: Insufficient documentation

## 2014-02-13 DIAGNOSIS — F209 Schizophrenia, unspecified: Secondary | ICD-10-CM | POA: Insufficient documentation

## 2014-02-13 DIAGNOSIS — J449 Chronic obstructive pulmonary disease, unspecified: Secondary | ICD-10-CM | POA: Insufficient documentation

## 2014-02-13 DIAGNOSIS — Z8719 Personal history of other diseases of the digestive system: Secondary | ICD-10-CM | POA: Insufficient documentation

## 2014-02-13 DIAGNOSIS — Z91040 Latex allergy status: Secondary | ICD-10-CM | POA: Insufficient documentation

## 2014-02-13 DIAGNOSIS — I10 Essential (primary) hypertension: Secondary | ICD-10-CM | POA: Insufficient documentation

## 2014-02-13 DIAGNOSIS — Z8669 Personal history of other diseases of the nervous system and sense organs: Secondary | ICD-10-CM | POA: Insufficient documentation

## 2014-02-13 DIAGNOSIS — F319 Bipolar disorder, unspecified: Secondary | ICD-10-CM | POA: Insufficient documentation

## 2014-02-13 DIAGNOSIS — R079 Chest pain, unspecified: Secondary | ICD-10-CM | POA: Insufficient documentation

## 2014-02-13 LAB — CBC
HCT: 43.8 % (ref 39.0–52.0)
HEMOGLOBIN: 15.7 g/dL (ref 13.0–17.0)
MCH: 30.4 pg (ref 26.0–34.0)
MCHC: 35.8 g/dL (ref 30.0–36.0)
MCV: 84.7 fL (ref 78.0–100.0)
Platelets: 215 10*3/uL (ref 150–400)
RBC: 5.17 MIL/uL (ref 4.22–5.81)
RDW: 12.2 % (ref 11.5–15.5)
WBC: 6.6 10*3/uL (ref 4.0–10.5)

## 2014-02-13 LAB — I-STAT TROPONIN, ED: Troponin i, poc: 0 ng/mL (ref 0.00–0.08)

## 2014-02-13 LAB — BASIC METABOLIC PANEL
Anion gap: 14 (ref 5–15)
BUN: 5 mg/dL — ABNORMAL LOW (ref 6–23)
CALCIUM: 8.7 mg/dL (ref 8.4–10.5)
CO2: 22 meq/L (ref 19–32)
CREATININE: 0.94 mg/dL (ref 0.50–1.35)
Chloride: 102 mEq/L (ref 96–112)
GFR calc Af Amer: >90 mL/min (ref >90)
GLUCOSE: 136 mg/dL — AB (ref 70–99)
Potassium: 3.1 mEq/L — ABNORMAL LOW (ref 3.7–5.3)
SODIUM: 138 meq/L (ref 137–147)

## 2014-02-13 MED ORDER — HYDROMORPHONE HCL PF 1 MG/ML IJ SOLN
1.0000 mg | Freq: Once | INTRAMUSCULAR | Status: AC
  Administered 2014-02-14: 1 mg via INTRAVENOUS
  Filled 2014-02-13: qty 1

## 2014-02-13 MED ORDER — KETOROLAC TROMETHAMINE 30 MG/ML IJ SOLN
30.0000 mg | Freq: Once | INTRAMUSCULAR | Status: AC
  Administered 2014-02-14: 30 mg via INTRAVENOUS
  Filled 2014-02-13: qty 1

## 2014-02-13 MED ORDER — SODIUM CHLORIDE 0.9 % IV BOLUS (SEPSIS)
1000.0000 mL | Freq: Once | INTRAVENOUS | Status: AC
  Administered 2014-02-14: 1000 mL via INTRAVENOUS

## 2014-02-13 MED ORDER — METHYLPREDNISOLONE SODIUM SUCC 125 MG IJ SOLR
125.0000 mg | INTRAMUSCULAR | Status: AC
Start: 2014-02-14 — End: 2014-02-14
  Administered 2014-02-14: 125 mg via INTRAVENOUS
  Filled 2014-02-13: qty 2

## 2014-02-13 MED ORDER — ASPIRIN 81 MG PO CHEW
324.0000 mg | CHEWABLE_TABLET | Freq: Once | ORAL | Status: AC
  Administered 2014-02-13: 324 mg via ORAL
  Filled 2014-02-13: qty 4

## 2014-02-13 NOTE — ED Notes (Signed)
Patient here with complaint of chest pain which began last night. Explains that pain is constantly about 7/10. Pain spikes to 10/10, simultaneously he experiences nausea, diaphoresis, left arm pain, back pain, weakness, lightheadedness, and shortness of breath. Previous admission for chest pain at  [facility] earlier this month.

## 2014-02-14 ENCOUNTER — Encounter (HOSPITAL_COMMUNITY): Payer: Self-pay | Admitting: Emergency Medicine

## 2014-02-14 ENCOUNTER — Emergency Department (HOSPITAL_COMMUNITY): Payer: BC Managed Care – PPO

## 2014-02-14 LAB — TROPONIN I: Troponin I: <0.3 ng/mL (ref <0.30)

## 2014-02-14 MED ORDER — HYDROMORPHONE HCL PF 1 MG/ML IJ SOLN
1.0000 mg | Freq: Once | INTRAMUSCULAR | Status: AC
  Administered 2014-02-14: 1 mg via INTRAVENOUS
  Filled 2014-02-14: qty 1

## 2014-02-14 MED ORDER — LORAZEPAM 2 MG/ML IJ SOLN
1.0000 mg | Freq: Once | INTRAMUSCULAR | Status: AC
  Administered 2014-02-14: 1 mg via INTRAVENOUS
  Filled 2014-02-14: qty 1

## 2014-02-14 MED ORDER — OXYCODONE-ACETAMINOPHEN 5-325 MG PO TABS: 2.0000 | ORAL_TABLET | Freq: Four times a day (QID) | ORAL | Status: DC | PRN

## 2014-02-14 MED ORDER — IBUPROFEN 800 MG PO TABS: 800.0000 mg | ORAL_TABLET | Freq: Three times a day (TID) | ORAL | Status: AC

## 2014-02-14 NOTE — Discharge Instructions (Signed)
As discussed, your evaluation today has been largely reassuring.  But, it is important that you monitor your condition carefully, and do not hesitate to return to the ED if you develop new, or concerning changes in your condition. ° °Otherwise, please follow-up with your physician for appropriate ongoing care. ° °Chest Pain (Nonspecific) °It is often hard to give a specific diagnosis for the cause of chest pain. There is always a chance that your pain could be related to something serious, such as a heart attack or a blood clot in the lungs. You need to follow up with your health care provider for further evaluation. °CAUSES  °· Heartburn. °· Pneumonia or bronchitis. °· Anxiety or stress. °· Inflammation around your heart (pericarditis) or lung (pleuritis or pleurisy). °· A blood clot in the lung. °· A collapsed lung (pneumothorax). It can develop suddenly on its own (spontaneous pneumothorax) or from trauma to the chest. °· Shingles infection (herpes zoster virus). °The chest wall is composed of bones, muscles, and cartilage. Any of these can be the source of the pain. °· The bones can be bruised by injury. °· The muscles or cartilage can be strained by coughing or overwork. °· The cartilage can be affected by inflammation and become sore (costochondritis). °DIAGNOSIS  °Lab tests or other studies may be needed to find the cause of your pain. Your health care provider may have you take a test called an ambulatory electrocardiogram (ECG). An ECG records your heartbeat patterns over a 24-hour period. You may also have other tests, such as: °· Transthoracic echocardiogram (TTE). During echocardiography, sound waves are used to evaluate how blood flows through your heart. °· Transesophageal echocardiogram (TEE). °· Cardiac monitoring. This allows your health care provider to monitor your heart rate and rhythm in real time. °· Holter monitor. This is a portable device that records your heartbeat and can help diagnose  heart arrhythmias. It allows your health care provider to track your heart activity for several days, if needed. °· Stress tests by exercise or by giving medicine that makes the heart beat faster. °TREATMENT  °· Treatment depends on what may be causing your chest pain. Treatment may include: °¨ Acid blockers for heartburn. °¨ Anti-inflammatory medicine. °¨ Pain medicine for inflammatory conditions. °¨ Antibiotics if an infection is present. °· You may be advised to change lifestyle habits. This includes stopping smoking and avoiding alcohol, caffeine, and chocolate. °· You may be advised to keep your head raised (elevated) when sleeping. This reduces the chance of acid going backward from your stomach into your esophagus. °Most of the time, nonspecific chest pain will improve within 2-3 days with rest and mild pain medicine.  °HOME CARE INSTRUCTIONS  °· If antibiotics were prescribed, take them as directed. Finish them even if you start to feel better. °· For the next few days, avoid physical activities that bring on chest pain. Continue physical activities as directed. °· Do not use any tobacco products, including cigarettes, chewing tobacco, or electronic cigarettes. °· Avoid drinking alcohol. °· Only take medicine as directed by your health care provider. °· Follow your health care provider's suggestions for further testing if your chest pain does not go away. °· Keep any follow-up appointments you made. If you do not go to an appointment, you could develop lasting (chronic) problems with pain. If there is any problem keeping an appointment, call to reschedule. °SEEK MEDICAL CARE IF:  °· Your chest pain does not go away, even after treatment. °· You have   a rash with blisters on your chest. °· You have a fever. °SEEK IMMEDIATE MEDICAL CARE IF:  °· You have increased chest pain or pain that spreads to your arm, neck, jaw, back, or abdomen. °· You have shortness of breath. °· You have an increasing cough, or you  cough up blood. °· You have severe back or abdominal pain. °· You feel nauseous or vomit. °· You have severe weakness. °· You faint. °· You have chills. °This is an emergency. Do not wait to see if the pain will go away. Get medical help at once. Call your local emergency services (911 in [place] Do not drive yourself to the hospital. °MAKE SURE YOU:  °· Understand these instructions. °· Will watch your condition. °· Will get help right away if you are not doing well or get worse. °Document Released: 04/13/2005 Document Revised: 07/09/2013 Document Reviewed: 02/07/2008 °[employer]® Patient Information ©2015 [employer]. This information is not intended to replace advice given to you by your health care provider. Make sure you discuss any questions you have with your health care provider. ° °

## 2014-02-14 NOTE — ED Provider Notes (Addendum)
CSN: [id number]     Arrival date & time 02/13/14  2145 History   First MD Initiated Contact with Patient 02/13/14 2350     Chief Complaint  Patient presents with  . Chest Pain     (Consider location/radiation/quality/duration/timing/severity/associated sxs/prior Treatment) HPI Patient presents with ongoing left-sided chest pain.  Today's episode began approximately 24 hours ago.  Since that time it has been intermittent, with multiple episodes of left-sided chest pain with radiation down left arm.  There is associated dyspnea, but no pleuritic pain  Past Medical History  Diagnosis Date  . COPD (chronic obstructive pulmonary disease)   . Bell's palsy   . IBS (irritable bowel syndrome)   . Hypertension   . Bipolar 1 disorder   . Schizophrenia   . Asthma    Past Surgical History  Procedure Laterality Date  . Cholecystectomy    . Abdominal surgery    . Incision and drainage Left 02/05/2013    Dr Rolena Infante  . Knee arthroscopy with medial menisectomy Left 02/05/2013    Procedure: KNEE ARTHROSCOPY  ;  Surgeon: Melina Schools, MD;  Location: [facility];  Service: Orthopedics;  Laterality: Left;  . Irrigation and debridement knee  02/05/2013    Procedure: IRRIGATION AND DEBRIDEMENT KNEE patella bursa;  Surgeon: Melina Schools, MD;  Location: [facility];  Service: Orthopedics;;   Family History  Problem Relation Age of Onset  . Stroke Mother   . CAD Father     MI in his late 63s   History  Substance Use Topics  . Smoking status: Current Every Day Smoker -- 0.50 packs/day for 12 years    Types: Cigarettes  . Smokeless tobacco: Never Used  . Alcohol Use: No    Review of Systems  Constitutional:       Per HPI, otherwise negative  HENT:       Per HPI, otherwise negative  Respiratory:       Per HPI, otherwise negative  Cardiovascular:       Per HPI, otherwise negative  Gastrointestinal: Negative for vomiting.  Endocrine:       Negative aside from HPI  Genitourinary:       Neg aside from  HPI   Musculoskeletal:       Per HPI, otherwise negative  Skin: Negative.   Neurological: Positive for facial asymmetry. Negative for syncope, weakness and headaches.      Allergies  Penicillins; Flagyl; Latex; Phenergan; and Vancomycin  Home Medications   Prior to Admission medications   Medication Sig Start Date End Date Taking? Authorizing Provider  budesonide-formoterol (SYMBICORT) 160-4.5 MCG/ACT inhaler Inhale 2 puffs into the lungs See admin instructions. Take 3 times daily (scheduled) and another 2 times daily as needed for copd   Yes Historical Provider, MD  carbamazepine (TEGRETOL XR) 200 MG 12 hr tablet Take 600 mg by mouth daily.    Yes Historical Provider, MD  hydrOXYzine (VISTARIL) 50 MG capsule Take 50 mg by mouth 3 (three) times daily.   Yes Historical Provider, MD  lurasidone (LATUDA) 40 MG TABS tablet Take 40 mg by mouth daily.    Yes Historical Provider, MD  pantoprazole (PROTONIX) 40 MG tablet Take 40 mg by mouth 2 (two) times daily.   Yes Historical Provider, MD  ranitidine (ZANTAC) 150 MG tablet Take 150 mg by mouth 2 (two) times daily.   Yes Historical Provider, MD   BP 127/89  Pulse 75  Temp(Src) 97.9 F (36.6 C) (Oral)  Resp 22  Ht  6\' 1"  (1.854 m)  Wt 255 lb (115.667 kg)  BMI 33.65 kg/m2  SpO2 97% Physical Exam  Nursing note and vitals reviewed. Constitutional: He is oriented to person, place, and time. He appears well-developed. No distress.  HENT:  Head: Normocephalic and atraumatic.  Eyes: Conjunctivae and EOM are normal.  Cardiovascular: Normal rate and regular rhythm.   Pulmonary/Chest: Effort normal. No stridor. No respiratory distress.  Abdominal: He exhibits no distension.  Musculoskeletal: He exhibits no edema.  Neurological: He is alert and oriented to person, place, and time. He displays no atrophy and no tremor. A cranial nerve deficit is present. No sensory deficit. He exhibits normal muscle tone. He displays no seizure activity.  L  facial dystonia, baseline per patient.  Skin: Skin is warm and dry.  Psychiatric: He has a normal mood and affect.    ED Course  Procedures (including critical care time) Labs Review Labs Reviewed  BASIC METABOLIC PANEL - Abnormal; Notable for the following:    Potassium 3.1 (*)    Glucose, Bld 136 (*)    BUN 5 (*)    All other components within normal limits  CBC  TROPONIN I  Randolm Idol, ED    Imaging Review Dg Chest 2 View  02/14/2014   CLINICAL DATA:  Left-sided chest pain. Left arm and shoulder pain for 1 day.  EXAM: CHEST  2 VIEW  COMPARISON:  01/16/2014  FINDINGS: The heart size and mediastinal contours are within normal limits. Both lungs are clear. The visualized skeletal structures are unremarkable.  IMPRESSION: No active cardiopulmonary disease.   Electronically Signed   By: Lucienne Capers M.D.   On: 02/14/2014 00:52     EKG Interpretation   Date/Time:  Thursday February 13 2014 21:53:50 EDT Ventricular Rate:  105 PR Interval:  134 QRS Duration: 94 QT Interval:  332 QTC Calculation: 438 R Axis:   -17 Text Interpretation:  Sinus tachycardia Incomplete right bundle branch  block Cannot rule out Anterior infarct , age undetermined Abnormal ECG  Sinus tachycardia Non-specific intra-ventricular conduction delay No  significant change since last tracing Abnormal ekg Confirmed by Carmin Muskrat  MD [phone]) on 02/14/2014 12:00:16 AM     5:32 AM Patient sleeping on repeat exam.  I had a lengthy conversation with him and his family member about all results, prior evaluations, including CT of the chest, stress tests, x-rays, and states reassuring evaluation, including serial troponins are normal, x-ray that was normal, labs that were normal. We discussed the need for outpatient followup with primary care physician given his recurrent pain, in spite of reassuring emergency department evaluations, and the suggestion of likely nonacute source for pain.  MDM   Patient  presents with recurrent chest pain.  Patient is in no distress, though he appears uncomfortable.  Patient's evaluation including stroke components, EKG, labs his reassuring.  Patient was sleeping on repeat exam.  Given the patient's episodic chest pain, he was discharged to follow up with primary care for further evaluation, management.  As the etiology is unclear, with tinnitus, and multiple prior recent evaluations, there is low suspicion for ongoing coronary ischemia. Similarly, there is low suspicion for infection given the absence of fever, leukocytosis. Patient has no evidence of pneumothorax, nor tension pneumothorax.    Carmin Muskrat, MD 02/14/14 [place], MD 03/03/14 [phone]

## 2014-03-12 ENCOUNTER — Emergency Department (HOSPITAL_COMMUNITY): Payer: Self-pay

## 2014-03-12 ENCOUNTER — Emergency Department (HOSPITAL_COMMUNITY)
Admission: EM | Admit: 2014-03-12 | Discharge: 2014-03-12 | Disposition: A | Discharge status: Home / Self Care | Payer: Self-pay | Attending: Emergency Medicine | Admitting: Emergency Medicine

## 2014-03-12 ENCOUNTER — Encounter (HOSPITAL_COMMUNITY): Payer: Self-pay | Admitting: Emergency Medicine

## 2014-03-12 DIAGNOSIS — R072 Precordial pain: Secondary | ICD-10-CM | POA: Diagnosis present

## 2014-03-12 DIAGNOSIS — F17200 Nicotine dependence, unspecified, uncomplicated: Secondary | ICD-10-CM | POA: Insufficient documentation

## 2014-03-12 DIAGNOSIS — Z880 Allergy status to penicillin: Secondary | ICD-10-CM | POA: Insufficient documentation

## 2014-03-12 DIAGNOSIS — Z8719 Personal history of other diseases of the digestive system: Secondary | ICD-10-CM | POA: Insufficient documentation

## 2014-03-12 DIAGNOSIS — R079 Chest pain, unspecified: Secondary | ICD-10-CM | POA: Insufficient documentation

## 2014-03-12 DIAGNOSIS — R0789 Other chest pain: Secondary | ICD-10-CM | POA: Insufficient documentation

## 2014-03-12 DIAGNOSIS — Z79899 Other long term (current) drug therapy: Secondary | ICD-10-CM | POA: Insufficient documentation

## 2014-03-12 DIAGNOSIS — J449 Chronic obstructive pulmonary disease, unspecified: Secondary | ICD-10-CM | POA: Insufficient documentation

## 2014-03-12 DIAGNOSIS — J4489 Other specified chronic obstructive pulmonary disease: Secondary | ICD-10-CM | POA: Insufficient documentation

## 2014-03-12 DIAGNOSIS — I10 Essential (primary) hypertension: Secondary | ICD-10-CM | POA: Insufficient documentation

## 2014-03-12 DIAGNOSIS — I319 Disease of pericardium, unspecified: Secondary | ICD-10-CM

## 2014-03-12 DIAGNOSIS — Z91040 Latex allergy status: Secondary | ICD-10-CM | POA: Insufficient documentation

## 2014-03-12 LAB — BASIC METABOLIC PANEL
Anion gap: 13 (ref 5–15)
BUN: 8 mg/dL (ref 6–23)
CALCIUM: 9.1 mg/dL (ref 8.4–10.5)
CO2: 24 meq/L (ref 19–32)
CREATININE: 0.98 mg/dL (ref 0.50–1.35)
Chloride: 98 mEq/L (ref 96–112)
Glucose, Bld: 120 mg/dL — ABNORMAL HIGH (ref 70–99)
Potassium: 3.7 mEq/L (ref 3.7–5.3)
SODIUM: 135 meq/L — AB (ref 137–147)

## 2014-03-12 LAB — D-DIMER, QUANTITATIVE: D-Dimer, Quant: <0.27 ug/mL-FEU (ref 0.00–0.48)

## 2014-03-12 LAB — CBC
HCT: 45.5 % (ref 39.0–52.0)
Hemoglobin: 16.4 g/dL (ref 13.0–17.0)
MCH: 30.4 pg (ref 26.0–34.0)
MCHC: 36 g/dL (ref 30.0–36.0)
MCV: 84.3 fL (ref 78.0–100.0)
PLATELETS: 232 10*3/uL (ref 150–400)
RBC: 5.4 MIL/uL (ref 4.22–5.81)
RDW: 12 % (ref 11.5–15.5)
WBC: 5 10*3/uL (ref 4.0–10.5)

## 2014-03-12 LAB — SEDIMENTATION RATE: Sed Rate: 3 mm/hr (ref 0–16)

## 2014-03-12 LAB — I-STAT TROPONIN, ED: Troponin i, poc: 0 ng/mL (ref 0.00–0.08)

## 2014-03-12 LAB — PRO B NATRIURETIC PEPTIDE: PRO B NATRI PEPTIDE: 29.5 pg/mL (ref 0–125)

## 2014-03-12 MED ORDER — NITROGLYCERIN 0.4 MG SL SUBL
0.4000 mg | SUBLINGUAL_TABLET | SUBLINGUAL | Status: DC | PRN
  Administered 2014-03-12: 0.4 mg via SUBLINGUAL
  Filled 2014-03-12: qty 1

## 2014-03-12 MED ORDER — ONDANSETRON 8 MG PO TBDP
8.0000 mg | ORAL_TABLET | Freq: Once | ORAL | Status: DC
  Filled 2014-03-12: qty 1

## 2014-03-12 MED ORDER — MORPHINE SULFATE 4 MG/ML IJ SOLN
6.0000 mg | Freq: Once | INTRAMUSCULAR | Status: AC
Start: 2014-03-12 — End: 2014-03-12
  Administered 2014-03-12: 6 mg via INTRAMUSCULAR
  Filled 2014-03-12: qty 2

## 2014-03-12 MED ORDER — ONDANSETRON 4 MG PO TBDP
4.0000 mg | ORAL_TABLET | Freq: Once | ORAL | Status: AC
  Administered 2014-03-12: 4 mg via ORAL
  Filled 2014-03-12: qty 1

## 2014-03-12 MED ORDER — HYDROCODONE-ACETAMINOPHEN 5-325 MG PO TABS
2.0000 | ORAL_TABLET | Freq: Once | ORAL | Status: AC
  Administered 2014-03-12: 2 via ORAL
  Filled 2014-03-12: qty 2

## 2014-03-12 MED ORDER — IBUPROFEN 800 MG PO TABS: 800.0000 mg | ORAL_TABLET | Freq: Three times a day (TID) | ORAL | Status: DC | PRN

## 2014-03-12 MED ORDER — HYDROCODONE-ACETAMINOPHEN 5-325 MG PO TABS
1.0000 | ORAL_TABLET | ORAL | Status: DC | PRN
Start: 2014-03-12 — End: 2014-06-04

## 2014-03-12 NOTE — ED Provider Notes (Signed)
CSN: [id number]     Arrival date & time 03/12/14  1258 History   First MD Initiated Contact with Patient 03/12/14 1349     Chief Complaint  Patient presents with  . Chest Pain     (Consider location/radiation/quality/duration/timing/severity/associated sxs/prior Treatment) HPI Comments: Patient here complaining of worsening intermittent chest pain x4 weeks. According to him, he was admitted to the hospital recently for this and had a negative stress test. Those results I haven't been able to pull up in the EMR because of a technical issue. States that he does have a family history of premature coronary artery disease with a grandfather requiring CABG in his late 47s. His chest pain starts in the left his chest radiates down to his arm and is associated with dyspnea diaphoresis. Nothing makes it better or worse. It can last from minutes to hours. Denies any pleuritic component to this. Symptoms have been progressively worse over the past 2-3 days. Denies any orthopnea. But has dyspnea on exertion  Patient is a 33 y.o. male presenting with chest pain. The history is provided by the patient.  Chest Pain   Past Medical History  Diagnosis Date  . Hypertension   . COPD (chronic obstructive pulmonary disease)   . IBS (irritable bowel syndrome)    History reviewed. No pertinent past surgical history. History reviewed. No pertinent family history. History  Substance Use Topics  . Smoking status: Current Every Day Smoker -- 0.50 packs/day    Types: Cigarettes  . Smokeless tobacco: Not on file  . Alcohol Use: No    Review of Systems  Cardiovascular: Positive for chest pain.  All other systems reviewed and are negative.     Allergies  Flagyl; Penicillins; Phenergan; Vancomycin; and Latex  Home Medications   Prior to Admission medications   Medication Sig Start Date End Date Taking? Authorizing Provider  busPIRone (BUSPAR) 10 MG tablet Take 5-10 mg by mouth 3 (three) times daily.    Yes Historical Provider, MD  carbamazepine (TEGRETOL XR) 200 MG 12 hr tablet Take 400 mg by mouth 2 (two) times daily.   Yes Historical Provider, MD  lurasidone (LATUDA) 40 MG TABS tablet Take 40 mg by mouth every evening.   Yes Historical Provider, MD  venlafaxine (EFFEXOR) 75 MG tablet Take 75 mg by mouth 3 (three) times daily.   Yes Historical Provider, MD   BP 118/91  Pulse 88  Temp(Src) 97.2 F (36.2 C) (Oral)  Resp 14  SpO2 100% Physical Exam  Nursing note and vitals reviewed. Constitutional: He is oriented to person, place, and time. He appears well-developed and well-nourished.  Non-toxic appearance. No distress.  HENT:  Head: Normocephalic and atraumatic.  Eyes: Conjunctivae, EOM and lids are normal. Pupils are equal, round, and reactive to light.  Neck: Normal range of motion. Neck supple. No tracheal deviation present. No mass present.  Cardiovascular: Normal rate, regular rhythm and normal heart sounds.  Exam reveals no gallop.   No murmur heard. Pulmonary/Chest: Effort normal and breath sounds normal. No stridor. No respiratory distress. He has no decreased breath sounds. He has no wheezes. He has no rhonchi. He has no rales.  Abdominal: Soft. Normal appearance and bowel sounds are normal. He exhibits no distension. There is no tenderness. There is no rebound and no CVA tenderness.  Musculoskeletal: Normal range of motion. He exhibits no edema and no tenderness.  Neurological: He is alert and oriented to person, place, and time. He has normal strength. No cranial nerve  deficit or sensory deficit. GCS eye subscore is 4. GCS verbal subscore is 5. GCS motor subscore is 6.  Skin: Skin is warm and dry. No abrasion and no rash noted.  Psychiatric: He has a normal mood and affect. His speech is normal and behavior is normal.    ED Course  Procedures (including critical care time) Labs Review Labs Reviewed  BASIC METABOLIC PANEL - Abnormal; Notable for the following:    Sodium  135 (*)    Glucose, Bld 120 (*)    All other components within normal limits  CBC  PRO B NATRIURETIC PEPTIDE  I-STAT TROPOININ, ED    Imaging Review Dg Chest Port 1 View  03/12/2014   CLINICAL DATA:  Left-sided chest pain, pressure and shortness of breath.  EXAM: PORTABLE CHEST - 1 VIEW  COMPARISON:  None.  FINDINGS: Trachea is midline. Heart size normal. Lungs are clear. No pleural fluid.  IMPRESSION: No acute findings.   Electronically Signed   By: Lorin Picket M.D.   On: 03/12/2014 13:33     EKG Interpretation   Date/Time:  Wednesday March 12 2014 13:03:57 EDT Ventricular Rate:  82 PR Interval:  139 QRS Duration: 101 QT Interval:  368 QTC Calculation: 430 R Axis:   0 Text Interpretation:  Sinus rhythm RSR' in V1 or V2, right VCD or RVH  Confirmed by Iva Posten  MD, Camdan Burdi ([id number]) on 03/12/2014 2:22:55 PM      MDM   Final diagnoses:  None    Pt has significant cardiac history, will have cardiology see patient    Leota Jacobsen, MD 03/12/14 1457

## 2014-03-12 NOTE — ED Notes (Signed)
Cardiology and hospitalist at bedside.

## 2014-03-12 NOTE — ED Provider Notes (Addendum)
9:00 PM  Assumed care from Dr. Zenia Resides.  Patient is a 33 year old male with history of hypertension and family history of premature CAD, tobacco use who presented to the emergency department chest pain, shortness of breath and diaphoresis. Pain is been constant since yesterday. Troponin negative. D-dimer negative. Cardiology has seen the patient and feels that this is likely chest wall pain. Given pain is been constant since yesterday they did not feel patient needed a repeat troponin. Discussed with Dr. Aundra Dubin as cardiology who has reviewed the echocardiogram and states it is normal. They're not concerned for any life-threatening illness. They feel he can be safely discharged. They do not feel he needs to be followed by cardiology. We'll give outpatient resources for PCP. We'll discharge with prescription for Vicodin and ibuprofen. Discussed return precautions and supportive care instructions.  [place], DO 03/12/14 2124  [place], DO 03/12/14 2126

## 2014-03-12 NOTE — Consult Note (Signed)
I seen and evaluated the patient this evening along with Rosaria Ferries, PA-C.  I agree with her findings, examination recommendations.  33 year old man with no prior history of coronary disease but a grandfather with premature disease who we found that has had a Myoview done in July that was negative. Cardiac CT in 2004 which did not show any evidence of coronary calcification. He's been having about 6 days of left-sided chest discomfort that radiates to his back and arm. It has not responded to intermittent ibuprofen. The pain is consistent. Not positional. It does radiate to his left arm and back.  It is worse with deep inspiration but but no particular movements. On exam he is reproducible pain in the left parasternal region. I do not hear a rub.  With negative cardiac markers, normal EKG and negative evaluation in the recent past, a low index of suspicion this is coronary disease related pain.  His abdomen is precordial chest pain but very unlikely/minimal risk for cardiac etiology. Agree with rule out PE although this is unlikely. Checking a sedimentation rate and ANA for signs of inflammation is also reasonable.  An echocardiogram is to be performed to evaluate for signs of pericarditis although I think this is very unlikely based on his exam and presentation. If the echo proves the normal and also rules out coronary ischemia.  In this case of any further cardiac evaluation is required.  My partner will be on the lookout for the echocardiogram.   Leonie Man, M.D., M.S. Interventional Cardiologist   Pager # [phone] 03/12/2014

## 2014-03-12 NOTE — Consult Note (Signed)
CARDIOLOGY CONSULT NOTE   Patient ID: MALOSI HEMSTREET MRN: [id number] DOB/AGE: 23-Jul-1980 33 y.o.  Admit date: 03/12/2014  Primary Physician   No PCP Per Patient Primary Cardiologist   Dr. Stanford Breed Reason for Consultation Chest pain  LOV:FIEPP C Blando is a 33 y.o. male with no history of CAD.  He was seen (under MRN: [id number]) in July and had a [employer], results below. He also had a cardiac CT in 2012 that showed no CAD and a calcium score of 0.  6 days ago, he had onset of left chest pain. It was initially a 5/10, not responsive to ibuprofen. It was continuous since then. It is worse with deep inspiration and his chest wall is tender to palpation. It goes through to his back.   Today, the pain became worse, ASA this am no help, so he came to the ER. In the ER, he got SL NTG which gave him a headache but did not change the pain. Currently the pain is a 10/10, he appears uncomfortable.   Past Medical History  Diagnosis Date  . Hypertension   . COPD (chronic obstructive pulmonary disease)   . IBS (irritable bowel syndrome)    Bell's palsy     Bipolar 1 disorder .     Schizophrenia     Asthma     Incisional hernia at abdominal surgery site    Surgical history:  Past Surgical History  Cholecystectomy  Abdominal surgery  Knee arthroscopy with medial menisectomy  Left  02/05/2013  Procedure: KNEE ARTHROSCOPY ; Surgeon: Melina Schools, MD; Location: [facility]; Service: Orthopedics; Laterality: Left;   Irrigation and debridement knee  02/05/2013  Procedure: IRRIGATION AND DEBRIDEMENT KNEE patella bursa; Surgeon: Melina Schools, MD; Location: [facility]; Service: Orthopedics;;    Allergies  Allergen Reactions  . Flagyl [Metronidazole]     anaphylaxis  . Penicillins     anaphylaxis  . Phenergan [Promethazine Hcl]     seizures  . Vancomycin     Anaphylaxis   . Latex     rash    I have reviewed the patient's current medications     nitroGLYCERIN  Prior to  Admission medications   Medication Sig Start Date End Date Taking? Authorizing Provider  busPIRone (BUSPAR) 10 MG tablet Take 5-10 mg by mouth 3 (three) times daily.   Yes Historical Provider, MD  carbamazepine (TEGRETOL XR) 200 MG 12 hr tablet Take 400 mg by mouth 2 (two) times daily.   Yes Historical Provider, MD  lurasidone (LATUDA) 40 MG TABS tablet Take 40 mg by mouth every evening.   Yes Historical Provider, MD  venlafaxine (EFFEXOR) 75 MG tablet Take 75 mg by mouth 3 (three) times daily.   Yes Historical Provider, MD     History   Social History  . Marital Status: Single    Spouse Name: N/A    Number of Children: N/A  . Years of Education: N/A   Occupational History  . Not on file.   Social History Main Topics  . Smoking status: Current Every Day Smoker -- 0.50 packs/day    Types: Cigarettes  . Smokeless tobacco: Not on file  . Alcohol Use: No  . Drug Use: No  . Sexual Activity: Not on file   Other Topics Concern  . Not on file   Social History Narrative  . No narrative on file     Family History   Problem  Relation  Age of Onset   .  Stroke  Mother    .  CAD  Father      MI in his late 68s     ROS:  Full 14 point review of systems complete and found to be negative unless listed above.  Physical Exam: Blood pressure 118/87, pulse 78, temperature 97.2 F (36.2 C), temperature source Oral, resp. rate 15, SpO2 100.00%.  General: Well developed, well nourished, male in moderate acute distress Head: Eyes PERRLA, No xanthomas.   Normocephalic and atraumatic, oropharynx without edema or exudate. Dentition: good  Lungs: CTA bilaterally Heart: HRRR S1 S2, no rub/gallop, no murmur. pulses are 2+ all 4 extrem.   Neck: No carotid bruits. No lymphadenopathy.  JVD not elevated. Abdomen: Bowel sounds present, abdomen soft and non-tender without masses or hernias noted. Msk:  No spine or cva tenderness. No weakness, no joint deformities or effusions. Extremities: No  clubbing or cyanosis. No edema.  Neuro: Alert and oriented X 3. No focal deficits noted. Psych:  Good affect, responds appropriately Skin: No rashes or lesions noted.  Labs:   Lab Results  Component Value Date   WBC 5.0 03/12/2014   HGB 16.4 03/12/2014   HCT 45.5 03/12/2014   MCV 84.3 03/12/2014   PLT 232 03/12/2014     Recent Labs Lab 03/12/14 1315  NA 135*  K 3.7  CL 98  CO2 24  BUN 8  CREATININE 0.98  CALCIUM 9.1  GLUCOSE 120*    Recent Labs  03/12/14 1337  TROPIPOC 0.00   Pro B Natriuretic peptide (BNP)  Date/Time Value Ref Range Status  03/12/2014  1:15 PM 29.5  0 - 125 pg/mL Final   Echo: 01/16/2014 Study Conclusions - Left ventricle: The cavity size was normal. Wall thickness was at the upper limits of normal. Systolic function was normal. The estimated ejection fraction was in the range of 60% to 65%. Wall motion was normal; there were no regional wall motion abnormalities. Left ventricular diastolic function parameters were normal. - Mitral valve: There was trivial regurgitation. - Tricuspid valve: There was physiologic regurgitation. - Pulmonary arteries: Systolic pressure could not be accurately estimated. - Inferior vena cava: Not visualized. Unable to estimate CVP. - Pericardium, extracardiac: There was no pericardial effusion. Impressions: - Upper normal LV wall thickness with LVEF 95-28%, normal diastolic function. No major valvular abnormalities. No pericardial effusion.  ECG:  03/12/2014 SR, no acute changes Vent. rate 82 BPM PR interval 139 ms QRS duration 101 ms QT/QTc 368/430 ms P-R-T axes 34 0 27  Myoview: 01/16/2014 Lexiscan Myoview FINDINGS:  Immediate post regadenoson images demonstrate no significant focal perfusion defects. Slight diminished uptake in the inferior wall can be explained by diaphragmatic attenuation. Initial resting images demonstrate similar findings. No evidence of reversibility to suggest myocardial ischemia. Findings  are confirmed by the computer  generated polar map.  Gated images demonstrate satisfactory thickening throughout the left ventricular myocardium with normal wall motion throughout.  Estimated QGS left ventricular ejection fraction measured 72%, with an end-diastolic volume of 87 ml and an end systolic volume of 24 ml.  IMPRESSION:  1. No evidence of myocardial ischemia or infarction.  2. Normal left ventricular wall motion.  3. Estimated QGS ejection fraction 72%.  Electronically Signed  By: Evangeline Dakin M.D.  On: 01/17/2014 17:05   Radiology:  Dg Chest Port 1 View 03/12/2014   CLINICAL DATA:  Left-sided chest pain, pressure and shortness of breath.  EXAM: PORTABLE CHEST - 1 VIEW  COMPARISON:  None.  FINDINGS: Trachea is  midline. Heart size normal. Lungs are clear. No pleural fluid.  IMPRESSION: No acute findings.   Electronically Signed   By: Lorin Picket M.D.   On: 03/12/2014 13:33   Cardiac CT: July 2012 CORONARY ARTERIES:  Left main coronary artery: Left main segment measures  approximately 23 mm in. Normal.  Left anterior descending: Normal. 4 visible normal appearing  diagonal branches.  Left circumflex: Normal. 4 visible normal appearing obtuse  marginal branches.  Right coronary artery: Normal.  Posterior descending artery: Normal.  Dominance: Right.  CORONARY CALCIUM:  Total Agatston Score: 0  MESA database percentile: Not applicable. Database does not include patients at this age.  CARDIAC MEASUREMENTS:  Interventricular septum (6 - 12 mm): 11 mm  LV posterior wall (6 - 12 mm): 12 mm.  LV diameter in diastole (35 - 52 mm): 44 mm  AORTA AND PULMONARY MEASUREMENTS:  Aortic root (21 - 40 mm):  30 mm at the annulus  35 mm at the sinuses of Valsalva  23 mm at the sinotubular junction  Ascending aorta (< 40 mm): 26 mm  Descending aorta (< 40 mm): 23 mm  Main pulmonary artery: (< 30 mm): 25 mm  EXTRACARDIAC FINDINGS:  Visualized pulmonary parenchyma clear. No pleural  effusions. No lymphadenopathy within the visualized mediastinum. Visualized extreme upper abdomen unremarkable for the low dose unenhanced technique. Bone window images demonstrate minimal lower thoracic  spondylosis.  IMPRESSION:  1. No evidence of coronary artery disease. The patient's total coronary artery calcium score is 0.  2. No extracardiac findings.  3. Right coronary artery dominance.  ASSESSMENT AND PLAN:   The patient was seen today by Dr. Ellyn Hack, the patient evaluated and the data reviewed.  Principal Problem:   Precordial pain - ECG not acute and cardiac ez negative despite > 72 hours of pain. PE unlikely, but will ck d-dimer, sed rate and ANA. Stat echo looking for pericarditis or effusion. If negative, no further cardiac workup.  SignedRosaria Ferries, PA-C 03/12/2014 5:52 PM Beeper [phone]  Co-Sign MD

## 2014-03-12 NOTE — ED Notes (Signed)
Hospitalist at bedside 

## 2014-03-12 NOTE — Progress Notes (Addendum)
Echocardiogram 2D Echocardiogram STAT exam has been performed.  Nathaniel, [name] 03/12/2014, 7:31 PM

## 2014-03-12 NOTE — ED Notes (Signed)
Pt states that he has been having intermittent chest pain since his grandmother died here last 11-17-2022.  States he feels SOB, states that the pain is lt sided pressure.  When asked about cardiac hx, pt states "I was told I don't get enough oxygen to my heart".

## 2014-03-12 NOTE — Discharge Instructions (Signed)
Chest Wall Pain °Chest wall pain is pain in or around the bones and muscles of your chest. It may take up to 6 weeks to get better. It may take longer if you must stay physically active in your work and activities.  °CAUSES  °Chest wall pain may happen on its own. However, it may be caused by: °· A viral illness like the flu. °· Injury. °· Coughing. °· Exercise. °· Arthritis. °· Fibromyalgia. °· Shingles. °HOME CARE INSTRUCTIONS  °· Avoid overtiring physical activity. Try not to strain or perform activities that cause pain. This includes any activities using your chest or your abdominal and side muscles, especially if heavy weights are used. °· Put ice on the sore area. °¨ Put ice in a plastic bag. °¨ Place a towel between your skin and the bag. °¨ Leave the ice on for 15-20 minutes per hour while awake for the first 2 days. °· Only take over-the-counter or prescription medicines for pain, discomfort, or fever as directed by your caregiver. °SEEK IMMEDIATE MEDICAL CARE IF:  °· Your pain increases, or you are very uncomfortable. °· You have a fever. °· Your chest pain becomes worse. °· You have new, unexplained symptoms. °· You have nausea or vomiting. °· You feel sweaty or lightheaded. °· You have a cough with phlegm (sputum), or you cough up blood. °MAKE SURE YOU:  °· Understand these instructions. °· Will watch your condition. °· Will get help right away if you are not doing well or get worse. °Document Released: 07/04/2005 Document Revised: 09/26/2011 Document Reviewed: 02/28/2011 °ExitCare® Patient Information ©2015 [employer]. This information is not intended to replace advice given to you by your health care provider. Make sure you discuss any questions you have with your health care provider. ° ° °Emergency Department Resource Guide °1) Find a Doctor and Pay Out of Pocket °Although you won't have to find out who is covered by your insurance plan, it is a good idea to ask around and get recommendations. You  will then need to call the office and see if the doctor you have chosen will accept you as a new patient and what types of options they offer for patients who are self-pay. Some doctors offer discounts or will set up payment plans for their patients who do not have insurance, but you will need to ask so you aren't surprised when you get to your appointment. ° °2) Contact Your Local Health Department °Not all health departments have doctors that can see patients for sick visits, but many do, so it is worth a call to see if yours does. If you don't know where your local health department is, you can check in your phone book. The CDC also has a tool to help you locate your state's health department, and many state websites also have listings of all of their local health departments. ° °3) Find a Walk-in Clinic °If your illness is not likely to be very severe or complicated, you may want to try a walk in clinic. These are popping up all over the country in pharmacies, drugstores, and shopping centers. They're usually staffed by nurse practitioners or physician assistants that have been trained to treat common illnesses and complaints. They're usually fairly quick and inexpensive. However, if you have serious medical issues or chronic medical problems, these are probably not your best option. ° °No Primary Care Doctor: °- Call Health Connect at  832-8000 - they can help you locate a primary care doctor that    accepts your insurance, provides certain services, etc. °- Physician Referral Service- 1-800-533-3463 ° °Chronic Pain Problems: °Organization         Address  Phone   Notes  °[facility]  (336) 297-2271 Patients need to be referred by their primary care doctor.  ° °Medication Assistance: °Organization         Address  Phone   Notes  °[place] [facility] [address] °[place], [place] [address] (336) 641-8030 --Must be a resident of [place] °-- Must  have NO insurance coverage whatsoever (no Medicaid/ Medicare, etc.) °-- The pt. MUST have a primary care doctor that directs their care regularly and follows them in the community °  °[facility]  (866) 331-1348   °[employer]  (888) 892-1162   ° °Agencies that provide inexpensive medical care: °Organization         Address  Phone   Notes  °[facility]  (336) 832-8035   °[facility]    (336) 832-7272   °[facility] [address] °[place], [place] [address] (336) 832-4777   °[facility] [address], °[place] (336) 271-4999   °[facility]    (336) 373-0678   °[facility]    (336) 272-1050   °[facility] ° [address], [place] Phone:  (336) 832-4444, Fax:  (336) 832-4440 Hours of Operation:  9 am - 6 pm, M-F.  Also accepts Medicaid/Medicare and self-pay.  °[facility] ° [address], [place] Phone: (336) 832-3150, Fax: (336) 832-3151. Hours of Operation:  8:30 am - 5:30 pm, M-F.  Also accepts Medicaid and self-pay.  °[facility] [place] [address], [place] Phone: (336) 878-6027   °[facility] [address], [place], [place] (336)723-1848, Ext. 123 Mondays & Thursdays: 7-9 AM.  First 15 patients are seen on a first come, first serve basis. °  ° °Medicaid-accepting [place] Providers: ° °Organization         Address  Phone   Notes  °[facility] [address], [place] (336) 641-2100 Also accepts self-pay patients.  °[facility] [address], [place] ° (336) 856-9996   °[facility] [address], [place] (336) 288-8857   °[facility] [address] [place] [address], [place] (336) 299-7000   °Veita Bland [address], [place]  ° (336) 373-1557 Only accepts  City Access Medicaid patients after  they have their name applied to their card.  ° °Self-Pay (no insurance) in [place]: ° °Organization         Address  Phone   Notes  °Sickle Cell Patients, [facility] [address], [place] (336) 832-1970   °[facility] [address], [place] (336) 832-4400   °[facility] ° [address], [place] (336) 992-4800   °[facility]/Dr. Osei-Bonsu ° [address] [place] [address], [place] or [address], [place] (336) 841-8500 Phone number for both [place] and [place] locations is the same.  °[facility] [address], [place] (336) 299-0000   °[facility] [address] [place] [address],  or [address] (336) 852-7530 °(336) 878-2260   °[facility] [address], [place] (336) 350-1642, phone; (336) 294-5005, fax Sees patients 1st and 3rd Saturday of every month.  Must not qualify for public or private insurance (i.e. Medicaid, Medicare, [place] Health Choice, Veterans' Benefits) • Household income should be no more than 200% of the poverty level •The clinic cannot treat you if you are pregnant or think you are pregnant • Sexually transmitted diseases are not treated at the clinic.  ° ° °Dental Care: °Organization         Address  Phone  Notes  °[facility] [facility] [address], [place] (336) 641-6152 Accepts children up to age 21 who are enrolled in Medicaid or [place] Health Choice; pregnant women with a Medicaid card; and children who have applied for Medicaid or [place] Health Choice, but were declined, whose parents can pay a reduced fee at time of service.  °[facility] [place]  [address], [place] (336) 641-7733 Accepts children up to age 21 who are enrolled in Medicaid or [place] Health Choice; pregnant women with a Medicaid card; and children who  have applied for Medicaid or [place] Health Choice, but were declined, whose parents can pay a reduced fee at time of service.  °[facility] ° [address], [place] (336) 641-4533 Patients are seen by appointment only. Walk-ins are not accepted. [facility] will see patients 18 years of age and older. °Monday - Tuesday (8am-5pm) °Most Wednesdays (8:30-5pm) °$30 per visit, cash only  °[facility] ° [address], [place] (336) 641-4533 Patients are seen by appointment only. Walk-ins are not accepted. [facility] will see patients 18 years of age and older. °One Wednesday Evening (Monthly: Volunteer Based).  $30 per visit, cash only  °[facility]  (919) 537-3737 for adults; Children under age 4, call Graduate Pediatric Dentistry at (919) [phone]. Children aged 4-14, please call (919) [phone] to request a pediatric application. ° Dental services are provided in all areas of dental care including fillings, crowns and bridges, complete and partial dentures, implants, gum treatment, root canals, and extractions. Preventive care is also provided. Treatment is provided to both adults and children. °Patients are selected via a lottery and there is often a waiting list. °  °[facility] [address], ° ° (336) 763-8833 [email or web address] °  °[facility] [address], [place], [place] (336)723-1848, Ext. 123 Second and Fourth Thursday of each month, opens at 6:30 AM; Clinic ends at 9 AM.  Patients are seen on a first-come first-served basis, and a limited number are seen during each clinic.  ° °[facility] ° [address], [place], [place] (336) 723-7904   Eligibility Requirements °You must have lived in [place], [place], or [place] counties for at least the last three months. °  You cannot be eligible for state or federal sponsored healthcare insurance, including Veterans  Administration, Medicaid, or Medicare. °  You generally cannot be eligible for healthcare insurance through your employer.  °  How to apply: °Eligibility screenings are held every Tuesday and Wednesday afternoon from 1:00 pm until 4:00 pm. You do not need an appointment for the interview!  °[facility] [address], [place], [place] 336-631-2330   °[facility]  336-342-8273   °[facility]  336-703-3100   °[facility]    336-570-6415   ° °Behavioral Health Resources in the Community: °Intensive Outpatient Programs °Organization         Address  Phone  Notes  °[facility] [address], [place], [place] 336-878-6098   °[facility] [address], [place], [place] 336-832-9800   °[facility] [address], [place], [place] ° 336-882-2125   °[facility] [address],  °[place], [place] 1-800-853-5163 or 336-641-4981   °Substance Abuse Resources °Organization         Address  Phone  Notes  °[facility]  336-882-2125   °[facility]  336-784-9470   °[facility]  336-285-9073   °[facility]  336-845-3988   °Residential & Outpatient Substance Abuse Program  1-800-659-3381   °Psychological Services °Organization         Address  Phone  Notes  °[facility]  336- 832-9600   °[facility]  336- 378-7881   °[facility] [address], [place] 1-800-853-5163 or 336-641-4981   ° °Mobile Crisis Teams °Organization         Address  Phone  Notes  °[facility], Mobile Crisis Care Unit  1-877-626-1772   °Assertive °[facility] ° [address] [place], [place] 336-834-9664   °Sharon DeEsch [address] °[place] 336-554-5454   ° °Self-Help/Support Groups °Organization         Address  Phone             Notes  °[facility] [place] -  variety of support groups  336- 373-1402 Call for more information  °Narcotics Anonymous (NA), [facility] [address], °[place]  2 meetings at this location  ° °Residential Treatment Programs °Organization         Address  Phone  Notes  °[facility] [address],    °[place]  1-866-801-8205   °[facility] ° [address], [place], [place] 704-293-8524   °[facility] [address], [place] 336-845-3988 Admissions: 8am-3pm M-F  °[facility] [address]    °[place], [place] 336-841-1104   °[facility] [address], [place], [place] 336-379-7146   °[facility] [address]  °[place], [place] 336-285-9073   °[facility] - Intensive Outpatient [address], [place], [place] 336-852-3033   °[facility] [address]  °[place], [place] 1-877-615-2722 or 336-784-9470   °[facility] ([facility] [address] [place], [place] 336-227-7417 Accepts Medicaid  °[facility] [address]  °[place] 1-800-659-3381 Substance Abuse/Addiction Treatment  ° °[place] Behavioral Health Resources °Organization         Address  Phone  Notes  °[facility]  (888) 581-9988   °Julie Brannon, PhD [address] [place], [place]   (336) 349-5553 or (336) 951-0000   °[facility]   [address] °[place], [place] (336) 349-4454   °[facility] [address], [place], [place] (336) 342-8316 Insurance/Medicaid/sponsorship through [facility]  °[facility] [address]                                    [place],  (336) 342-8316 Therapy/tele-psych/case  °[facility] [address]  ° [place], [place] (336) 349-2233    °Dr. Arfeen  (336) 349-4544   °[facility] [place]  [employer] [place] [facility] 1) [address],  °2) [address], [place] °3)  [address], [place] (336) 349-3220 °(336) 342-7768 ° °(336) 342-8140   °[place] Child Abuse Hot[facility] (336) 342-1394 or (336) 342-3537 (After Hours)    ° ° ° °

## 2014-03-13 ENCOUNTER — Emergency Department (HOSPITAL_COMMUNITY)
Admission: EM | Admit: 2014-03-13 | Discharge: 2014-03-14 | Disposition: A | Discharge status: Home / Self Care | Payer: Self-pay | Attending: Emergency Medicine | Admitting: Emergency Medicine

## 2014-03-13 ENCOUNTER — Encounter (HOSPITAL_COMMUNITY): Payer: Self-pay | Admitting: Emergency Medicine

## 2014-03-13 DIAGNOSIS — R0789 Other chest pain: Secondary | ICD-10-CM | POA: Insufficient documentation

## 2014-03-13 DIAGNOSIS — Z880 Allergy status to penicillin: Secondary | ICD-10-CM | POA: Insufficient documentation

## 2014-03-13 DIAGNOSIS — J4489 Other specified chronic obstructive pulmonary disease: Secondary | ICD-10-CM | POA: Insufficient documentation

## 2014-03-13 DIAGNOSIS — F17200 Nicotine dependence, unspecified, uncomplicated: Secondary | ICD-10-CM | POA: Insufficient documentation

## 2014-03-13 DIAGNOSIS — Z8669 Personal history of other diseases of the nervous system and sense organs: Secondary | ICD-10-CM | POA: Insufficient documentation

## 2014-03-13 DIAGNOSIS — R51 Headache: Secondary | ICD-10-CM | POA: Insufficient documentation

## 2014-03-13 DIAGNOSIS — I10 Essential (primary) hypertension: Secondary | ICD-10-CM | POA: Insufficient documentation

## 2014-03-13 DIAGNOSIS — F209 Schizophrenia, unspecified: Secondary | ICD-10-CM | POA: Insufficient documentation

## 2014-03-13 DIAGNOSIS — J449 Chronic obstructive pulmonary disease, unspecified: Secondary | ICD-10-CM | POA: Insufficient documentation

## 2014-03-13 DIAGNOSIS — R519 Headache, unspecified: Secondary | ICD-10-CM

## 2014-03-13 DIAGNOSIS — R079 Chest pain, unspecified: Secondary | ICD-10-CM | POA: Insufficient documentation

## 2014-03-13 DIAGNOSIS — Z79899 Other long term (current) drug therapy: Secondary | ICD-10-CM | POA: Insufficient documentation

## 2014-03-13 DIAGNOSIS — F319 Bipolar disorder, unspecified: Secondary | ICD-10-CM | POA: Insufficient documentation

## 2014-03-13 DIAGNOSIS — Z8719 Personal history of other diseases of the digestive system: Secondary | ICD-10-CM | POA: Insufficient documentation

## 2014-03-13 DIAGNOSIS — R112 Nausea with vomiting, unspecified: Secondary | ICD-10-CM | POA: Insufficient documentation

## 2014-03-13 LAB — ANA: ANA: NEGATIVE

## 2014-03-13 NOTE — ED Notes (Signed)
Pt also reports starting 2 new medications on Tuesday: Effexor and Buspirone.

## 2014-03-13 NOTE — ED Notes (Signed)
Pt reports cheat pain, head pain and lightheaded. Pt seen here yesterday for chest pain yesterday. Pt says he woke up this am and felt better. Pt started feeling worse as the day progressed and also reports he fell today after a dizzy episode. Also says "I feel a little short of breath".

## 2014-03-13 NOTE — ED Notes (Signed)
Called to triage, no answer

## 2014-03-14 ENCOUNTER — Encounter (HOSPITAL_COMMUNITY): Payer: Self-pay | Admitting: Emergency Medicine

## 2014-03-14 ENCOUNTER — Emergency Department (HOSPITAL_COMMUNITY): Payer: Self-pay

## 2014-03-14 MED ORDER — SODIUM CHLORIDE 0.9 % IV BOLUS (SEPSIS)
1000.0000 mL | Freq: Once | INTRAVENOUS | Status: AC
  Administered 2014-03-14: 1000 mL via INTRAVENOUS

## 2014-03-14 MED ORDER — KETOROLAC TROMETHAMINE 30 MG/ML IJ SOLN
30.0000 mg | Freq: Once | INTRAMUSCULAR | Status: AC
  Administered 2014-03-14: 30 mg via INTRAVENOUS
  Filled 2014-03-14: qty 1

## 2014-03-14 MED ORDER — NAPROXEN 500 MG PO TABS
500.0000 mg | ORAL_TABLET | Freq: Two times a day (BID) | ORAL | Status: DC
Start: 2014-03-14 — End: 2014-06-04

## 2014-03-14 MED ORDER — ONDANSETRON 4 MG PO TBDP: 4.0000 mg | ORAL_TABLET | Freq: Three times a day (TID) | ORAL | Status: DC | PRN

## 2014-03-14 MED ORDER — ONDANSETRON HCL 4 MG/2ML IJ SOLN
4.0000 mg | Freq: Once | INTRAMUSCULAR | Status: AC
  Administered 2014-03-14: 4 mg via INTRAVENOUS
  Filled 2014-03-14: qty 2

## 2014-03-14 NOTE — Discharge Instructions (Signed)
Headache:  You are having a headache. No specific cause was found today for your headache. It may have been a migraine or other cause of headache. Stress, anxiety, fatigue, and depression are common triggers for headaches. Your headache today does not appear to be life-threatening or require hospitalization, but often the exact cause of headaches is not determined in the emergency department. Therefore, followup with your doctor is very important to find out what may have caused your headache, and whether or not you need any further diagnostic testing or treatment. Sometimes headaches can appear benign but then more serious symptoms can develop which should prompt an immediate reevaluation by your doctor or the emergency department.  Seek immediate medical attention if:  You develop possible problems with medications prescribed. The medications don't resolve your headache, if it recurs, or if you have multiple episodes of vomiting or can't take fluids by mouth You have a change from the usual headache. If you developed a sudden severe headache or confusion, become poorly responsive or faint, developed a fever above 100.4 or problems breathing, have a change in speech, vision, swallowing or understanding, or developed new weakness, numbness, tingling, incoordination or have a seizure.  If you don't have a family doctor to follow up with, see the follow up list below - call this morning for a follow-up appointment in the next 1-2 days.  RESOURCE GUIDE  Dental Problems  Patients with Medicaid: [facility]                     [facility] [address]                                           [address] Phone:  [phone]                                                  Phone:  510-2600  If unable to pay or uninsured, contact:  [facility] or [place] [facility] to become qualified for the adult dental clinic.  Chronic Pain Problems Contact [facility]  297-2271 Patients need to be referred by their primary care doctor.  Insufficient Money for Medicine Contact United Way:  call "211" or [facility] 271-5999.  No Primary Care Doctor Call [facility]  832-8000 Other agencies that provide inexpensive medical care    [facility]  832-8035    [facility]  832-7272    [facility]  271-5999    [facility]  832-4777    [facility]  373-0678    [facility]  272-1050  [facility]  Health  832-9600 [facility]  378-7881 [facility]   800 853-5163 (emergency services 641-4993)  Substance Abuse Resources [facility]  336-882-2125 [facility] 336-784-9470 [facility] 336-285-9073 [facility] 336-845-3988 Residential & Outpatient Substance Abuse Program  800-659-3381  Abuse/Neglect [place] Child Abuse Hotline (336) 641-3795 [place] [facility] Hotline 800-378-5315 (After Hours)  [facility]ter [facility] (336) 271-5985  [facility] (336) 275-9566 [facility] (704) 372-4663  MRSA Hotline #:     832-7006    [place] [facility]  [facility] [place]     [employer]                          [facility] [address] [place]                       [address]      [address]  [place]                                                [place]                            [place] Phone:  349-3220                                   Phone:  342-7768                 Phone:  342-8140  [facility] Phone:  342-8316  [place] Child Abuse Hotline (336) 342-1394 (336) 342-3537 (After Hours)   

## 2014-03-14 NOTE — ED Provider Notes (Signed)
CSN: [id number]     Arrival date & time 03/13/14  2251 History   First MD Initiated Contact with Patient 03/14/14 0016     No chief complaint on file.    (Consider location/radiation/quality/duration/timing/severity/associated sxs/prior Treatment) HPI Comments: 33 year old male with a history of bipolar disorder, schizophrenia, COPD who presents with the complaint of chest pain, headache and persistent vomiting today. According to the medical record and the patient he has been seen multiple times for chest pain and in the month of July he had a normal echocardiogram and a normal stress test showing no signs of cardiac ischemia or abnormality. The chest pain that he is having today is the same as that pain. Earlier in the day he developed a headache, vertigo and ongoing nausea and vomiting. He has been unable to tolerate fluids or food during the latter part of the day and feels dehydrated. The headache is persistent, he does not get frequent headaches, the headache was gradual in onset and is a diffuse headache, and is nonfocal. There is no sensitivity to light, no numbness or weakness of his arms or legs though he does note some facial tingling. No difficulty speaking, no changes in vision  The history is provided by the patient, the spouse and medical records.    Past Medical History  Diagnosis Date  . COPD (chronic obstructive pulmonary disease)   . Bell's palsy   . IBS (irritable bowel syndrome)   . Hypertension   . Bipolar 1 disorder   . Schizophrenia   . Asthma    Past Surgical History  Procedure Laterality Date  . Cholecystectomy    . Abdominal surgery    . Incision and drainage Left 02/05/2013    Dr Rolena Infante  . Knee arthroscopy with medial menisectomy Left 02/05/2013    Procedure: KNEE ARTHROSCOPY  ;  Surgeon: Melina Schools, MD;  Location: [facility];  Service: Orthopedics;  Laterality: Left;  . Irrigation and debridement knee  02/05/2013    Procedure: IRRIGATION AND DEBRIDEMENT KNEE  patella bursa;  Surgeon: Melina Schools, MD;  Location: [facility];  Service: Orthopedics;;   Family History  Problem Relation Age of Onset  . Stroke Mother   . CAD Father     MI in his late 49s   History  Substance Use Topics  . Smoking status: Current Every Day Smoker -- 0.50 packs/day for 12 years    Types: Cigarettes  . Smokeless tobacco: Never Used  . Alcohol Use: No    Review of Systems  All other systems reviewed and are negative.     Allergies  Penicillins; Flagyl; Latex; Phenergan; and Vancomycin  Home Medications   Prior to Admission medications   Medication Sig Start Date End Date Taking? Authorizing Provider  busPIRone (BUSPAR) 10 MG tablet Take 10 mg by mouth 3 (three) times daily.   Yes Historical Provider, MD  carbamazepine (TEGRETOL XR) 200 MG 12 hr tablet Take 400 mg by mouth 2 (two) times daily.    Yes Historical Provider, MD  lurasidone (LATUDA) 40 MG TABS tablet Take 40 mg by mouth daily.    Yes Historical Provider, MD  venlafaxine (EFFEXOR) 75 MG tablet Take 75 mg by mouth 3 (three) times daily with meals.   Yes Historical Provider, MD  naproxen (NAPROSYN) 500 MG tablet Take 1 tablet (500 mg total) by mouth 2 (two) times daily with a meal. 03/14/14   Johnna Acosta, MD  ondansetron (ZOFRAN ODT) 4 MG disintegrating tablet Take 1 tablet (  4 mg total) by mouth every 8 (eight) hours as needed for nausea. 03/14/14   Johnna Acosta, MD   BP 150/95  Temp(Src) 98.6 F (37 C) (Oral)  Resp 20 Physical Exam  Nursing note and vitals reviewed. Constitutional: He appears well-developed and well-nourished. No distress.  HENT:  Head: Normocephalic and atraumatic.  Mouth/Throat: Oropharynx is clear and moist. No oropharyngeal exudate.  Eyes: Conjunctivae and EOM are normal. Pupils are equal, round, and reactive to light. Right eye exhibits no discharge. Left eye exhibits no discharge. No scleral icterus.  Neck: Normal range of motion. Neck supple. No JVD present. No  thyromegaly present.  Cardiovascular: Normal rate, regular rhythm, normal heart sounds and intact distal pulses.  Exam reveals no gallop and no friction rub.   No murmur heard. Pulmonary/Chest: Effort normal and breath sounds normal. No respiratory distress. He has no wheezes. He has no rales.  Abdominal: Soft. Bowel sounds are normal. He exhibits no distension and no mass. There is no tenderness.  Musculoskeletal: Normal range of motion. He exhibits no edema and no tenderness.  Lymphadenopathy:    He has no cervical adenopathy.  Neurological: He is alert. Coordination normal.  Speech is clear, cranial nerves III through XII are intact, memory is intact, strength is normal in all 4 extremities including grips, sensation is intact to light touch and pinprick in all 4 extremities. Coordination as tested by finger-nose-finger is normal, no limb ataxia. Normal gait,   Skin: Skin is warm and dry. No rash noted. No erythema.  Psychiatric: He has a normal mood and affect. His behavior is normal.    ED Course  Procedures (including critical care time) Labs Review Labs Reviewed - No data to display  Imaging Review Ct Head Wo Contrast  03/14/2014   CLINICAL DATA:  severe Headache  EXAM: CT HEAD WITHOUT CONTRAST  TECHNIQUE: Contiguous axial images were obtained from the base of the skull through the vertex without intravenous contrast.  COMPARISON:  Prior study from 02/06/2011  FINDINGS: There is no acute intracranial hemorrhage or infarct. No mass lesion or midline shift. Gray-white matter differentiation is well maintained. Ventricles are normal in size without evidence of hydrocephalus. CSF containing spaces are within normal limits. No extra-axial fluid collection. Prominent calcification along the left tentorium is stable.  The calvarium is intact.  Orbital soft tissues are within normal limits.  The paranasal sinuses and mastoid air cells are well pneumatized and free of fluid.  Scalp soft tissues  are unremarkable.  IMPRESSION: No acute intracranial process.   Electronically Signed   By: Jeannine Boga M.D.   On: 03/14/2014 00:59     EKG Interpretation   Date/Time:  Thursday March 13 2014 22:55:18 EDT Ventricular Rate:  88 PR Interval:  137 QRS Duration: 104 QT Interval:  354 QTC Calculation: 428 R Axis:   21 Text Interpretation:  Sinus rhythm RSR' in V1 or V2, right VCD or RVH  Baseline wander in lead(s) V6 ECG OTHERWISE WITHIN NORMAL LIMITS since  last tracing no significant change Confirmed by Majed Pellegrin  MD, Saje Gallop ([id number])  on 03/13/2014 11:03:14 PM      MDM   Final diagnoses:  Other chest pain  Nonintractable headache, unspecified chronicity pattern, unspecified headache type  Non-intractable vomiting with nausea, vomiting of unspecified type    Headache, persistent nausea and vomiting, possibly headache syndrome, unlikely to be a secondary headache, no focal neurologic deficits, no nystagmus or inducible vertigo and the patient is not having vertigo this  time, chest pain unlikely to be cardiac in nature given normal EKG here as well as normal workup in the last month. Symptomatically, fluids, nausea medications, will obtain imaging of the head.  Discussed CT scan results with the patient, normal head CT, symptoms improved with medications as below, stable for discharge, patient amenable to discharge with local followup.   Meds given in ED:  Medications  ketorolac (TORADOL) 30 MG/ML injection 30 mg (not administered)  sodium chloride 0.9 % bolus 1,000 mL (1,000 mLs Intravenous New Bag/Given 03/14/14 0100)  ondansetron (ZOFRAN) injection 4 mg (4 mg Intravenous Given 03/14/14 0101)  ketorolac (TORADOL) 30 MG/ML injection 30 mg (30 mg Intravenous Given 03/14/14 0100)    New Prescriptions   NAPROXEN (NAPROSYN) 500 MG TABLET    Take 1 tablet (500 mg total) by mouth 2 (two) times daily with a meal.   ONDANSETRON (ZOFRAN ODT) 4 MG DISINTEGRATING TABLET    Take 1 tablet  (4 mg total) by mouth every 8 (eight) hours as needed for nausea.      Johnna Acosta, MD 03/14/14 [phone]

## 2014-03-14 NOTE — ED Notes (Signed)
Pt will not answer any questions asked by the RN, wife answers when he won't.

## 2014-03-25 ENCOUNTER — Ambulatory Visit: Payer: Self-pay | Attending: Internal Medicine | Admitting: Internal Medicine

## 2014-03-25 ENCOUNTER — Encounter: Payer: Self-pay | Admitting: Internal Medicine

## 2014-03-25 VITALS — BP 132/89 | HR 89 | Temp 97.9°F | Ht 73.0 in | Wt 246.8 lb

## 2014-03-25 DIAGNOSIS — R112 Nausea with vomiting, unspecified: Secondary | ICD-10-CM

## 2014-03-25 DIAGNOSIS — J4489 Other specified chronic obstructive pulmonary disease: Secondary | ICD-10-CM

## 2014-03-25 DIAGNOSIS — J449 Chronic obstructive pulmonary disease, unspecified: Secondary | ICD-10-CM

## 2014-03-25 DIAGNOSIS — Z139 Encounter for screening, unspecified: Secondary | ICD-10-CM

## 2014-03-25 DIAGNOSIS — R062 Wheezing: Secondary | ICD-10-CM

## 2014-03-25 DIAGNOSIS — F319 Bipolar disorder, unspecified: Secondary | ICD-10-CM

## 2014-03-25 DIAGNOSIS — Z720 Tobacco use: Secondary | ICD-10-CM

## 2014-03-25 DIAGNOSIS — R072 Precordial pain: Secondary | ICD-10-CM

## 2014-03-25 DIAGNOSIS — F17200 Nicotine dependence, unspecified, uncomplicated: Secondary | ICD-10-CM

## 2014-03-25 LAB — CBC WITH DIFFERENTIAL/PLATELET
Basophils Absolute: 0 10*3/uL (ref 0.0–0.1)
Basophils Relative: 0 % (ref 0–1)
EOS ABS: 0.4 10*3/uL (ref 0.0–0.7)
Eosinophils Relative: 4 % (ref 0–5)
HCT: 48.1 % (ref 39.0–52.0)
Hemoglobin: 17.2 g/dL — ABNORMAL HIGH (ref 13.0–17.0)
Lymphocytes Relative: 16 % (ref 12–46)
Lymphs Abs: 1.5 10*3/uL (ref 0.7–4.0)
MCH: 29.7 pg (ref 26.0–34.0)
MCHC: 35.8 g/dL (ref 30.0–36.0)
MCV: 83.1 fL (ref 78.0–100.0)
MONOS PCT: 10 % (ref 3–12)
Monocytes Absolute: 1 10*3/uL (ref 0.1–1.0)
Neutro Abs: 6.7 10*3/uL (ref 1.7–7.7)
Neutrophils Relative %: 70 % (ref 43–77)
PLATELETS: 241 10*3/uL (ref 150–400)
RBC: 5.79 MIL/uL (ref 4.22–5.81)
RDW: 13.1 % (ref 11.5–15.5)
WBC: 9.5 10*3/uL (ref 4.0–10.5)

## 2014-03-25 LAB — COMPLETE METABOLIC PANEL WITH GFR
ALBUMIN: 4 g/dL (ref 3.5–5.2)
ALT: 33 U/L (ref 0–53)
AST: 23 U/L (ref 0–37)
Alkaline Phosphatase: 106 U/L (ref 39–117)
BILIRUBIN TOTAL: 0.5 mg/dL (ref 0.2–1.2)
BUN: 5 mg/dL — AB (ref 6–23)
CALCIUM: 9.3 mg/dL (ref 8.4–10.5)
CHLORIDE: 104 meq/L (ref 96–112)
CO2: 28 meq/L (ref 19–32)
CREATININE: 1.17 mg/dL (ref 0.50–1.35)
GFR, Est African American: >89 mL/min
GFR, Est Non African American: 83 mL/min
Glucose, Bld: 94 mg/dL (ref 70–99)
POTASSIUM: 4.1 meq/L (ref 3.5–5.3)
SODIUM: 140 meq/L (ref 135–145)
Total Protein: 6.7 g/dL (ref 6.0–8.3)

## 2014-03-25 LAB — LIPID PANEL
CHOL/HDL RATIO: 7.2 ratio
CHOLESTEROL: 195 mg/dL (ref 0–200)
HDL: 27 mg/dL — ABNORMAL LOW (ref >39)
LDL Cholesterol: 101 mg/dL — ABNORMAL HIGH (ref 0–99)
Triglycerides: 334 mg/dL — ABNORMAL HIGH (ref <150)
VLDL: 67 mg/dL — AB (ref 0–40)

## 2014-03-25 MED ORDER — ALBUTEROL SULFATE HFA 108 (90 BASE) MCG/ACT IN AERS: 2.0000 | INHALATION_SPRAY | Freq: Four times a day (QID) | RESPIRATORY_TRACT | Status: DC | PRN

## 2014-03-25 MED ORDER — ONDANSETRON 4 MG PO TBDP: 4.0000 mg | ORAL_TABLET | Freq: Three times a day (TID) | ORAL | Status: DC | PRN

## 2014-03-25 NOTE — Progress Notes (Signed)
Patient Demographics  Nathaniel [name], is a 33 y.o. male  FYB:[id number]  NID:[id number]  DOB - 1981/04/18  CC:  Chief Complaint  Patient presents with  . Establish Care  . Follow-up       HPI: Nathaniel [name] is a 33 y.o. male here today to establish medical care. Patient has history of bipolar disorder schizophrenia COPD, recently went to the emergency complaining of chest pain headache vomiting, EMR reviewed patient had several similar he always is, he had normal echocardiogram stress test with no signs of cardiac ischemia already evaluated by cardiologist, patient also recently had a CT scan of head done which was negative for any acute intracranial process, patient was discharged with naproxen as well as Zofran when necessary for nausea. As per patient he then out of the medication and had some nausea and vomiting today at that time he felt some dizziness as per patient he is trying to drink Gatorade , patient also history of COPD reported to have some shortness of breath and wheezing currently only using Symbicort also patient smokes one pack cigarettes a day, denies any fever chills. Patient is also following her with psych.   Allergies  Allergen Reactions  . Flagyl [Metronidazole]     anaphylaxis  . Penicillins Anaphylaxis  . Penicillins     anaphylaxis  . Phenergan [Promethazine Hcl]     seizures  . Vancomycin     Anaphylaxis   . Latex     rash  . Flagyl [Metronidazole] Swelling  . Latex Hives  . Phenergan [Promethazine Hcl] Other (See Comments)    Seizure  . Vancomycin Swelling    Throat closed up   Past Medical History  Diagnosis Date  . COPD (chronic obstructive pulmonary disease)   . Bell's palsy   . IBS (irritable bowel syndrome)   . Hypertension   . Bipolar 1 disorder   . Schizophrenia   . Asthma    Current Outpatient Prescriptions on File Prior to Visit  Medication Sig Dispense Refill  . busPIRone (BUSPAR) 10 MG tablet Take 10 mg by mouth 3  (three) times daily.      . carbamazepine (TEGRETOL XR) 200 MG 12 hr tablet Take 400 mg by mouth 2 (two) times daily.       Marland Kitchen ibuprofen (ADVIL,MOTRIN) 800 MG tablet Take 1 tablet (800 mg total) by mouth every 8 (eight) hours as needed for mild pain.  30 tablet  0  . lurasidone (LATUDA) 40 MG TABS tablet Take 40 mg by mouth every evening.      Marland Kitchen HYDROcodone-acetaminophen (NORCO/VICODIN) 5-325 MG per tablet Take 1 tablet by mouth every 4 (four) hours as needed.  15 tablet  0  . naproxen (NAPROSYN) 500 MG tablet Take 1 tablet (500 mg total) by mouth 2 (two) times daily with a meal.  30 tablet  0  . venlafaxine (EFFEXOR) 75 MG tablet Take 75 mg by mouth 3 (three) times daily with meals.       No current facility-administered medications on file prior to visit.   Family History  Problem Relation Age of Onset  . Hypertension Mother   . CAD Father     MI in his late 3s  . Stroke Father   . Diabetes Paternal Grandfather    History   Social History  . Marital Status: Single    Spouse Name: N/A    Number of Children: 2  . Years of Education: N/A   Occupational History  .  Not on file.   Social History Main Topics  . Smoking status: Current Every Day Smoker -- 1.00 packs/day for 12 years    Types: Cigarettes  . Smokeless tobacco: Never Used  . Alcohol Use: Yes     Comment: occasionallty   . Drug Use: No  . Sexual Activity: Not on file   Other Topics Concern  . Not on file   Social History Narrative   ** Merged History Encounter **        Review of Systems: Constitutional: Negative for fever, chills, diaphoresis, activity change, appetite change and fatigue. HENT: Negative for ear pain, nosebleeds, congestion, facial swelling, rhinorrhea, neck pain, neck stiffness and ear discharge.  Eyes: Negative for pain, discharge, redness, itching and visual disturbance. Respiratory: Negative for cough, choking, chest tightness, shortness of breath, wheezing and stridor.  Cardiovascular:  Negative for chest pain, palpitations and leg swelling. Gastrointestinal: Negative for abdominal distention. Genitourinary: Negative for dysuria, urgency, frequency, hematuria, flank pain, decreased urine volume, difficulty urinating and dyspareunia.  Musculoskeletal: Negative for back pain, joint swelling, arthralgia and gait problem. Neurological: Negative for dizziness, tremors, seizures, syncope, facial asymmetry, speech difficulty, weakness, light-headedness, numbness and headaches.  Hematological: Negative for adenopathy. Does not bruise/bleed easily. Psychiatric/Behavioral: Negative for hallucinations, behavioral problems, confusion, dysphoric mood, decreased concentration and agitation.    Objective:   Filed Vitals:   03/25/14 1620  BP: 132/89  Pulse: 89  Temp: 97.9 F (36.6 C)    Physical Exam: Constitutional: Patient appears well-developed and well-nourished. No distress. HENT: Normocephalic, atraumatic, External right and left ear normal. Oropharynx is clear and moist.  Eyes: Conjunctivae and EOM are normal. PERRLA, no scleral icterus. Neck: Normal ROM. Neck supple. No JVD. No tracheal deviation. No thyromegaly. CVS: RRR, S1/S2 +, no murmurs, no gallops, no carotid bruit.  Pulmonary: Effort and breath sounds normal, no stridor, rhonchi, +wheezes, rales. Some chest wall tenderness with palpation.  Abdominal: Soft. BS +, no distension, tenderness, rebound or guarding. Old midline surgical scar  Musculoskeletal: Normal range of motion. No edema and no tenderness.  Neuro: Alert. Normal reflexes, muscle tone coordination. No cranial nerve deficit. Skin: Skin is warm and dry. No rash noted. Not diaphoretic. No erythema. No pallor. Psychiatric: Normal mood and affect. Behavior, judgment, thought content normal.  Lab Results  Component Value Date   WBC 5.0 03/12/2014   HGB 16.4 03/12/2014   HCT 45.5 03/12/2014   MCV 84.3 03/12/2014   PLT 232 03/12/2014   Lab Results  Component  Value Date   CREATININE 0.98 03/12/2014   BUN 8 03/12/2014   NA 135* 03/12/2014   K 3.7 03/12/2014   CL 98 03/12/2014   CO2 24 03/12/2014    Lab Results  Component Value Date   HGBA1C 5.2 02/06/2013   Lipid Panel     Component Value Date/Time   CHOL 207* 01/17/2014 0447   TRIG 236* 01/17/2014 0447   HDL 29* 01/17/2014 0447   CHOLHDL 7.1 01/17/2014 0447   VLDL 47* 01/17/2014 0447   LDLCALC 131* 01/17/2014 0447       Assessment and plan:   1. Precordial pain Patient is taking naproxen when necessary for pain  2. Tobacco abuse Advise patient to quit smoking  3. Chronic obstructive pulmonary disease, unspecified COPD, unspecified chronic bronchitis type Currently patient is on Symbicort, I have prescribed albuterol when necessary for wheezing. Advised patient to quit smoking.  4. Wheezing  - albuterol (PROVENTIL HFA;VENTOLIN HFA) 108 (90 BASE) MCG/ACT inhaler; Inhale 2  puffs into the lungs every 6 (six) hours as needed for wheezing or shortness of breath.  Dispense: 1 Inhaler; Refill: 2  5. Bipolar disorder, unspecified Currently patient is on BuSpar Latuda Tegretol Effexor and following up psychiatrist.  6. Nausea and vomiting, vomiting of unspecified type Patient is given refill on the medication also advised to keep himself hydrated, will check electrolytes. - ondansetron (ZOFRAN ODT) 4 MG disintegrating tablet; Take 1 tablet (4 mg total) by mouth every 8 (eight) hours as needed for nausea.  Dispense: 30 tablet; Refill: 1 - COMPLETE METABOLIC PANEL WITH GFR  7. Screening Ordered baseline blood work - CBC with Differential - TSH - Lipid panel - Vit D  25 hydroxy (rtn osteoporosis monitoring)   Return in about 3 months (around 06/24/2014), or if symptoms worsen or fail to improve, for COPD.   Lorayne Marek, MD

## 2014-03-25 NOTE — Progress Notes (Signed)
Patient is here to establish care today. Was seen in ED for chest pain and vomiting. Patient states that he feels lightheaded and some dizziness due to the vomiting for 9 days. Patient believes that he is dehydrated. Pain scale right now is 10/10.

## 2014-03-26 LAB — TSH: TSH: 0.606 u[IU]/mL (ref 0.350–4.500)

## 2014-03-26 LAB — VITAMIN D 25 HYDROXY (VIT D DEFICIENCY, FRACTURES): Vit D, 25-Hydroxy: 35 ng/mL (ref 30–89)

## 2014-04-01 ENCOUNTER — Ambulatory Visit: Payer: MEDICAID

## 2014-04-03 ENCOUNTER — Emergency Department: Payer: Self-pay | Admitting: Student

## 2014-04-03 ENCOUNTER — Telehealth: Payer: Self-pay | Admitting: Internal Medicine

## 2014-04-03 ENCOUNTER — Ambulatory Visit: Payer: Self-pay | Attending: Internal Medicine

## 2014-04-03 ENCOUNTER — Telehealth: Payer: Self-pay

## 2014-04-03 DIAGNOSIS — R112 Nausea with vomiting, unspecified: Secondary | ICD-10-CM

## 2014-04-03 LAB — COMPREHENSIVE METABOLIC PANEL
ALT: 48 U/L
ANION GAP: 11 (ref 7–16)
Albumin: 3.3 g/dL — ABNORMAL LOW (ref 3.4–5.0)
Alkaline Phosphatase: 101 U/L
BUN: 7 mg/dL (ref 7–18)
Bilirubin,Total: 0.3 mg/dL (ref 0.2–1.0)
CALCIUM: 8.6 mg/dL (ref 8.5–10.1)
CHLORIDE: 106 mmol/L (ref 98–107)
CREATININE: 1.04 mg/dL (ref 0.60–1.30)
Co2: 23 mmol/L (ref 21–32)
Glucose: 110 mg/dL — ABNORMAL HIGH (ref 65–99)
Osmolality: 278 (ref 275–301)
POTASSIUM: 3.3 mmol/L — AB (ref 3.5–5.1)
SGOT(AST): 25 U/L (ref 15–37)
Sodium: 140 mmol/L (ref 136–145)
Total Protein: 6.6 g/dL (ref 6.4–8.2)

## 2014-04-03 LAB — CBC WITH DIFFERENTIAL/PLATELET
Basophil #: 0.1 10*3/uL (ref 0.0–0.1)
Basophil %: 0.9 %
EOS ABS: 0.3 10*3/uL (ref 0.0–0.7)
Eosinophil %: 3.2 %
HCT: 48.4 % (ref 40.0–52.0)
HGB: 16.4 g/dL (ref 13.0–18.0)
LYMPHS PCT: 31.2 %
Lymphocyte #: 2.8 10*3/uL (ref 1.0–3.6)
MCH: 29.6 pg (ref 26.0–34.0)
MCHC: 34 g/dL (ref 32.0–36.0)
MCV: 87 fL (ref 80–100)
MONO ABS: 0.5 x10 3/mm (ref 0.2–1.0)
MONOS PCT: 5.8 %
NEUTROS ABS: 5.3 10*3/uL (ref 1.4–6.5)
Neutrophil %: 58.9 %
PLATELETS: 295 10*3/uL (ref 150–440)
RBC: 5.55 10*6/uL (ref 4.40–5.90)
RDW: 12.9 % (ref 11.5–14.5)
WBC: 8.9 10*3/uL (ref 3.8–10.6)

## 2014-04-03 LAB — TROPONIN I: Troponin-I: <0.02 ng/mL

## 2014-04-03 NOTE — Telephone Encounter (Signed)
Patient is very concerned because even though he is taking the medications prescribed he still is in pain and the nausea doesn't stop either. Patient is at the [facility] and would like to talk with PCP's Nurse. Please follow up with Patient.

## 2014-04-03 NOTE — Telephone Encounter (Signed)
Message copied by Dorothe Pea on Thu Apr 03, 2014  4:44 PM ------      Message from: Lorayne Marek      Created: Wed Mar 26, 2014  9:35 AM       Blood work reviewed, noticed elevated triglycerides, advise patient for low fat diet.        ------

## 2014-04-03 NOTE — Telephone Encounter (Signed)
Patient is aware of his lab results Patient complains of still vomiting for past three weeks Instructed patient if the vomiting gets worse to go to the ed Referral placed in epic for GI doctor

## 2014-04-04 ENCOUNTER — Encounter: Payer: Self-pay | Admitting: Internal Medicine

## 2014-04-04 LAB — URINALYSIS, COMPLETE
Bacteria: NONE SEEN
Bilirubin,UR: NEGATIVE
Blood: NEGATIVE
GLUCOSE, UR: NEGATIVE mg/dL (ref 0–75)
Ketone: NEGATIVE
Leukocyte Esterase: NEGATIVE
Nitrite: NEGATIVE
PH: 6 (ref 4.5–8.0)
Protein: NEGATIVE
RBC,UR: NONE SEEN /HPF (ref 0–5)
SPECIFIC GRAVITY: 1.047 (ref 1.003–1.030)
Squamous Epithelial: NONE SEEN
WBC UR: <1 /HPF (ref 0–5)

## 2014-04-17 ENCOUNTER — Emergency Department: Payer: Self-pay | Admitting: Emergency Medicine

## 2014-04-23 ENCOUNTER — Emergency Department: Payer: Self-pay | Admitting: Emergency Medicine

## 2014-06-04 ENCOUNTER — Encounter: Payer: Self-pay | Admitting: Internal Medicine

## 2014-06-04 ENCOUNTER — Ambulatory Visit (INDEPENDENT_AMBULATORY_CARE_PROVIDER_SITE_OTHER): Payer: Managed Care, Other (non HMO) | Admitting: Internal Medicine

## 2014-06-04 VITALS — BP 120/86 | HR 88 | Ht 73.0 in | Wt 247.8 lb

## 2014-06-04 DIAGNOSIS — R1011 Right upper quadrant pain: Secondary | ICD-10-CM

## 2014-06-04 DIAGNOSIS — K219 Gastro-esophageal reflux disease without esophagitis: Secondary | ICD-10-CM

## 2014-06-04 DIAGNOSIS — G8929 Other chronic pain: Secondary | ICD-10-CM

## 2014-06-04 DIAGNOSIS — R1010 Upper abdominal pain, unspecified: Secondary | ICD-10-CM

## 2014-06-04 MED ORDER — OMEPRAZOLE 40 MG PO CPDR: 40.0000 mg | DELAYED_RELEASE_CAPSULE | Freq: Every day | ORAL | Status: AC

## 2014-06-04 NOTE — Progress Notes (Signed)
HISTORY OF PRESENT ILLNESS:  Nathaniel [name] "Gerald Stabs" is a 33 y.o. male with COPD, chronic tobacco abuse, bipolar disorder, asthma, hypertension, and schizophrenia. He also carries the diagnosis of IBS. Reviewed the patient's records shows that he has been evaluated by multiple physicians on multiple occasions for multiple somatic complaints. He presents today with essentially positive GI review of systems including right-sided abdominal pain, epigastric pain, acid reflux, bloating, chest pain, belching, nausea, vomiting, 10 pound weight loss, dark stools, hemorrhoids, and "liver problems". He reports regular bowel habits without bleeding. He states that he has had right-sided abdominal pain for 3 months. Review of the records shows that he underwent CT scan in May 2015 for abdominal pain 4 days post-colonoscopy. This was negative for acute abnormality but did show subtle minimal enhancement of the terminal ileum. The patient tells me that he has had prior colonoscopy, upper endoscopy, capsule endoscopy with GI evaluations in [place] as well as [place]. Evaluations as recent as this year. Reports he had colonoscopy in [place] with follow-up in 3 years recommended. Now complaining of nausea and vomiting after meals. Significant acid reflux symptoms. He is on multiple psychiatric medications. He also reports having had complicated gallbladder surgery at [facility] in 2009. Sounds like he had injury to his intestine as well as bile leak requiring ERCP with stent placement. No records are available for any evidence prior GI evaluations.Marland Kitchen  REVIEW OF SYSTEMS:  All non-GI ROS negative except for anxiety, arthritis, back pain, depression, headaches, muscle cramps, shortness of breath, sleeping problems, excessive thirst  Past Medical History  Diagnosis Date  . COPD (chronic obstructive pulmonary disease)   . Bell's palsy   . IBS (irritable bowel syndrome)   . Hypertension   . Bipolar  1 disorder   . Schizophrenia   . Asthma     Past Surgical History  Procedure Laterality Date  . Cholecystectomy    . Abdominal surgery    . Incision and drainage Left 02/05/2013    Dr Rolena Infante  . Knee arthroscopy with medial menisectomy Left 02/05/2013    Procedure: KNEE ARTHROSCOPY  ;  Surgeon: Melina Schools, MD;  Location: [facility];  Service: Orthopedics;  Laterality: Left;  . Irrigation and debridement knee  02/05/2013    Procedure: IRRIGATION AND DEBRIDEMENT KNEE patella bursa;  Surgeon: Melina Schools, MD;  Location: [facility];  Service: Orthopedics;;    Social History Baldemar Friday  reports that he has been smoking Cigarettes.  He has a 12 pack-year smoking history. He has never used smokeless tobacco. He reports that he does not drink alcohol or use illicit drugs.  family history includes CAD in his father; Clotting disorder in his maternal grandfather; Diabetes in his paternal grandfather; Heart disease in his maternal grandfather; Hypertension in his mother; Pancreatic cancer in his maternal grandmother; Prostate cancer in his father; Stroke in his father.  Allergies  Allergen Reactions  . Flagyl [Metronidazole]     anaphylaxis  . Penicillins Anaphylaxis  . Phenergan [Promethazine Hcl]     seizures  . Vancomycin     Anaphylaxis   . Latex     rash       PHYSICAL EXAMINATION: Vital signs: BP 120/86 mmHg  Pulse 88  Ht 6\' 1"  (1.854 m)  Wt 247 lb 12.8 oz (112.401 kg)  BMI 32.70 kg/m2  Constitutional: generally well-appearing, no acute distress Psychiatric: alert and oriented x3, cooperative Eyes: extraocular movements intact, anicteric, conjunctiva pink Mouth: oral pharynx moist,  no lesions Neck: supple no lymphadenopathy Cardiovascular: heart regular rate and rhythm, no murmur Lungs: clear to auscultation bilaterally Abdomen: soft,obese, nontender, nondistended, no obvious ascites, no peritoneal signs, normal bowel sounds, no organomegaly. No succussion splash. Midline  incision well-healed Rectal:ommitted Extremities: no lower extremity edema bilaterally Skin: back tattoo,no lesions on visible extremities Neuro: No focal deficits. No asterixis.    ASSESSMENT:  #1. Complicated 33 year old with significant psychiatric history, a history of chronic somatic complaints, and fragmented medical care who presents today with multiple GI complaints. As best I can distill things, he appears to have untreated GERD with associated nausea and vomiting. As well, chronic abdominal complaints without functional etiology. Most recent CT scan noted, including questionable mild ileal thickening.   PLAN:  #1. Reflux precautions with attention to weight loss  #2. Stop smoking  #3. Prescribe omeprazole 40 mg daily #4. Get outside GI records for review #5. Office follow-up in 6 weeks #6. Resume general medical care with PCP and psychiatric care with psychiatrist

## 2014-06-04 NOTE — Patient Instructions (Signed)
We have sent the following medications to your pharmacy for you to pick up at your convenience:  Omeprazole  Please follow up with Dr. Henrene Pastor on 07/29/2014 at 10:45am.

## 2014-06-09 ENCOUNTER — Emergency Department: Payer: Self-pay | Admitting: Emergency Medicine

## 2014-06-09 LAB — CBC
HCT: 46.9 % (ref 40.0–52.0)
HGB: 15.9 g/dL (ref 13.0–18.0)
MCH: 29.9 pg (ref 26.0–34.0)
MCHC: 34 g/dL (ref 32.0–36.0)
MCV: 88 fL (ref 80–100)
PLATELETS: 210 10*3/uL (ref 150–440)
RBC: 5.33 10*6/uL (ref 4.40–5.90)
RDW: 12.7 % (ref 11.5–14.5)
WBC: 6 10*3/uL (ref 3.8–10.6)

## 2014-06-09 LAB — COMPREHENSIVE METABOLIC PANEL
ALK PHOS: 94 U/L
ALT: 49 U/L
Albumin: 3.4 g/dL (ref 3.4–5.0)
Anion Gap: 7 (ref 7–16)
BUN: 9 mg/dL (ref 7–18)
Bilirubin,Total: 0.4 mg/dL (ref 0.2–1.0)
CREATININE: 0.96 mg/dL (ref 0.60–1.30)
Calcium, Total: 8.2 mg/dL — ABNORMAL LOW (ref 8.5–10.1)
Chloride: 107 mmol/L (ref 98–107)
Co2: 27 mmol/L (ref 21–32)
EGFR (Non-African Amer.): >60
Glucose: 79 mg/dL (ref 65–99)
OSMOLALITY: 279 (ref 275–301)
Potassium: 3.9 mmol/L (ref 3.5–5.1)
SGOT(AST): 34 U/L (ref 15–37)
Sodium: 141 mmol/L (ref 136–145)
TOTAL PROTEIN: 6.7 g/dL (ref 6.4–8.2)

## 2014-06-09 LAB — LIPASE, BLOOD: Lipase: 112 U/L (ref 73–393)

## 2014-06-26 ENCOUNTER — Ambulatory Visit: Payer: Self-pay | Admitting: Surgery

## 2014-06-26 LAB — CBC WITH DIFFERENTIAL/PLATELET
Basophil #: 0.1 10*3/uL (ref 0.0–0.1)
Basophil %: 1.2 %
EOS ABS: 0.3 10*3/uL (ref 0.0–0.7)
EOS PCT: 5.5 %
HCT: 47.9 % (ref 40.0–52.0)
HGB: 16 g/dL (ref 13.0–18.0)
Lymphocyte #: 1.7 10*3/uL (ref 1.0–3.6)
Lymphocyte %: 29 %
MCH: 29.7 pg (ref 26.0–34.0)
MCHC: 33.4 g/dL (ref 32.0–36.0)
MCV: 89 fL (ref 80–100)
Monocyte #: 0.4 x10 3/mm (ref 0.2–1.0)
Monocyte %: 7.5 %
NEUTROS PCT: 56.8 %
Neutrophil #: 3.4 10*3/uL (ref 1.4–6.5)
Platelet: 227 10*3/uL (ref 150–440)
RBC: 5.38 10*6/uL (ref 4.40–5.90)
RDW: 12.6 % (ref 11.5–14.5)
WBC: 6 10*3/uL (ref 3.8–10.6)

## 2014-06-26 LAB — BASIC METABOLIC PANEL
Anion Gap: 5 — ABNORMAL LOW (ref 7–16)
BUN: 9 mg/dL (ref 7–18)
CHLORIDE: 105 mmol/L (ref 98–107)
Calcium, Total: 8.5 mg/dL (ref 8.5–10.1)
Co2: 29 mmol/L (ref 21–32)
Creatinine: 1.06 mg/dL (ref 0.60–1.30)
EGFR (African American): >60
EGFR (Non-African Amer.): >60
Glucose: 99 mg/dL (ref 65–99)
Osmolality: 276 (ref 275–301)
POTASSIUM: 4 mmol/L (ref 3.5–5.1)
SODIUM: 139 mmol/L (ref 136–145)

## 2014-07-03 ENCOUNTER — Ambulatory Visit: Payer: Self-pay | Admitting: Surgery

## 2014-07-04 ENCOUNTER — Observation Stay: Payer: Self-pay | Admitting: Surgery

## 2014-07-04 LAB — CBC WITH DIFFERENTIAL/PLATELET
BASOS ABS: 0 10*3/uL (ref 0.0–0.1)
BASOS PCT: 0.2 %
EOS PCT: 0.4 %
Eosinophil #: 0 10*3/uL (ref 0.0–0.7)
HCT: 44.1 % (ref 40.0–52.0)
HGB: 14.8 g/dL (ref 13.0–18.0)
LYMPHS ABS: 1.8 10*3/uL (ref 1.0–3.6)
Lymphocyte %: 15.2 %
MCH: 29.9 pg (ref 26.0–34.0)
MCHC: 33.6 g/dL (ref 32.0–36.0)
MCV: 89 fL (ref 80–100)
Monocyte #: 0.8 x10 3/mm (ref 0.2–1.0)
Monocyte %: 6.8 %
NEUTROS ABS: 9.3 10*3/uL — AB (ref 1.4–6.5)
Neutrophil %: 77.4 %
Platelet: 227 10*3/uL (ref 150–440)
RBC: 4.97 10*6/uL (ref 4.40–5.90)
RDW: 12.8 % (ref 11.5–14.5)
WBC: 12 10*3/uL — ABNORMAL HIGH (ref 3.8–10.6)

## 2014-07-04 LAB — BASIC METABOLIC PANEL
Anion Gap: 8 (ref 7–16)
BUN: 7 mg/dL (ref 7–18)
CHLORIDE: 104 mmol/L (ref 98–107)
Calcium, Total: 8.5 mg/dL (ref 8.5–10.1)
Co2: 28 mmol/L (ref 21–32)
Creatinine: 1.01 mg/dL (ref 0.60–1.30)
EGFR (African American): >60
GLUCOSE: 88 mg/dL (ref 65–99)
Osmolality: 277 (ref 275–301)
POTASSIUM: 3.7 mmol/L (ref 3.5–5.1)
Sodium: 140 mmol/L (ref 136–145)

## 2014-07-29 ENCOUNTER — Ambulatory Visit: Payer: Self-pay | Admitting: Internal Medicine

## 2014-07-30 ENCOUNTER — Emergency Department: Payer: Self-pay | Admitting: Emergency Medicine

## 2014-08-15 ENCOUNTER — Ambulatory Visit: Payer: Self-pay | Admitting: Orthopedic Surgery

## 2014-08-19 ENCOUNTER — Emergency Department: Payer: Self-pay | Admitting: Emergency Medicine

## 2014-08-19 LAB — CBC WITH DIFFERENTIAL/PLATELET
BASOS PCT: 1 %
Basophil #: 0.1 10*3/uL (ref 0.0–0.1)
EOS PCT: 3.1 %
Eosinophil #: 0.3 10*3/uL (ref 0.0–0.7)
HCT: 48.2 % (ref 40.0–52.0)
HGB: 16.2 g/dL (ref 13.0–18.0)
LYMPHS ABS: 2.2 10*3/uL (ref 1.0–3.6)
Lymphocyte %: 26.5 %
MCH: 29.7 pg (ref 26.0–34.0)
MCHC: 33.6 g/dL (ref 32.0–36.0)
MCV: 89 fL (ref 80–100)
MONO ABS: 0.6 x10 3/mm (ref 0.2–1.0)
MONOS PCT: 7.1 %
NEUTROS PCT: 62.3 %
Neutrophil #: 5.2 10*3/uL (ref 1.4–6.5)
PLATELETS: 215 10*3/uL (ref 150–440)
RBC: 5.44 10*6/uL (ref 4.40–5.90)
RDW: 12.9 % (ref 11.5–14.5)
WBC: 8.4 10*3/uL (ref 3.8–10.6)

## 2014-08-19 LAB — URINALYSIS, COMPLETE
BACTERIA: NONE SEEN
Bilirubin,UR: NEGATIVE
Blood: NEGATIVE
GLUCOSE, UR: NEGATIVE mg/dL (ref 0–75)
Ketone: NEGATIVE
Leukocyte Esterase: NEGATIVE
NITRITE: NEGATIVE
PH: 6 (ref 4.5–8.0)
Protein: NEGATIVE
RBC,UR: <1 /HPF (ref 0–5)
SQUAMOUS EPITHELIAL: NONE SEEN
Specific Gravity: 1.008 (ref 1.003–1.030)
WBC UR: <1 /HPF (ref 0–5)

## 2014-08-19 LAB — COMPREHENSIVE METABOLIC PANEL
ANION GAP: 6 — AB (ref 7–16)
AST: 15 U/L (ref 15–37)
Albumin: 3.5 g/dL (ref 3.4–5.0)
Alkaline Phosphatase: 82 U/L (ref 46–116)
BUN: 13 mg/dL (ref 7–18)
Bilirubin,Total: 0.3 mg/dL (ref 0.2–1.0)
CHLORIDE: 103 mmol/L (ref 98–107)
CO2: 30 mmol/L (ref 21–32)
Calcium, Total: 9.3 mg/dL (ref 8.5–10.1)
Creatinine: 1.03 mg/dL (ref 0.60–1.30)
EGFR (Non-African Amer.): >60
Glucose: 73 mg/dL (ref 65–99)
Osmolality: 276 (ref 275–301)
POTASSIUM: 3.8 mmol/L (ref 3.5–5.1)
SGPT (ALT): 32 U/L (ref 14–63)
Sodium: 139 mmol/L (ref 136–145)
TOTAL PROTEIN: 7 g/dL (ref 6.4–8.2)

## 2014-08-26 ENCOUNTER — Telehealth: Payer: Self-pay

## 2014-08-26 NOTE — Telephone Encounter (Signed)
Called patient to see if I could get his signature on a release before his appointment so I could get his records from [facility].  Patient lives in [place] and has no access to a fax machine so I asked him to call [facility] and ask them to fax the records to me.  Patient agreed.

## 2014-08-28 ENCOUNTER — Ambulatory Visit: Payer: Self-pay | Admitting: Internal Medicine

## 2014-08-28 ENCOUNTER — Encounter: Payer: Self-pay | Admitting: Internal Medicine

## 2014-09-23 ENCOUNTER — Ambulatory Visit: Payer: Self-pay | Admitting: Internal Medicine

## 2014-09-29 ENCOUNTER — Emergency Department: Payer: Self-pay | Admitting: Emergency Medicine

## 2014-10-09 ENCOUNTER — Encounter (HOSPITAL_COMMUNITY): Payer: Self-pay | Admitting: Emergency Medicine

## 2014-10-09 ENCOUNTER — Emergency Department (HOSPITAL_COMMUNITY)
Admission: EM | Admit: 2014-10-09 | Discharge: 2014-10-09 | Disposition: A | Discharge status: Home / Self Care | Payer: Managed Care, Other (non HMO) | Attending: Emergency Medicine | Admitting: Emergency Medicine

## 2014-10-09 DIAGNOSIS — Z880 Allergy status to penicillin: Secondary | ICD-10-CM | POA: Insufficient documentation

## 2014-10-09 DIAGNOSIS — Z91040 Latex allergy status: Secondary | ICD-10-CM | POA: Insufficient documentation

## 2014-10-09 DIAGNOSIS — I10 Essential (primary) hypertension: Secondary | ICD-10-CM | POA: Diagnosis not present

## 2014-10-09 DIAGNOSIS — F319 Bipolar disorder, unspecified: Secondary | ICD-10-CM | POA: Insufficient documentation

## 2014-10-09 DIAGNOSIS — Z79899 Other long term (current) drug therapy: Secondary | ICD-10-CM | POA: Insufficient documentation

## 2014-10-09 DIAGNOSIS — J449 Chronic obstructive pulmonary disease, unspecified: Secondary | ICD-10-CM | POA: Diagnosis not present

## 2014-10-09 DIAGNOSIS — Z720 Tobacco use: Secondary | ICD-10-CM | POA: Insufficient documentation

## 2014-10-09 DIAGNOSIS — Z8719 Personal history of other diseases of the digestive system: Secondary | ICD-10-CM | POA: Insufficient documentation

## 2014-10-09 DIAGNOSIS — R21 Rash and other nonspecific skin eruption: Secondary | ICD-10-CM | POA: Insufficient documentation

## 2014-10-09 DIAGNOSIS — L299 Pruritus, unspecified: Secondary | ICD-10-CM | POA: Insufficient documentation

## 2014-10-09 DIAGNOSIS — Z8669 Personal history of other diseases of the nervous system and sense organs: Secondary | ICD-10-CM | POA: Insufficient documentation

## 2014-10-09 MED ORDER — HYDROCORTISONE 1 % EX CREA: TOPICAL_CREAM | CUTANEOUS | Status: DC

## 2014-10-09 MED ORDER — DIPHENHYDRAMINE HCL 25 MG PO TABS: 50.0000 mg | ORAL_TABLET | ORAL | Status: DC | PRN

## 2014-10-09 MED ORDER — RANITIDINE HCL 150 MG PO TABS
150.0000 mg | ORAL_TABLET | Freq: Two times a day (BID) | ORAL | Status: DC
Start: 2014-10-09 — End: 2014-10-20

## 2014-10-09 MED ORDER — CARRINGTON MOISTURE BARRIER EX CREA: TOPICAL_CREAM | CUTANEOUS | Status: DC | PRN

## 2014-10-09 NOTE — Discharge Instructions (Signed)
Allergies °Allergies may happen from anything your body is sensitive to. This may be food, medicines, pollens, chemicals, and nearly anything around you in everyday life that produces allergens. An allergen is anything that causes an allergy producing substance. Heredity is often a factor in causing these problems. This means you may have some of the same allergies as your parents. °Food allergies happen in all age groups. Food allergies are some of the most severe and life threatening. Some common food allergies are cow's milk, seafood, eggs, nuts, wheat, and soybeans. °SYMPTOMS  °· Swelling around the mouth. °· An itchy red rash or hives. °· Vomiting or diarrhea. °· Difficulty breathing. °SEVERE ALLERGIC REACTIONS ARE LIFE-THREATENING. °This reaction is called anaphylaxis. It can cause the mouth and throat to swell and cause difficulty with breathing and swallowing. In severe reactions only a trace amount of food (for example, peanut oil in a salad) may cause death within seconds. °Seasonal allergies occur in all age groups. These are seasonal because they usually occur during the same season every year. They may be a reaction to molds, grass pollens, or tree pollens. Other causes of problems are house dust mite allergens, pet dander, and mold spores. The symptoms often consist of nasal congestion, a runny itchy nose associated with sneezing, and tearing itchy eyes. There is often an associated itching of the mouth and ears. The problems happen when you come in contact with pollens and other allergens. Allergens are the particles in the air that the body reacts to with an allergic reaction. This causes you to release allergic antibodies. Through a chain of events, these eventually cause you to release histamine into the blood stream. Although it is meant to be protective to the body, it is this release that causes your discomfort. This is why you were given anti-histamines to feel better.  If you are unable to  pinpoint the offending allergen, it may be determined by skin or blood testing. Allergies cannot be cured but can be controlled with medicine. °Hay fever is a collection of all or some of the seasonal allergy problems. It may often be treated with simple over-the-counter medicine such as diphenhydramine. Take medicine as directed. Do not drink alcohol or drive while taking this medicine. Check with your caregiver or package insert for child dosages. °If these medicines are not effective, there are many new medicines your caregiver can prescribe. Stronger medicine such as nasal spray, eye drops, and corticosteroids may be used if the first things you try do not work well. Other treatments such as immunotherapy or desensitizing injections can be used if all else fails. Follow up with your caregiver if problems continue. These seasonal allergies are usually not life threatening. They are generally more of a nuisance that can often be handled using medicine. °HOME CARE INSTRUCTIONS  °· If unsure what causes a reaction, keep a diary of foods eaten and symptoms that follow. Avoid foods that cause reactions. °· If hives or rash are present: °· Take medicine as directed. °· You may use an over-the-counter antihistamine (diphenhydramine) for hives and itching as needed. °· Apply cold compresses (cloths) to the skin or take baths in cool water. Avoid hot baths or showers. Heat will make a rash and itching worse. °· If you are severely allergic: °· Following a treatment for a severe reaction, hospitalization is often required for closer follow-up. °· Wear a medic-alert bracelet or necklace stating the allergy. °· You and your family must learn how to give adrenaline or use   an anaphylaxis kit.  If you have had a severe reaction, always carry your anaphylaxis kit or EpiPen with you. Use this medicine as directed by your caregiver if a severe reaction is occurring. Failure to do so could have a fatal outcome. SEEK MEDICAL  CARE IF:  You suspect a food allergy. Symptoms generally happen within 30 minutes of eating a food.  Your symptoms have not gone away within 2 days or are getting worse.  You develop new symptoms.  You want to retest yourself or your child with a food or drink you think causes an allergic reaction. Never do this if an anaphylactic reaction to that food or drink has happened before. Only do this under the care of a caregiver. SEEK IMMEDIATE MEDICAL CARE IF:   You have difficulty breathing, are wheezing, or have a tight feeling in your chest or throat.  You have a swollen mouth, or you have hives, swelling, or itching all over your body.  You have had a severe reaction that has responded to your anaphylaxis kit or an EpiPen. These reactions may return when the medicine has worn off. These reactions should be considered life threatening. MAKE SURE YOU:   Understand these instructions.  Will watch your condition.  Will get help right away if you are not doing well or get worse. Document Released: 09/27/2002 Document Revised: 10/29/2012 Document Reviewed: 03/03/2008 [facility] [place] [facility] Patient Information 2015 [place], [place]. This information is not intended to replace advice given to you by your health care provider. Make sure you discuss any questions you have with your health care provider.  Contact Dermatitis Contact dermatitis is a reaction to certain substances that touch the skin. Contact dermatitis can be either irritant contact dermatitis or allergic contact dermatitis. Irritant contact dermatitis does not require previous exposure to the substance for a reaction to occur.Allergic contact dermatitis only occurs if you have been exposed to the substance before. Upon a repeat exposure, your body reacts to the substance.  CAUSES  Many substances can cause contact dermatitis. Irritant dermatitis is most commonly caused by repeated exposure to mildly irritating substances, such  as:  Makeup.  Soaps.  Detergents.  Bleaches.  Acids.  Metal salts, such as nickel. Allergic contact dermatitis is most commonly caused by exposure to:  Poisonous plants.  Chemicals (deodorants, shampoos).  Jewelry.  Latex.  Neomycin in triple antibiotic cream.  Preservatives in products, including clothing. SYMPTOMS  The area of skin that is exposed may develop:  Dryness or flaking.  Redness.  Cracks.  Itching.  Pain or a burning sensation.  Blisters. With allergic contact dermatitis, there may also be swelling in areas such as the eyelids, mouth, or genitals.  DIAGNOSIS  Your caregiver can usually tell what the problem is by doing a physical exam. In cases where the cause is uncertain and an allergic contact dermatitis is suspected, a patch skin test may be performed to help determine the cause of your dermatitis. TREATMENT Treatment includes protecting the skin from further contact with the irritating substance by avoiding that substance if possible. Barrier creams, powders, and gloves may be helpful. Your caregiver may also recommend:  Steroid creams or ointments applied 2 times daily. For best results, soak the rash area in cool water for 20 minutes. Then apply the medicine. Cover the area with a plastic wrap. You can store the steroid cream in the refrigerator for a "chilly" effect on your rash. That may decrease itching. Oral steroid medicines may be needed in more severe  cases. °· Antibiotics or antibacterial ointments if a skin infection is present. °· Antihistamine lotion or an antihistamine taken by mouth to ease itching. °· Lubricants to keep moisture in your skin. °· Burow's solution to reduce redness and soreness or to dry a weeping rash. Mix one packet or tablet of solution in 2 cups cool water. Dip a clean washcloth in the mixture, wring it out a bit, and put it on the affected area. Leave the cloth in place for 30 minutes. Do this as often as possible  throughout the day. °· Taking several cornstarch or baking soda baths daily if the area is too large to cover with a washcloth. °Harsh chemicals, such as alkalis or acids, can cause skin damage that is like a burn. You should flush your skin for 15 to 20 minutes with cold water after such an exposure. You should also seek immediate medical care after exposure. Bandages (dressings), antibiotics, and pain medicine may be needed for severely irritated skin.  °HOME CARE INSTRUCTIONS °· Avoid the substance that caused your reaction. °· Keep the area of skin that is affected away from hot water, soap, sunlight, chemicals, acidic substances, or anything else that would irritate your skin. °· Do not scratch the rash. Scratching may cause the rash to become infected. °· You may take cool baths to help stop the itching. °· Only take over-the-counter or prescription medicines as directed by your caregiver. °· See your caregiver for follow-up care as directed to make sure your skin is healing properly. °SEEK MEDICAL CARE IF:  °· Your condition is not better after 3 days of treatment. °· You seem to be getting worse. °· You see signs of infection such as swelling, tenderness, redness, soreness, or warmth in the affected area. °· You have any problems related to your medicines. °Document Released: 07/01/2000 Document Revised: 09/26/2011 Document Reviewed: 12/07/2010 °[employer]® Patient Information ©2015 [employer]. This information is not intended to replace advice given to you by your health care provider. Make sure you discuss any questions you have with your health care provider. ° °

## 2014-10-09 NOTE — ED Provider Notes (Signed)
CSN: [id number]     Arrival date & time 10/09/14  0008 History   First MD Initiated Contact with Patient 10/09/14 [phone]     Chief Complaint  Patient presents with  . Pruritis     (Consider location/radiation/quality/duration/timing/severity/associated sxs/prior Treatment) HPI Nathaniel [name] is a 34 y.o. male who comes in for evaluation of itchy forearms and legs. Patient states yesterday afternoon he was pulling weeds in the garden when, once he completed, he began to itch intensely in his lower legs and forearms. He thinks "I may have gotten into something". He denies any fevers, take bites, myalgias, difficulties breathing, lip or throat swelling, nausea or vomiting, abdominal pain. He has tried lotions and other anti-itch creams without relief. No other aggravating or modifying factors. No new household products, new soaps, detergents or body washes.  Past Medical History  Diagnosis Date  . COPD (chronic obstructive pulmonary disease)   . Bell's palsy   . IBS (irritable bowel syndrome)   . Hypertension   . Bipolar 1 disorder   . Schizophrenia   . Asthma    Past Surgical History  Procedure Laterality Date  . Cholecystectomy    . Abdominal surgery    . Incision and drainage Left 02/05/2013    Dr Rolena Infante  . Knee arthroscopy with medial menisectomy Left 02/05/2013    Procedure: KNEE ARTHROSCOPY  ;  Surgeon: Melina Schools, MD;  Location: [facility];  Service: Orthopedics;  Laterality: Left;  . Irrigation and debridement knee  02/05/2013    Procedure: IRRIGATION AND DEBRIDEMENT KNEE patella bursa;  Surgeon: Melina Schools, MD;  Location: [facility] OR;  Service: Orthopedics;;   Family History  Problem Relation Age of Onset  . Hypertension Mother   . CAD Father     MI in his late 35s  . Stroke Father   . Diabetes Paternal Grandfather   . Pancreatic cancer Maternal Grandmother   . Prostate cancer Father   . Clotting disorder Maternal Grandfather   . Heart disease Maternal Grandfather    History   Substance Use Topics  . Smoking status: Current Every Day Smoker -- 1.00 packs/day for 12 years    Types: Cigarettes  . Smokeless tobacco: Never Used  . Alcohol Use: No     Comment: occasionallty     Review of Systems  Constitutional: Negative for fever.  Respiratory: Negative for shortness of breath.   Cardiovascular: Negative for chest pain.  Skin: Positive for rash.      Allergies  Flagyl; Lithium; Penicillins; Phenergan; Vancomycin; and Latex  Home Medications   Prior to Admission medications   Medication Sig Start Date End Date Taking? Authorizing Provider  albuterol (PROVENTIL HFA;VENTOLIN HFA) 108 (90 BASE) MCG/ACT inhaler Inhale 2 puffs into the lungs every 6 (six) hours as needed for wheezing or shortness of breath. 03/25/14   Lorayne Marek, MD  busPIRone (BUSPAR) 10 MG tablet Take 10 mg by mouth 3 (three) times daily.    Historical Provider, MD  carbamazepine (TEGRETOL XR) 200 MG 12 hr tablet Take 400 mg by mouth 2 (two) times daily.     Historical Provider, MD  diphenhydrAMINE (BENADRYL) 25 MG tablet Take 2 tablets (50 mg total) by mouth every 4 (four) hours as needed for itching. 10/09/14   Comer Locket, PA-C  hydrocortisone cream 1 % Apply to affected area 2 times daily 10/09/14   Comer Locket, PA-C  lurasidone (LATUDA) 40 MG TABS tablet Take 40 mg by mouth every evening.    Historical  Provider, MD  omeprazole (PRILOSEC) 40 MG capsule Take 1 capsule (40 mg total) by mouth daily. 06/04/14   Irene Shipper, MD  ranitidine (ZANTAC) 150 MG tablet Take 1 tablet (150 mg total) by mouth 2 (two) times daily. 10/09/14   Comer Locket, PA-C  Skin Protectants, Misc. (EUCERIN) cream Apply topically as needed for wound care. 10/09/14   Comer Locket, PA-C  venlafaxine (EFFEXOR) 75 MG tablet Take 75 mg by mouth 3 (three) times daily with meals.    Historical Provider, MD   BP 130/71 mmHg  Pulse 86  Temp(Src) 98.1 F (36.7 C) (Oral)  Resp 16  SpO2 94% Physical Exam   Constitutional:  Awake, alert, nontoxic appearance.  HENT:  Head: Atraumatic.  Eyes: Right eye exhibits no discharge. Left eye exhibits no discharge.  Neck: Neck supple.  Cardiovascular: Normal rate, regular rhythm and normal heart sounds.   Pulmonary/Chest: Effort normal. He exhibits no tenderness.  Abdominal: Soft. There is no tenderness. There is no rebound.  Musculoskeletal: He exhibits no tenderness.  Baseline ROM, no obvious new focal weakness.  Neurological:  Mental status and motor strength appears baseline for patient and situation.  Skin: No rash noted.  Very small, diffuse maculopapular lesions to broad aspects of bilateral forearms and shins. No evidence of burrowing. No linear pattern of dermatitis. Mild excoriation  Psychiatric: He has a normal mood and affect.  Nursing note and vitals reviewed.   ED Course  Procedures (including critical care time) Labs Review Labs Reviewed - No data to display  Imaging Review No results found.   EKG Interpretation None      MDM  Vitals stable - WNL -afebrile Pt resting comfortably in ED. PE--consistent with a contact dermatitis.   DDX--no evidence of anaphylaxis, scabies, tick bite/RMSF/Lyme. Patient likely experiencing contact dermatitis. Will DC with Benadryl, Zantac, hydrocortisone cream, Eucerin lotion. I discussed all relevant lab findings and imaging results with pt and they verbalized understanding. Discussed f/u with PCP within 48 hrs and return precautions, pt very amenable to plan.  Final diagnoses:  Rash  Pruritus        Comer Locket, PA-C 10/09/14 [id number]  Ernestina Patches, MD 10/09/14 [phone]

## 2014-10-09 NOTE — ED Notes (Signed)
Pt c/o rash with itching on bil arms and bil legs

## 2014-10-09 NOTE — ED Notes (Signed)
Patient here with complaint of skin itching all over his body starting 2 days ago. States that he was weeding in his yard and may have been exposed to something there. Denies new soaps or detergents. No obvious rash on skin, but patient has been scratching to the point of causing lesions.

## 2014-10-19 ENCOUNTER — Encounter (HOSPITAL_COMMUNITY): Payer: Self-pay

## 2014-10-19 ENCOUNTER — Observation Stay (HOSPITAL_COMMUNITY)
Admission: EM | Admit: 2014-10-19 | Discharge: 2014-10-21 | Disposition: A | Discharge status: Home / Self Care | Payer: Managed Care, Other (non HMO) | Attending: Family Medicine | Admitting: Family Medicine

## 2014-10-19 ENCOUNTER — Emergency Department (HOSPITAL_COMMUNITY): Payer: Managed Care, Other (non HMO)

## 2014-10-19 DIAGNOSIS — Z880 Allergy status to penicillin: Secondary | ICD-10-CM | POA: Diagnosis not present

## 2014-10-19 DIAGNOSIS — R079 Chest pain, unspecified: Secondary | ICD-10-CM | POA: Diagnosis present

## 2014-10-19 DIAGNOSIS — K589 Irritable bowel syndrome without diarrhea: Secondary | ICD-10-CM | POA: Diagnosis not present

## 2014-10-19 DIAGNOSIS — I10 Essential (primary) hypertension: Secondary | ICD-10-CM | POA: Diagnosis not present

## 2014-10-19 DIAGNOSIS — R519 Headache, unspecified: Secondary | ICD-10-CM

## 2014-10-19 DIAGNOSIS — Z79899 Other long term (current) drug therapy: Secondary | ICD-10-CM | POA: Diagnosis not present

## 2014-10-19 DIAGNOSIS — I639 Cerebral infarction, unspecified: Secondary | ICD-10-CM | POA: Diagnosis not present

## 2014-10-19 DIAGNOSIS — F319 Bipolar disorder, unspecified: Secondary | ICD-10-CM | POA: Insufficient documentation

## 2014-10-19 DIAGNOSIS — M6281 Muscle weakness (generalized): Secondary | ICD-10-CM | POA: Insufficient documentation

## 2014-10-19 DIAGNOSIS — Z720 Tobacco use: Secondary | ICD-10-CM | POA: Insufficient documentation

## 2014-10-19 DIAGNOSIS — Z91040 Latex allergy status: Secondary | ICD-10-CM | POA: Insufficient documentation

## 2014-10-19 DIAGNOSIS — R51 Headache: Secondary | ICD-10-CM | POA: Diagnosis not present

## 2014-10-19 DIAGNOSIS — J449 Chronic obstructive pulmonary disease, unspecified: Secondary | ICD-10-CM | POA: Insufficient documentation

## 2014-10-19 DIAGNOSIS — G510 Bell's palsy: Secondary | ICD-10-CM | POA: Diagnosis not present

## 2014-10-19 DIAGNOSIS — Z7952 Long term (current) use of systemic steroids: Secondary | ICD-10-CM | POA: Insufficient documentation

## 2014-10-19 DIAGNOSIS — R29898 Other symptoms and signs involving the musculoskeletal system: Secondary | ICD-10-CM

## 2014-10-19 DIAGNOSIS — F209 Schizophrenia, unspecified: Secondary | ICD-10-CM | POA: Insufficient documentation

## 2014-10-19 DIAGNOSIS — G43109 Migraine with aura, not intractable, without status migrainosus: Secondary | ICD-10-CM | POA: Diagnosis present

## 2014-10-19 LAB — COMPREHENSIVE METABOLIC PANEL
ALK PHOS: 76 U/L (ref 39–117)
ALT: 28 U/L (ref 0–53)
AST: 23 U/L (ref 0–37)
Albumin: 3.6 g/dL (ref 3.5–5.2)
Anion gap: 11 (ref 5–15)
BILIRUBIN TOTAL: 0.3 mg/dL (ref 0.3–1.2)
BUN: 8 mg/dL (ref 6–23)
CO2: 24 mmol/L (ref 19–32)
Calcium: 8.2 mg/dL — ABNORMAL LOW (ref 8.4–10.5)
Chloride: 102 mmol/L (ref 96–112)
Creatinine, Ser: 1.08 mg/dL (ref 0.50–1.35)
GFR calc non Af Amer: >90 mL/min (ref >90)
GLUCOSE: 84 mg/dL (ref 70–99)
POTASSIUM: 3.3 mmol/L — AB (ref 3.5–5.1)
Sodium: 137 mmol/L (ref 135–145)
Total Protein: 6.5 g/dL (ref 6.0–8.3)

## 2014-10-19 LAB — CBC
HCT: 44.9 % (ref 39.0–52.0)
HEMOGLOBIN: 15.9 g/dL (ref 13.0–17.0)
MCH: 30.6 pg (ref 26.0–34.0)
MCHC: 35.4 g/dL (ref 30.0–36.0)
MCV: 86.3 fL (ref 78.0–100.0)
Platelets: 190 10*3/uL (ref 150–400)
RBC: 5.2 MIL/uL (ref 4.22–5.81)
RDW: 12.1 % (ref 11.5–15.5)
WBC: 6.5 10*3/uL (ref 4.0–10.5)

## 2014-10-19 LAB — DIFFERENTIAL
BASOS PCT: 0 % (ref 0–1)
Basophils Absolute: 0 10*3/uL (ref 0.0–0.1)
Eosinophils Absolute: 0.3 10*3/uL (ref 0.0–0.7)
Eosinophils Relative: 5 % (ref 0–5)
Lymphocytes Relative: 36 % (ref 12–46)
Lymphs Abs: 2.4 10*3/uL (ref 0.7–4.0)
Monocytes Absolute: 0.5 10*3/uL (ref 0.1–1.0)
Monocytes Relative: 7 % (ref 3–12)
NEUTROS ABS: 3.4 10*3/uL (ref 1.7–7.7)
NEUTROS PCT: 52 % (ref 43–77)

## 2014-10-19 LAB — I-STAT CHEM 8, ED
BUN: 8 mg/dL (ref 6–23)
Calcium, Ion: 1.14 mmol/L (ref 1.12–1.23)
Chloride: 101 mmol/L (ref 96–112)
Creatinine, Ser: 1.1 mg/dL (ref 0.50–1.35)
GLUCOSE: 84 mg/dL (ref 70–99)
HCT: 44 % (ref 39.0–52.0)
HEMOGLOBIN: 15 g/dL (ref 13.0–17.0)
Potassium: 3.5 mmol/L (ref 3.5–5.1)
SODIUM: 139 mmol/L (ref 135–145)
TCO2: 22 mmol/L (ref 0–100)

## 2014-10-19 LAB — CBG MONITORING, ED: GLUCOSE-CAPILLARY: 89 mg/dL (ref 70–99)

## 2014-10-19 LAB — I-STAT TROPONIN, ED: TROPONIN I, POC: 0 ng/mL (ref 0.00–0.08)

## 2014-10-19 LAB — PROTIME-INR
INR: 1.03 (ref 0.00–1.49)
Prothrombin Time: 13.6 seconds (ref 11.6–15.2)

## 2014-10-19 LAB — APTT: APTT: 34 s (ref 24–37)

## 2014-10-19 MED ORDER — METOCLOPRAMIDE HCL 5 MG/ML IJ SOLN
10.0000 mg | Freq: Once | INTRAMUSCULAR | Status: AC
  Administered 2014-10-19: 10 mg via INTRAVENOUS
  Filled 2014-10-19: qty 2

## 2014-10-19 MED ORDER — ASPIRIN EC 81 MG PO TBEC
81.0000 mg | DELAYED_RELEASE_TABLET | Freq: Every day | ORAL | Status: DC
  Filled 2014-10-19: qty 1

## 2014-10-19 MED ORDER — MORPHINE SULFATE 4 MG/ML IJ SOLN
4.0000 mg | Freq: Once | INTRAMUSCULAR | Status: AC
Start: 2014-10-19 — End: 2014-10-19
  Administered 2014-10-19: 4 mg via INTRAVENOUS
  Filled 2014-10-19: qty 1

## 2014-10-19 MED ORDER — MORPHINE SULFATE 4 MG/ML IJ SOLN
4.0000 mg | Freq: Once | INTRAMUSCULAR | Status: AC
  Administered 2014-10-19: 4 mg via INTRAVENOUS
  Filled 2014-10-19: qty 1

## 2014-10-19 MED ORDER — DIPHENHYDRAMINE HCL 50 MG/ML IJ SOLN
25.0000 mg | Freq: Once | INTRAMUSCULAR | Status: AC
  Administered 2014-10-19: 25 mg via INTRAVENOUS
  Filled 2014-10-19: qty 1

## 2014-10-19 NOTE — ED Provider Notes (Signed)
CSN: [id number]     Arrival date & time 10/19/14  2058 History   First MD Initiated Contact with Patient 10/19/14 2121     Chief Complaint  Patient presents with  . Code Stroke     (Consider location/radiation/quality/duration/timing/severity/associated sxs/prior Treatment) HPI  Pt presenting with c/o chest pain, headache and left upper extremity weakness. Pt states that symptoms began approx 30 minutes prior to arrival.  His primary complaint now is headache and arm weakness.  He states the headache is frontal and throbbing.  He states left upper extremity is numb/tingling and weak.  He has not had symptoms similar to this in the past.  No shortness of breath.  No changes in vision or speech.  No weakness of leg.  Code stroke was activated in triage upon patient arrival.  There are no other associated systemic symptoms, there are no other alleviating or modifying factors.   Past Medical History  Diagnosis Date  . COPD (chronic obstructive pulmonary disease)   . Bell's palsy   . IBS (irritable bowel syndrome)   . Hypertension   . Bipolar 1 disorder   . Schizophrenia   . Asthma    Past Surgical History  Procedure Laterality Date  . Cholecystectomy    . Abdominal surgery    . Incision and drainage Left 02/05/2013    Dr Rolena Infante  . Knee arthroscopy with medial menisectomy Left 02/05/2013    Procedure: KNEE ARTHROSCOPY  ;  Surgeon: Melina Schools, MD;  Location: [facility];  Service: Orthopedics;  Laterality: Left;  . Irrigation and debridement knee  02/05/2013    Procedure: IRRIGATION AND DEBRIDEMENT KNEE patella bursa;  Surgeon: Melina Schools, MD;  Location: [facility];  Service: Orthopedics;;   Family History  Problem Relation Age of Onset  . Hypertension Mother   . CAD Father     MI in his late 48s  . Stroke Father   . Diabetes Paternal Grandfather   . Pancreatic cancer Maternal Grandmother   . Prostate cancer Father   . Clotting disorder Maternal Grandfather   . Heart disease Maternal  Grandfather    History  Substance Use Topics  . Smoking status: Current Every Day Smoker -- 1.00 packs/day for 12 years    Types: Cigarettes  . Smokeless tobacco: Never Used  . Alcohol Use: No     Comment: occasionallty     Review of Systems  ROS reviewed and all otherwise negative except for mentioned in HPI    Allergies  Flagyl; Lithium; Penicillins; Phenergan; Vancomycin; and Latex  Home Medications   Prior to Admission medications   Medication Sig Start Date End Date Taking? Authorizing Provider  albuterol (PROVENTIL HFA;VENTOLIN HFA) 108 (90 BASE) MCG/ACT inhaler Inhale 2 puffs into the lungs every 6 (six) hours as needed for wheezing or shortness of breath. 03/25/14   Lorayne Marek, MD  busPIRone (BUSPAR) 10 MG tablet Take 10 mg by mouth 3 (three) times daily.    Historical Provider, MD  carbamazepine (TEGRETOL XR) 200 MG 12 hr tablet Take 400 mg by mouth 2 (two) times daily.     Historical Provider, MD  diphenhydrAMINE (BENADRYL) 25 MG tablet Take 2 tablets (50 mg total) by mouth every 4 (four) hours as needed for itching. 10/09/14   Comer Locket, PA-C  hydrocortisone cream 1 % Apply to affected area 2 times daily 10/09/14   Comer Locket, PA-C  lurasidone (LATUDA) 40 MG TABS tablet Take 40 mg by mouth every evening.    Historical  Provider, MD  omeprazole (PRILOSEC) 40 MG capsule Take 1 capsule (40 mg total) by mouth daily. 06/04/14   Irene Shipper, MD  ranitidine (ZANTAC) 150 MG tablet Take 1 tablet (150 mg total) by mouth 2 (two) times daily. 10/09/14   Comer Locket, PA-C  Skin Protectants, Misc. (EUCERIN) cream Apply topically as needed for wound care. 10/09/14   Comer Locket, PA-C  venlafaxine (EFFEXOR) 75 MG tablet Take 75 mg by mouth 3 (three) times daily with meals.    Historical Provider, MD   BP 114/66 mmHg  Pulse 75  Temp(Src) 98.3 F (36.8 C) (Oral)  Resp 18  SpO2 95%  Vitals reviewed Physical Exam  Physical Examination: General appearance - alert,  well appearing, and in no distress Mental status - alert, oriented to person, place, and time Eyes - pupils equal and reactive, extraocular eye movements intact Mouth - mucous membranes moist, pharynx normal without lesions Chest - clear to auscultation, no wheezes, rales or rhonchi, symmetric air entry Heart - normal rate, regular rhythm, normal S1, S2, no murmurs, rubs, clicks or gallops Abdomen - soft, nontender, nondistended, no masses or organomegaly Neurological - alert, oriented x 3, cranial nerves 2-12 tested and intact, sensation intact to light touch, + pronator drift on the left, strength 4/5 on left upper extremity Extremities - peripheral pulses normal, no pedal edema, no clubbing or cyanosis Skin - normal coloration and turgor, no rashes  ED Course  Procedures (including critical care time)  9:30 PM Dr. Armida Sans, neuorology is in with the patient assessing now.  Pt does have some left arm pronator drift.  C/o headache. Pt is NIH 2, does not qualify for TPA per Dr. Armida Sans.    11:38 PM d/w Dr. Arnoldo Morale for admission, pt to go telemetry obs bed.    CRITICAL CARE Performed by: Threasa Beards Total critical care time: 40 Critical care time was exclusive of separately billable procedures and treating other patients. Critical care was necessary to treat or prevent imminent or life-threatening deterioration. Critical care was time spent personally by me on the following activities: development of treatment plan with patient and/or surrogate as well as nursing, discussions with consultants, evaluation of patient's response to treatment, examination of patient, obtaining history from patient or surrogate, ordering and performing treatments and interventions, ordering and review of laboratory studies, ordering and review of radiographic studies, pulse oximetry and re-evaluation of patient's condition. Labs Review Labs Reviewed  COMPREHENSIVE METABOLIC PANEL - Abnormal; Notable for the  following:    Potassium 3.3 (*)    Calcium 8.2 (*)    All other components within normal limits  PROTIME-INR  APTT  CBC  DIFFERENTIAL  HEMOGLOBIN A1C  LIPID PANEL  D-DIMER, QUANTITATIVE  CBG MONITORING, ED  I-STAT CHEM 8, ED  I-STAT TROPOININ, ED    Imaging Review Dg Chest 2 View  10/19/2014   CLINICAL DATA:  Left-sided chest pain and weakness throughout the left arm today. Code stroke.  EXAM: CHEST  2 VIEW  COMPARISON:  09/29/2014  FINDINGS: Shallow inspiration. Normal heart size and pulmonary vascularity. No focal airspace disease or consolidation in the lungs. No blunting of costophrenic angles. No pneumothorax. Mediastinal contours appear intact.  IMPRESSION: No active cardiopulmonary disease.   Electronically Signed   By: Lucienne Capers M.D.   On: 10/19/2014 22:38   Ct Head (brain) Wo Contrast  10/19/2014   CLINICAL DATA:  Left-sided weakness and frontal headache. Code stroke.  EXAM: CT HEAD WITHOUT CONTRAST  TECHNIQUE: Contiguous axial  images were obtained from the base of the skull through the vertex without intravenous contrast.  COMPARISON:  03/14/2014  FINDINGS: Skull and Sinuses:Chronic posttraumatic deformity of the nasal arch. No acute fracture or aggressive lesion.  Orbits: No acute abnormality.  Brain: No evidence of acute infarction, hemorrhage, hydrocephalus, or mass lesion/mass effect. ASPECTS is 10. Dural ossification prominently along the left tentorium.  These results were called by telephone at the time of interpretation on 10/19/2014 at 9:19 pm to Dr. Alfonzo Beers , who verbally acknowledged these results.  Neuro hospitalist pager called on 10/19/2014 at 9:22 pm. Neurology communication is still pending.  IMPRESSION: Negative head CT.   Electronically Signed   By: Monte Fantasia M.D.   On: 10/19/2014 21:30     EKG Interpretation   Date/Time:  Sunday October 19 2014 21:02:01 EDT Ventricular Rate:  84 PR Interval:  138 QRS Duration: 94 QT Interval:  364 QTC  Calculation: 430 R Axis:   18 Text Interpretation:  Normal sinus rhythm Incomplete right bundle branch  block Nonspecific T wave abnormality Abnormal ECG No significant change  since last tracing Confirmed by [facility]  MD, Aliegha Paullin [phone]) on 10/19/2014  9:36:18 PM      MDM   Final diagnoses:  Headache, unspecified headache type  Upper extremity weakness  Chest pain, unspecified chest pain type    Pt presenting with c/o headache, left arm weakness.  Also chest pain.  Pt was activated as code stroke upon arrival. He was seen by Dr. Armida Sans, neurology- NIH scale of 2 so not a TPA candidate.  Neurology recommends admission to medicine for further stroke workup, symptoms may be due to complicated migraine.  Chest pain as well- normal pulses in left upper extremity, EKG and troponin negative.  CXR reassuring- no ptx or widened mediastinum.  Per chart review patient has had multiple ED evaluations for nonspecific chest pain.  Doubt ACS, PERC 0. D/w [facility] for admission for further workup.      Alfonzo Beers, MD 10/19/14 [id number]

## 2014-10-19 NOTE — ED Notes (Addendum)
Patient transported to CT 

## 2014-10-19 NOTE — ED Notes (Addendum)
Neurologist ( Dr. Murvin Natal ) advised nurse that pt. will not receive acute treatment or TPA due to mild and improving symptoms .

## 2014-10-19 NOTE — ED Notes (Signed)
Pt to CT

## 2014-10-19 NOTE — ED Notes (Signed)
Patient transported to X-ray 

## 2014-10-19 NOTE — Consult Note (Addendum)
Referring Physician: ED    Chief Complaint: chest pain, HA, left arm numbness-weakness  HPI:                                                                                                                                         Nathaniel [name] is an 34 y.o. male with a past medical history significant for HTN, obesity, smoking, bipolar disorder, schizophrenia, asthma, and Bell's palsy, initially brought in due to chest pain and HA subsequent development of left arm numbness-pain. Patient's wife is at the bedside and stated that her husband was driving and developed left chest pain radiating down to the left arm, then a severe HA and they decided to come to the ED. While in the waiting room started complaining of left arm numbness-heaviness. Denies associated vertigo, double vision, difficulty swallowing, imbalance, slurred speech, language or vision impairment.  NIHSS 2 (subtle drift left arm, impaired sensory left leg) CT brain was personally reviewed and showed no acute abnormality. EKG unimpressive.  Date last known well: 10/19/14 Time last known well: 8:30 pm tPA Given: no, minimal deficits NIHSS: 2 MRS: 0  Past Medical History  Diagnosis Date  . COPD (chronic obstructive pulmonary disease)   . Bell's palsy   . IBS (irritable bowel syndrome)   . Hypertension   . Bipolar 1 disorder   . Schizophrenia   . Asthma     Past Surgical History  Procedure Laterality Date  . Cholecystectomy    . Abdominal surgery    . Incision and drainage Left 02/05/2013    Dr Rolena Infante  . Knee arthroscopy with medial menisectomy Left 02/05/2013    Procedure: KNEE ARTHROSCOPY  ;  Surgeon: Melina Schools, MD;  Location: [facility];  Service: Orthopedics;  Laterality: Left;  . Irrigation and debridement knee  02/05/2013    Procedure: IRRIGATION AND DEBRIDEMENT KNEE patella bursa;  Surgeon: Melina Schools, MD;  Location: [facility];  Service: Orthopedics;;    Family History  Problem Relation Age of Onset  .  Hypertension Mother   . CAD Father     MI in his late 39s  . Stroke Father   . Diabetes Paternal Grandfather   . Pancreatic cancer Maternal Grandmother   . Prostate cancer Father   . Clotting disorder Maternal Grandfather   . Heart disease Maternal Grandfather    Social History:  reports that he has been smoking Cigarettes.  He has a 12 pack-year smoking history. He has never used smokeless tobacco. He reports that he does not drink alcohol or use illicit drugs. Family history: no brain tumors, brain aneurysms, or epilepsy Allergies:  Allergies  Allergen Reactions  . Flagyl [Metronidazole]     anaphylaxis  . Lithium Anaphylaxis  . Penicillins Anaphylaxis  . Phenergan [Promethazine Hcl]     seizures  . Vancomycin     Anaphylaxis   . Latex  rash    Medications:                                                                                                                           I have reviewed the patient's current medications.  ROS:                                                                                                                                       History obtained from the patient, wife, and chart review  General ROS: negative for - chills, fatigue, fever, night sweats, weight gain or weight loss Psychological ROS: negative for - behavioral disorder, hallucinations, memory difficulties, mood swings or suicidal ideation Ophthalmic ROS: negative for - blurry vision, double vision, eye pain or loss of vision ENT ROS: negative for - epistaxis, nasal discharge, oral lesions, sore throat, tinnitus or vertigo Allergy and Immunology ROS: negative for - hives or itchy/watery eyes Hematological and Lymphatic ROS: negative for - bleeding problems, bruising or swollen lymph nodes Endocrine ROS: negative for - galactorrhea, hair pattern changes, polydipsia/polyuria or temperature intolerance Respiratory ROS: negative for - cough, hemoptysis, shortness of breath or  wheezing Cardiovascular ROS: negative for - dyspnea on exertion, edema or irregular heartbeat Gastrointestinal ROS: negative for - abdominal pain, diarrhea, hematemesis, nausea/vomiting or stool incontinence Genito-Urinary ROS: negative for - dysuria, hematuria, incontinence or urinary frequency/urgency Musculoskeletal ROS: negative for - joint swelling Neurological ROS: as noted in HPI Dermatological ROS: negative for rash and skin lesion changes   Physical exam: pleasant male in mild distress due to chest pain. Blood pressure 139/91, pulse 83, temperature 97.9 F (36.6 C), temperature source Oral, resp. rate 22, SpO2 99 %. Head: normocephalic. Neck: supple, no bruits, no JVD. Cardiac: no murmurs. Lungs: clear. Abdomen: soft, no tender, no mass. Extremities: no edema. Skin: no edema Neurologic Examination:  General: Mental Status: Alert, oriented, thought content appropriate.  Speech fluent without evidence of aphasia.  Able to follow 3 step commands without difficulty. Cranial Nerves: II: Discs flat bilaterally; Visual fields grossly normal, pupils equal, round, reactive to light and accommodation III,IV, VI: ptosis not present, extra-ocular motions intact bilaterally V,VII: smile symmetric, facial light touch sensation normal bilaterally VIII: hearing normal bilaterally IX,X: uvula rises symmetrically XI: bilateral shoulder shrug XII: midline tongue extension without atrophy or fasciculations Motor: Significant for subtle drift left arm Tone and bulk:normal tone throughout; no atrophy noted Sensory: Pinprick and light touch decreased in the left leg Deep Tendon Reflexes:  Right: Upper Extremity   Left: Upper extremity   biceps (C-5 to C-6) 2/4   biceps (C-5 to C-6) 2/4 tricep (C7) 2/4    triceps (C7) 2/4 Brachioradialis (C6) 2/4  Brachioradialis (C6) 2/4  Lower Extremity  Lower Extremity  quadriceps (L-2 to L-4) 2/4   quadriceps (L-2 to L-4) 2/4 Achilles (S1) 2/4   Achilles (S1) 2/4  Plantars: Right: downgoing   Left: downgoing Cerebellar: normal finger-to-nose,  normal heel-to-shin test Gait:  No tested due to multiple leads, safety reasons.    Results for orders placed or performed during the hospital encounter of 10/19/14 (from the past 48 hour(s))  Protime-INR     Status: None   Collection Time: 10/19/14  9:19 PM  Result Value Ref Range   Prothrombin Time 13.6 11.6 - 15.2 seconds   INR 1.03 0.00 - 1.49  APTT     Status: None   Collection Time: 10/19/14  9:19 PM  Result Value Ref Range   aPTT 34 24 - 37 seconds  CBC     Status: None   Collection Time: 10/19/14  9:19 PM  Result Value Ref Range   WBC 6.5 4.0 - 10.5 K/uL   RBC 5.20 4.22 - 5.81 MIL/uL   Hemoglobin 15.9 13.0 - 17.0 g/dL   HCT 44.9 39.0 - 52.0 %   MCV 86.3 78.0 - 100.0 fL   MCH 30.6 26.0 - 34.0 pg   MCHC 35.4 30.0 - 36.0 g/dL   RDW 12.1 11.5 - 15.5 %   Platelets 190 150 - 400 K/uL  Differential     Status: None   Collection Time: 10/19/14  9:19 PM  Result Value Ref Range   Neutrophils Relative % 52 43 - 77 %   Neutro Abs 3.4 1.7 - 7.7 K/uL   Lymphocytes Relative 36 12 - 46 %   Lymphs Abs 2.4 0.7 - 4.0 K/uL   Monocytes Relative 7 3 - 12 %   Monocytes Absolute 0.5 0.1 - 1.0 K/uL   Eosinophils Relative 5 0 - 5 %   Eosinophils Absolute 0.3 0.0 - 0.7 K/uL   Basophils Relative 0 0 - 1 %   Basophils Absolute 0.0 0.0 - 0.1 K/uL  CBG monitoring, ED     Status: None   Collection Time: 10/19/14  9:20 PM  Result Value Ref Range   Glucose-Capillary 89 70 - 99 mg/dL  I-stat troponin, ED (not at [facility])     Status: None   Collection Time: 10/19/14  9:24 PM  Result Value Ref Range   Troponin i, poc 0.00 0.00 - 0.08 ng/mL   Comment 3            Comment: Due to the release kinetics of cTnI, a negative result within the first hours of the onset of symptoms does not rule  out myocardial infarction with  certainty. If myocardial infarction is still suspected, repeat the test at appropriate intervals.   I-Stat Chem 8, ED     Status: None   Collection Time: 10/19/14  9:26 PM  Result Value Ref Range   Sodium 139 135 - 145 mmol/L   Potassium 3.5 3.5 - 5.1 mmol/L   Chloride 101 96 - 112 mmol/L   BUN 8 6 - 23 mg/dL   Creatinine, Ser 1.10 0.50 - 1.35 mg/dL   Glucose, Bld 84 70 - 99 mg/dL   Calcium, Ion 1.14 1.12 - 1.23 mmol/L   TCO2 22 0 - 100 mmol/L   Hemoglobin 15.0 13.0 - 17.0 g/dL   HCT 44.0 39.0 - 52.0 %   Ct Head (brain) Wo Contrast  10/19/2014   CLINICAL DATA:  Left-sided weakness and frontal headache. Code stroke.  EXAM: CT HEAD WITHOUT CONTRAST  TECHNIQUE: Contiguous axial images were obtained from the base of the skull through the vertex without intravenous contrast.  COMPARISON:  03/14/2014  FINDINGS: Skull and Sinuses:Chronic posttraumatic deformity of the nasal arch. No acute fracture or aggressive lesion.  Orbits: No acute abnormality.  Brain: No evidence of acute infarction, hemorrhage, hydrocephalus, or mass lesion/mass effect. ASPECTS is 10. Dural ossification prominently along the left tentorium.  These results were called by telephone at the time of interpretation on 10/19/2014 at 9:19 pm to Dr. Alfonzo Beers , who verbally acknowledged these results.  Neuro hospitalist pager called on 10/19/2014 at 9:22 pm. Neurology communication is still pending.  IMPRESSION: Negative head CT.   Electronically Signed   By: Monte Fantasia M.D.   On: 10/19/2014 21:30     Assessment: 34 y.o. male presents with chest pain, HA, left arm numbness-weakness. NIHSS 2, CT brain without acute abnormality. Differential includes a small right brain infarct in the context of aortic dissection (EKG was negative and troponin and CTA chest pending) versus complicated migraine (no prior h/o HA). Patient within the window for IV thrombolysis but was not considered for treatment due to  very minimal deficits.  Patient will be admitted to medicine and complete stroke work up. Aspirin. Stroke team will resume care in the morning.  Stroke Risk Factors - HTN, obesity, smoking  Plan: 1. HgbA1c, fasting lipid panel 2. MRI, MRA  of the brain without contrast 3. Echocardiogram 4. Carotid dopplers 5. Prophylactic therapy-aspirin 6. Risk factor modification 7. Telemetry monitoring 8. Frequent neuro checks 9. PT/OT SLP  Dorian Pod, MD [facility] [phone]  10/19/2014, 9:42 PM

## 2014-10-19 NOTE — ED Notes (Addendum)
Onset 8:30pm pt driving, started having chest pain, radiating down left arm, then got pounding headache across forehead and left arm weakness.  Left arm drift.  Smile symmetrical.

## 2014-10-20 DIAGNOSIS — Z720 Tobacco use: Secondary | ICD-10-CM

## 2014-10-20 DIAGNOSIS — I639 Cerebral infarction, unspecified: Secondary | ICD-10-CM

## 2014-10-20 DIAGNOSIS — J449 Chronic obstructive pulmonary disease, unspecified: Secondary | ICD-10-CM | POA: Diagnosis not present

## 2014-10-20 DIAGNOSIS — R079 Chest pain, unspecified: Secondary | ICD-10-CM | POA: Insufficient documentation

## 2014-10-20 DIAGNOSIS — I10 Essential (primary) hypertension: Secondary | ICD-10-CM | POA: Diagnosis not present

## 2014-10-20 DIAGNOSIS — R2990 Unspecified symptoms and signs involving the nervous system: Secondary | ICD-10-CM | POA: Insufficient documentation

## 2014-10-20 DIAGNOSIS — G43909 Migraine, unspecified, not intractable, without status migrainosus: Secondary | ICD-10-CM | POA: Diagnosis not present

## 2014-10-20 DIAGNOSIS — G43109 Migraine with aura, not intractable, without status migrainosus: Secondary | ICD-10-CM | POA: Diagnosis present

## 2014-10-20 LAB — CBC
HCT: 45.2 % (ref 39.0–52.0)
HEMOGLOBIN: 15.7 g/dL (ref 13.0–17.0)
MCH: 30 pg (ref 26.0–34.0)
MCHC: 34.7 g/dL (ref 30.0–36.0)
MCV: 86.3 fL (ref 78.0–100.0)
Platelets: 221 10*3/uL (ref 150–400)
RBC: 5.24 MIL/uL (ref 4.22–5.81)
RDW: 12.2 % (ref 11.5–15.5)
WBC: 8.1 10*3/uL (ref 4.0–10.5)

## 2014-10-20 LAB — LIPID PANEL
Cholesterol: 227 mg/dL — ABNORMAL HIGH (ref 0–200)
HDL: 31 mg/dL — ABNORMAL LOW (ref >39)
LDL Cholesterol: 158 mg/dL — ABNORMAL HIGH (ref 0–99)
TRIGLYCERIDES: 192 mg/dL — AB (ref <150)
Total CHOL/HDL Ratio: 7.3 RATIO
VLDL: 38 mg/dL (ref 0–40)

## 2014-10-20 LAB — TROPONIN I
Troponin I: <0.03 ng/mL (ref <0.031)
Troponin I: <0.03 ng/mL (ref <0.031)
Troponin I: <0.03 ng/mL (ref <0.031)

## 2014-10-20 LAB — MAGNESIUM: MAGNESIUM: 2 mg/dL (ref 1.5–2.5)

## 2014-10-20 LAB — D-DIMER, QUANTITATIVE: D-Dimer, Quant: <0.27 ug/mL-FEU (ref 0.00–0.48)

## 2014-10-20 LAB — CREATININE, SERUM
CREATININE: 1.04 mg/dL (ref 0.50–1.35)
GFR calc non Af Amer: >90 mL/min (ref >90)

## 2014-10-20 MED ORDER — KETOROLAC TROMETHAMINE 30 MG/ML IJ SOLN
30.0000 mg | Freq: Once | INTRAMUSCULAR | Status: AC
  Administered 2014-10-20: 30 mg via INTRAVENOUS
  Filled 2014-10-20: qty 1

## 2014-10-20 MED ORDER — ENOXAPARIN SODIUM 40 MG/0.4ML ~~LOC~~ SOLN
40.0000 mg | SUBCUTANEOUS | Status: DC
Start: 2014-10-20 — End: 2014-10-21
  Administered 2014-10-20 – 2014-10-21 (×2): 40 mg via SUBCUTANEOUS
  Filled 2014-10-20 (×2): qty 0.4

## 2014-10-20 MED ORDER — HYDROMORPHONE HCL 1 MG/ML IJ SOLN
0.5000 mg | INTRAMUSCULAR | Status: DC | PRN
  Administered 2014-10-20 (×4): 1 mg via INTRAVENOUS
  Administered 2014-10-20: 0.5 mg via INTRAVENOUS
  Administered 2014-10-21 (×5): 1 mg via INTRAVENOUS
  Filled 2014-10-20 (×12): qty 1

## 2014-10-20 MED ORDER — MAGNESIUM SULFATE 2 GM/50ML IV SOLN
2.0000 g | Freq: Once | INTRAVENOUS | Status: AC
  Administered 2014-10-20: 2 g via INTRAVENOUS
  Filled 2014-10-20: qty 50

## 2014-10-20 MED ORDER — DIVALPROEX SODIUM 500 MG PO DR TAB
1000.0000 mg | DELAYED_RELEASE_TABLET | Freq: Every day | ORAL | Status: DC
  Administered 2014-10-20: 1000 mg via ORAL
  Filled 2014-10-20: qty 2

## 2014-10-20 MED ORDER — BUSPIRONE HCL 10 MG PO TABS
10.0000 mg | ORAL_TABLET | Freq: Three times a day (TID) | ORAL | Status: DC
  Filled 2014-10-20 (×3): qty 1

## 2014-10-20 MED ORDER — NICOTINE 14 MG/24HR TD PT24
14.0000 mg | MEDICATED_PATCH | TRANSDERMAL | Status: DC
  Administered 2014-10-20 – 2014-10-21 (×2): 14 mg via TRANSDERMAL
  Filled 2014-10-20 (×3): qty 1

## 2014-10-20 MED ORDER — ALBUTEROL SULFATE (2.5 MG/3ML) 0.083% IN NEBU: 2.5000 mg | INHALATION_SOLUTION | Freq: Four times a day (QID) | RESPIRATORY_TRACT | Status: DC | PRN

## 2014-10-20 MED ORDER — CARBAMAZEPINE ER 400 MG PO TB12
400.0000 mg | ORAL_TABLET | Freq: Two times a day (BID) | ORAL | Status: DC
  Filled 2014-10-20 (×3): qty 1

## 2014-10-20 MED ORDER — LURASIDONE HCL 40 MG PO TABS
40.0000 mg | ORAL_TABLET | Freq: Every evening | ORAL | Status: DC
  Filled 2014-10-20 (×2): qty 1

## 2014-10-20 MED ORDER — ALBUTEROL SULFATE HFA 108 (90 BASE) MCG/ACT IN AERS: 2.0000 | INHALATION_SPRAY | Freq: Four times a day (QID) | RESPIRATORY_TRACT | Status: DC | PRN

## 2014-10-20 MED ORDER — NITROGLYCERIN 0.4 MG SL SUBL
0.4000 mg | SUBLINGUAL_TABLET | SUBLINGUAL | Status: DC | PRN
  Administered 2014-10-20 – 2014-10-21 (×4): 0.4 mg via SUBLINGUAL
  Filled 2014-10-20 (×3): qty 1

## 2014-10-20 MED ORDER — NAPROXEN 250 MG PO TABS
500.0000 mg | ORAL_TABLET | Freq: Two times a day (BID) | ORAL | Status: DC
  Administered 2014-10-20 – 2014-10-21 (×2): 500 mg via ORAL
  Filled 2014-10-20 (×2): qty 2

## 2014-10-20 MED ORDER — POTASSIUM CHLORIDE CRYS ER 20 MEQ PO TBCR
40.0000 meq | EXTENDED_RELEASE_TABLET | Freq: Once | ORAL | Status: AC
  Administered 2014-10-20: 40 meq via ORAL
  Filled 2014-10-20: qty 2

## 2014-10-20 MED ORDER — SENNOSIDES-DOCUSATE SODIUM 8.6-50 MG PO TABS
1.0000 | ORAL_TABLET | Freq: Every evening | ORAL | Status: DC | PRN
Start: 2014-10-20 — End: 2014-10-21
  Filled 2014-10-20: qty 1

## 2014-10-20 MED ORDER — STROKE: EARLY STAGES OF RECOVERY BOOK
Freq: Once | Status: DC
  Filled 2014-10-20: qty 1

## 2014-10-20 MED ORDER — FAMOTIDINE 20 MG PO TABS
20.0000 mg | ORAL_TABLET | Freq: Two times a day (BID) | ORAL | Status: DC
  Administered 2014-10-20 – 2014-10-21 (×4): 20 mg via ORAL
  Filled 2014-10-20 (×4): qty 1

## 2014-10-20 NOTE — ED Notes (Signed)
Pt noted to have periods of sleep apnea. Placed on oxygen

## 2014-10-20 NOTE — H&P (Signed)
[facility] Admission History and Physical       Nathaniel [name] GYI:[id number] DOB: 10/13/1980 DOA: 10/19/2014  Referring physician: EDP PCP: Lorayne Marek, MD  Specialists:   Chief Complaint: Headache and Chest Pain  HPI: Nathaniel [name] is a 34 y.o. male with a history of COPD/Asthma, HTN, Bipolar, IBS who presents to the ED with complaints of sudden onset of severe headache with left sided numbness and tingling, along with subsequent chest pain.   He rates the headache pain at 10/10 and  describes it as throbbing. He has no history of Migraine headaches, but his mother has Migraines.   He describes the chest pain as heaviness in his chest.  He denies SOB, , but has had nausea, no vomiting and no diaphoresis.      Review of Systems:  Constitutional: No Weight Loss, No Weight Gain, Night Sweats, Fevers, Chills, Dizziness, Light Headedness, Fatigue, or Generalized Weakness HEENT:   +Headaches, Difficulty Swallowing,Tooth/Dental Problems,Sore Throat,  No Sneezing, Rhinitis, Ear Ache, Nasal Congestion, or Post Nasal Drip,  Cardio-vascular:  +Chest pain, Orthopnea, PND, Edema in Lower Extremities, Anasarca, Dizziness, Palpitations  Resp: No Dyspnea, No DOE, No Cough, No Hemoptysis, No Wheezing.    GI: No Heartburn, Indigestion, Abdominal Pain, Nausea, Vomiting, Diarrhea, Constipation, Hematemesis, Hematochezia, Melena, Change in Bowel Habits,  Loss of Appetite  GU: No Dysuria, No Change in Color of Urine, No Urgency or Urinary Frequency, No Flank pain.  Musculoskeletal: No Joint Pain or Swelling, No Decreased Range of Motion, No Back Pain.  Neurologic: No Syncope, No Seizures, +Left Sided Paresthesias, and Weakness, Vision Disturbance or Loss, No Diplopia, No Vertigo, No Difficulty Walking,  Skin: No Rash or Lesions. Psych: No Change in Mood or Affect, No Depression or Anxiety, No Memory loss, No Confusion, or Hallucinations   Past Medical History  Diagnosis Date  . COPD  (chronic obstructive pulmonary disease)   . Bell's palsy   . IBS (irritable bowel syndrome)   . Hypertension   . Bipolar 1 disorder   . Schizophrenia   . Asthma      Past Surgical History  Procedure Laterality Date  . Cholecystectomy    . Abdominal surgery    . Incision and drainage Left 02/05/2013    Dr Rolena Infante  . Knee arthroscopy with medial menisectomy Left 02/05/2013    Procedure: KNEE ARTHROSCOPY  ;  Surgeon: Melina Schools, MD;  Location: [facility];  Service: Orthopedics;  Laterality: Left;  . Irrigation and debridement knee  02/05/2013    Procedure: IRRIGATION AND DEBRIDEMENT KNEE patella bursa;  Surgeon: Melina Schools, MD;  Location: [facility];  Service: Orthopedics;;      Prior to Admission medications   Medication Sig Start Date End Date Taking? Authorizing Provider  albuterol (PROVENTIL HFA;VENTOLIN HFA) 108 (90 BASE) MCG/ACT inhaler Inhale 2 puffs into the lungs every 6 (six) hours as needed for wheezing or shortness of breath. 03/25/14   Lorayne Marek, MD  busPIRone (BUSPAR) 10 MG tablet Take 10 mg by mouth 3 (three) times daily.    Historical Provider, MD  carbamazepine (TEGRETOL XR) 200 MG 12 hr tablet Take 400 mg by mouth 2 (two) times daily.     Historical Provider, MD  diphenhydrAMINE (BENADRYL) 25 MG tablet Take 2 tablets (50 mg total) by mouth every 4 (four) hours as needed for itching. 10/09/14   Comer Locket, PA-C  hydrocortisone cream 1 % Apply to affected area 2 times daily 10/09/14   Comer Locket, PA-C  lurasidone (  LATUDA) 40 MG TABS tablet Take 40 mg by mouth every evening.    Historical Provider, MD  omeprazole (PRILOSEC) 40 MG capsule Take 1 capsule (40 mg total) by mouth daily. 06/04/14   Irene Shipper, MD  ranitidine (ZANTAC) 150 MG tablet Take 1 tablet (150 mg total) by mouth 2 (two) times daily. 10/09/14   Comer Locket, PA-C  Skin Protectants, Misc. (EUCERIN) cream Apply topically as needed for wound care. 10/09/14   Comer Locket, PA-C  venlafaxine  (EFFEXOR) 75 MG tablet Take 75 mg by mouth 3 (three) times daily with meals.    Historical Provider, MD     Allergies  Allergen Reactions  . Flagyl [Metronidazole]     anaphylaxis  . Lithium Anaphylaxis  . Penicillins Anaphylaxis  . Phenergan [Promethazine Hcl]     seizures  . Vancomycin     Anaphylaxis   . Latex     rash    Social History:  reports that he has been smoking Cigarettes.  He has a 12 pack-year smoking history. He has never used smokeless tobacco. He reports that he does not drink alcohol or use illicit drugs.    Family History  Problem Relation Age of Onset  . Hypertension Mother   . CAD Father     MI in his late 27s  . Stroke Father   . Diabetes Paternal Grandfather   . Pancreatic cancer Maternal Grandmother   . Prostate cancer Father   . Clotting disorder Maternal Grandfather   . Heart disease Maternal Grandfather        Physical Exam:  GEN:  Pleasant Morbidly Obese  34 y.o. Caucasian male examined and in discomfort but no acute distress; cooperative with exam Filed Vitals:   10/19/14 2245 10/19/14 2300 10/20/14 0025 10/20/14 0041  BP: 131/88 114/66 122/68 141/94  Pulse: 80 75 82 85  Temp:      TempSrc:      Resp: 20 18 16 25   SpO2: 97% 95% 99% 95%   Blood pressure 141/94, pulse 85, temperature 98.3 F (36.8 C), temperature source Oral, resp. rate 25, SpO2 95 %. PSYCH: He is alert and oriented x4; does not appear anxious does not appear depressed; affect is normal HEENT: Normocephalic and Atraumatic, Mucous membranes pink; PERRLA; EOM intact; Fundi:  Benign;  No scleral icterus, Nares: Patent, Oropharynx: Clear, Fair Dentition,    Neck:  FROM, No Cervical Lymphadenopathy nor Thyromegaly or Carotid Bruit; No JVD; Breasts:: Not examined CHEST WALL: No tenderness CHEST: Normal respiration, clear to auscultation bilaterally HEART: Regular rate and rhythm; no murmurs rubs or gallops BACK: No kyphosis or scoliosis; No CVA tenderness ABDOMEN:  Positive Bowel Sounds, Obese, Soft Non-Tender, No Rebound or Guarding; No Masses, No Organomegaly Rectal Exam: Not done EXTREMITIES: NoCyanosis, Clubbing, or Edema; No Ulcerations. Genitalia: not examined PULSES: 2+ and symmetric SKIN: Normal hydration no rash or ulceration  CNS:   Mental Status:  Alert, Oriented x 4, Thought Content Appropriate. Speech Fluent without evidence of Aphasia. Able to follow 3 step commands without difficulty.  In No obvious pain.   Cranial Nerves:  II: Discs flat bilaterally; Visual fields Intact, Pupils equal and reactive.    III,IV, VI: Extra-ocular motions intact bilaterally    V,VII: smile symmetric, facial light touch sensation normal bilaterally    VIII: hearing intact bilaterally    IX,X: gag reflex present    XI: bilateral shoulder shrug    XII: midline tongue extension   Motor:  Right:  Upper extremity  5/5     Left:  Upper extremity 5/5     Right:  Lower extremity 5/5    Left:  Lower extremity 5/5     Tone and Bulk:  normal tone throughout; no atrophy noted   Sensory:  Pinprick and light touch intact throughout, bilaterally   Deep Tendon Reflexes: 2+ and symmetric throughout   Plantars/ Babinski:  Right: normal Left: normal    Cerebellar:  Finger to nose without difficulty.   Gait: deferred   Vascular: pulses palpable throughout    Labs on Admission:  Basic Metabolic Panel:  Recent Labs Lab 10/19/14 2119 10/19/14 2126  NA 137 139  K 3.3* 3.5  CL 102 101  CO2 24  --   GLUCOSE 84 84  BUN 8 8  CREATININE 1.08 1.10  CALCIUM 8.2*  --    Liver Function Tests:  Recent Labs Lab 10/19/14 2119  AST 23  ALT 28  ALKPHOS 76  BILITOT 0.3  PROT 6.5  ALBUMIN 3.6   No results for input(s): LIPASE, AMYLASE in the last 168 hours. No results for input(s): AMMONIA in the last 168 hours. CBC:  Recent Labs Lab 10/19/14 2119 10/19/14 2126  WBC 6.5  --   NEUTROABS 3.4  --   HGB 15.9 15.0  HCT 44.9 44.0  MCV 86.3  --   PLT 190   --    Cardiac Enzymes: No results for input(s): CKTOTAL, CKMB, CKMBINDEX, TROPONINI in the last 168 hours.  BNP (last 3 results) No results for input(s): BNP in the last 8760 hours.  ProBNP (last 3 results)  Recent Labs  01/16/14 2041 03/12/14 1315  PROBNP 10.2 29.5    CBG:  Recent Labs Lab 10/19/14 2120  GLUCAP 89    Radiological Exams on Admission: Dg Chest 2 View  10/19/2014   CLINICAL DATA:  Left-sided chest pain and weakness throughout the left arm today. Code stroke.  EXAM: CHEST  2 VIEW  COMPARISON:  09/29/2014  FINDINGS: Shallow inspiration. Normal heart size and pulmonary vascularity. No focal airspace disease or consolidation in the lungs. No blunting of costophrenic angles. No pneumothorax. Mediastinal contours appear intact.  IMPRESSION: No active cardiopulmonary disease.   Electronically Signed   By: Lucienne Capers M.D.   On: 10/19/2014 22:38   Ct Head (brain) Wo Contrast  10/19/2014   CLINICAL DATA:  Left-sided weakness and frontal headache. Code stroke.  EXAM: CT HEAD WITHOUT CONTRAST  TECHNIQUE: Contiguous axial images were obtained from the base of the skull through the vertex without intravenous contrast.  COMPARISON:  03/14/2014  FINDINGS: Skull and Sinuses:Chronic posttraumatic deformity of the nasal arch. No acute fracture or aggressive lesion.  Orbits: No acute abnormality.  Brain: No evidence of acute infarction, hemorrhage, hydrocephalus, or mass lesion/mass effect. ASPECTS is 10. Dural ossification prominently along the left tentorium.  These results were called by telephone at the time of interpretation on 10/19/2014 at 9:19 pm to Dr. Alfonzo Beers , who verbally acknowledged these results.  Neuro hospitalist pager called on 10/19/2014 at 9:22 pm. Neurology communication is still pending.  IMPRESSION: Negative head CT.   Electronically Signed   By: Monte Fantasia M.D.   On: 10/19/2014 21:30   Mr Jodene Nam Head Wo Contrast  10/20/2014   CLINICAL DATA:  Chest pain,  headache and LEFT arm numbness/pain while driving today. History of hypertension, obesity, smoking, schizophrenia, Bell's palsy.  EXAM: MRI HEAD WITHOUT CONTRAST  MRA HEAD WITHOUT CONTRAST  TECHNIQUE: Multiplanar, multiecho pulse sequences  of the brain and surrounding structures were obtained without intravenous contrast. Angiographic images of the head were obtained using MRA technique without contrast.  COMPARISON:  CT of the head October 19, 2014  FINDINGS: MRI HEAD FINDINGS  The ventricles and sulci are normal for patient's age. No abnormal parenchymal signal, mass lesions, mass effect. No reduced diffusion to suggest acute ischemia. No susceptibility artifact to suggest hemorrhage. Susceptibility artifact along the LEFT cerebellar tentorium at the incisura corresponding to calcification.  No abnormal extra-axial fluid collections. No extra-axial masses though, contrast enhanced sequences would be more sensitive. Normal major intracranial vascular flow voids seen at the skull base.  Ocular globes and orbital contents are unremarkable though not tailored for evaluation. No abnormal sellar expansion. Visualized paranasal sinuses and mastoid air cells are well-aerated. No suspicious calvarial bone marrow signal. No abnormal sellar expansion. Craniocervical junction maintained.  MRA HEAD FINDINGS  Anterior circulation: Normal flow related enhancement of the included cervical, petrous, cavernous and supra clinoid internal carotid arteries. Patent anterior communicating artery. Normal flow related enhancement of the anterior and middle cerebral arteries, including more distal segments.  No large vessel occlusion, high-grade stenosis, abnormal luminal irregularity, aneurysm.  Posterior circulation: Codominant vertebral artery's. Basilar artery is patent, with normal flow related enhancement of the main branch vessels. Normal flow related enhancement of the posterior cerebral arteries.  No large vessel occlusion,  high-grade stenosis, abnormal luminal irregularity, aneurysm.  IMPRESSION: MRI HEAD: Normal noncontrast MRI of the head.  MRA HEAD: Normal noncontrast MRA of the head.   Electronically Signed   By: Elon Alas   On: 10/20/2014 00:34   Mr Brain Wo Contrast  10/20/2014   CLINICAL DATA:  Chest pain, headache and LEFT arm numbness/pain while driving today. History of hypertension, obesity, smoking, schizophrenia, Bell's palsy.  EXAM: MRI HEAD WITHOUT CONTRAST  MRA HEAD WITHOUT CONTRAST  TECHNIQUE: Multiplanar, multiecho pulse sequences of the brain and surrounding structures were obtained without intravenous contrast. Angiographic images of the head were obtained using MRA technique without contrast.  COMPARISON:  CT of the head October 19, 2014  FINDINGS: MRI HEAD FINDINGS  The ventricles and sulci are normal for patient's age. No abnormal parenchymal signal, mass lesions, mass effect. No reduced diffusion to suggest acute ischemia. No susceptibility artifact to suggest hemorrhage. Susceptibility artifact along the LEFT cerebellar tentorium at the incisura corresponding to calcification.  No abnormal extra-axial fluid collections. No extra-axial masses though, contrast enhanced sequences would be more sensitive. Normal major intracranial vascular flow voids seen at the skull base.  Ocular globes and orbital contents are unremarkable though not tailored for evaluation. No abnormal sellar expansion. Visualized paranasal sinuses and mastoid air cells are well-aerated. No suspicious calvarial bone marrow signal. No abnormal sellar expansion. Craniocervical junction maintained.  MRA HEAD FINDINGS  Anterior circulation: Normal flow related enhancement of the included cervical, petrous, cavernous and supra clinoid internal carotid arteries. Patent anterior communicating artery. Normal flow related enhancement of the anterior and middle cerebral arteries, including more distal segments.  No large vessel occlusion,  high-grade stenosis, abnormal luminal irregularity, aneurysm.  Posterior circulation: Codominant vertebral artery's. Basilar artery is patent, with normal flow related enhancement of the main branch vessels. Normal flow related enhancement of the posterior cerebral arteries.  No large vessel occlusion, high-grade stenosis, abnormal luminal irregularity, aneurysm.  IMPRESSION: MRI HEAD: Normal noncontrast MRI of the head.  MRA HEAD: Normal noncontrast MRA of the head.   Electronically Signed   By: Elon Alas   On:  10/20/2014 00:34     EKG: Independently reviewed. Normal Sinus Rhythm Rate = 84, Incomplete RBBB   Assessment/Plan:   34 y.o. male with  Principal Problem:   1.   Complicated migraine/Headache   Neuro consulted, and Seen by Dr Aram Beecham     CVA workup Initiated but Negative MRI and CT scans of Brain   Pain control with IV Dilaudid PRN      Active Problems:   2.   Chest pain   Telemetry Monitoring   Cycle Troponins   ASA     3.   COPD (chronic obstructive pulmonary disease)   stable     4.   HTN (hypertension)   Monitor BPs   IV Hydralazine PRN     5.   Bipolar disorder-   Continue Latuda Rx     6.   Tobacco abuse   Nicotine Patch daily     7.   DVT Prophylaxis   Lovenox          Code Status:     FULL CODE       Family Communication:   Wife at Bedside    Disposition Plan:  Observation Status        Time spent:  [place] [facility] Pager [phone]   If [place] Please Contact the Day Rounding Team MD for [facility]  If 7PM-7AM, Please Contact Night-Floor Coverage  [email or web address] Password [email or web address] 10/20/2014, 1:43 AM     ADDENDUM:   Patient was seen and examined on 10/20/2014

## 2014-10-20 NOTE — ED Notes (Signed)
resp therapy by to check on cpap and pt response

## 2014-10-20 NOTE — ED Notes (Signed)
PT AT BEDSIDE.
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
## 2014-10-20 NOTE — ED Notes (Signed)
resp therapy called for cpap. Patient agreeable to trying cpap to help him sleep

## 2014-10-20 NOTE — Progress Notes (Signed)
Nurse placed pt on CPAP. Pt is tolerating well at this time. RT will continue to monitor.

## 2014-10-20 NOTE — ED Notes (Signed)
NEURO AT BEDSIDE

## 2014-10-20 NOTE — Progress Notes (Signed)
STROKE TEAM PROGRESS NOTE   HISTORY Nathaniel [name] is an 34 y.o. male with a past medical history significant for HTN, obesity, smoking, bipolar disorder, schizophrenia, asthma, and Bell's palsy, initially brought in due to chest pain and HA subsequent development of left arm numbness-pain. Patient's wife is at the bedside and stated that her husband was driving and developed left chest pain radiating down to the left arm, then a severe HA and they decided to come to the ED. While in the waiting room started complaining of left arm numbness-heaviness (LKW 10/19/2014 at 2030). Denies associated vertigo, double vision, difficulty swallowing, imbalance, slurred speech, language or vision impairment.  NIHSS 2 (subtle drift left arm, impaired sensory left leg) CT brain was personally reviewed and showed no acute abnormality. EKG unimpressive. NIHSS: 2 MRS: 0 Patient was not administered TPA secondary to minimal deficits. He is to be admitted for further evaluation and treatment.   SUBJECTIVE (INTERVAL HISTORY) His wife is at the bedside.  Overall he feels his condition is rapidly improving. He states he had a severe headache at chest pain onset. He describes it as a migraine though he has not hx of migraines. He states his head still hurts and it throbbing.    OBJECTIVE Temp:  [97.9 F (36.6 C)-98.3 F (36.8 C)] 98.3 F (36.8 C) (04/03 2206) Pulse Rate:  [73-93] 76 (04/04 1000) Cardiac Rhythm:  [-] Normal sinus rhythm (04/04 0801) Resp:  [9-32] 13 (04/04 1000) BP: (106-149)/(59-107) 124/73 mmHg (04/04 1000) SpO2:  [89 %-100 %] 96 % (04/04 1000)   Recent Labs Lab 10/19/14 2120  GLUCAP 89    Recent Labs Lab 10/19/14 2119 10/19/14 2126 10/20/14 0140 10/20/14 0256  NA 137 139  --   --   K 3.3* 3.5  --   --   CL 102 101  --   --   CO2 24  --   --   --   GLUCOSE 84 84  --   --   BUN 8 8  --   --   CREATININE 1.08 1.10  --  1.04  CALCIUM 8.2*  --   --   --   MG  --   --  2.0  --      Recent Labs Lab 10/19/14 2119  AST 23  ALT 28  ALKPHOS 76  BILITOT 0.3  PROT 6.5  ALBUMIN 3.6    Recent Labs Lab 10/19/14 2119 10/19/14 2126 10/20/14 0256  WBC 6.5  --  8.1  NEUTROABS 3.4  --   --   HGB 15.9 15.0 15.7  HCT 44.9 44.0 45.2  MCV 86.3  --  86.3  PLT 190  --  221    Recent Labs Lab 10/20/14 0140 10/20/14 0755  TROPONINI <0.03 <0.03    Recent Labs  10/19/14 2119  LABPROT 13.6  INR 1.03   No results for input(s): COLORURINE, LABSPEC, PHURINE, GLUCOSEU, HGBUR, BILIRUBINUR, KETONESUR, PROTEINUR, UROBILINOGEN, NITRITE, LEUKOCYTESUR in the last 72 hours.  Invalid input(s): APPERANCEUR     Component Value Date/Time   CHOL 227* 10/19/2014 2119   TRIG 192* 10/19/2014 2119   HDL 31* 10/19/2014 2119   CHOLHDL 7.3 10/19/2014 2119   VLDL 38 10/19/2014 2119   LDLCALC 158* 10/19/2014 2119   Lab Results  Component Value Date   HGBA1C 5.2 02/06/2013      Component Value Date/Time   LABOPIA POSITIVE* 01/17/2014 0234   COCAINSCRNUR NONE DETECTED 01/17/2014 0234   LABBENZ NONE DETECTED  01/17/2014 0234   AMPHETMU NONE DETECTED 01/17/2014 0234   THCU NONE DETECTED 01/17/2014 0234   LABBARB NONE DETECTED 01/17/2014 0234    No results for input(s): ETH in the last 168 hours.   Dg Chest 2 View 10/19/2014    No active cardiopulmonary disease.     Ct Head (brain) Wo Contrast 10/19/2014    Negative head CT.     MRI HEAD 10/20/2014   Normal noncontrast MRI of the head.    MRA HEAD 10/20/2014   Normal noncontrast MRA of the head.      PHYSICAL EXAM Obese young Caucasian male not in distress. . Afebrile. Head is nontraumatic. Neck is supple without bruit.    Cardiac exam no murmur or gallop. Lungs are clear to auscultation. Distal pulses are well felt. Neurological Exam ;  Awake  Alert oriented x 3. Normal speech and language.eye movements full without nystagmus.fundi were not visualized. Vision acuity and fields appear normal. Hearing is normal. Palatal  movements are normal. Face symmetric. Tongue midline. Normal strength, tone, reflexes and coordination. Subjective diminished left hemibody sensation with splitting of the midline as well as vibration over the forehead.. Gait deferred. ASSESSMENT/PLAN Mr. Nathaniel [name] is a 34 y.o. male with history of HTN, obesity, smoking, bipolar disorder, schizophrenia, asthma, and Bell's palsy, to the ED for chest pain and HA with subsequent development of left arm numbness-pain in the waiting room. He did not receive IV t-PA due to minimal symptoms.   Headache, possibly complicated migraine. No stroke/doubt TIA.  Resultant  Non-organic sensory exam. LUE arm with decreased sensation, HA  MRI  No acute stroke  MRA  Unremarkable   Carotid Doppler  pending   2D Echo  pending   HgbA1c pending  Lovenox 40 mg sq daily for VTE prophylaxis Diet Heart Room service appropriate?: Yes; Fluid consistency:: Thin  no antithrombotic prior to admission, now on aspirin 81 mg orally every day  Complete stroke workup  Will give toradol 30 mg x 1 for headache management  Therapy recommendations:  pending   Disposition:  Anticipate return home once workup completed   Hyperlipidemia  Home meds:  No statin  LDL 158  Other Stroke Risk Factors  Cigarette smoker, advised to stop smoking  Obesity, There is no weight on file to calculate BMI.   Family hx stroke (father)  Other Active Problems  Chest pain  Sleep apnea, unable to get testing d/t lack of insurance coverage, placed on CPAP. Needs OP evaluation.  Other Pertinent History  Bipolar/schizophrenia   COPD  Bell's Palsy  IBS  asthma  Hospital day # 0  Radene Journey [facility] See Amion for Pager information 10/20/2014 1:51 PM  I have personally examined this patient, reviewed notes, independently viewed imaging studies, participated in medical decision making and plan of care. I have made any additions or clarifications  directly to the above note. Agree with note above.  He has presented with headache and transient left-sided weakness and numbness in stroke like symptoms. Imaging study is negative. He probably has complicated migraine. He remains at risk for recurrent strokes/TIAs and neurological worsening and needs ongoing stroke evaluation and risk stratification. Start aspirin 81 mg daily.  Antony Contras, MD Medical Director [facility] Pager: [phone] 10/20/2014 5:07 PM    To contact Stroke Continuity provider, please refer to [email or web address] After hours, contact General Neurology

## 2014-10-20 NOTE — Progress Notes (Signed)
6:13 PM I agree with HPI/GPe and A/P per Dr. Arnoldo Morale  34 year old ?, Currently unemployed male, HTN, Morbid obesity, There is no weight on file to calculate BMI., bipolar taking latuda, Tegretol, Effexor,/ COPD, history of Bell's palsy, prior coronary CTA 2012 negative/nuclear study negative 01/2014, chronic chest pain according to outpatient office visit notes on Naprosyn, follows for? IBS -has seen multiple people in [place] in [facility] for GI evaluations, complicated GB surgery [facility] in 2009 S sign biliary leak requiring ERCP plus stent  Presented to [facility] after 3-4 days history of excessive daytime somnolence, "passing out"-wife first noticed this on 3/31 patient was driving. Asian had to pull over to the side of the road lays head on the steering wheel and was unarousable for couple of seconds. No gross general tonic clonic activity no tongue biting no urinary incontinence or fecal incontinence. He had another similar episode which was short lasting on Saturday and finally when he started to have chest pain on 10/19/14 he decided to come to the emergency room.   So far his troponins have been negative 2, MRI performed = normal noncontrast MRI of head, carotids are bilaterally 49-50% and blood work is unimpressive for significant abnormalities   HEENT obese flat affect, thick neck, Mallampati 3 CHEST clear no added sound CARDIAC S1-S2 sinus rhythm  On gross neurological exam power 5/5 moving all 4 limbs equally smile symmetric wrinkles forehead buccinator works appropriately, gait not assessed, cerebellar signs not assessed  Patient Active Problem List   Diagnosis Date Noted  . Complicated migraine 75/04/2584  . Pain in the chest   . Stroke-like episode   . Headache 10/19/2014  . Wheezing 03/25/2014  . Bipolar disorder 03/25/2014  . Nausea with vomiting 03/25/2014  . Precordial pain 03/12/2014  . Chest pain 01/17/2014  . COPD (chronic obstructive pulmonary disease)  01/17/2014  . COPD exacerbation 02/06/2013  . Septic prepatellar bursitis of left knee 02/05/2013  . HTN (hypertension) 02/05/2013  . Hyperglycemia 02/05/2013  . Tobacco abuse 07/19/2012  Very atypical presentation may be consistent with complicated migraine?-Given IV Toradol by neurology. I suspect he also has significant obstructive sleep apnea-wife states that he awakens at night while sleeping and sleeps a lot during the day and gets better sleep during the day. We will get a nighttime pulse ox which may qualify him for CPAP as an outpatient-patient's wife says he has already slept really well with CPAP machine ordered by neurology today. I expect that if the rest of patient's workup is negative patient can be discharged early in the a.m.   Verneita Griffes, MD [facility] [phone]

## 2014-10-20 NOTE — Progress Notes (Signed)
RN called and stated pt having periods of apnea and wanted to try a cpap with pt. Pt scheduled to have sleep study in the past but did not have insurance at the time so he cancelled it. Pt will need one upon discharge. RT noted several periods of apnea at bedside with snoring present. Pt very sleepy and having difficulty staying awake. Pt placed on auto cpap at this time for sleep, with no O2 bled in. Rn aware to watch spo2 and will let RT know if O2 is needed at a later time. RT will continue to monitor.

## 2014-10-20 NOTE — ED Notes (Signed)
Page placed to admitting doctor

## 2014-10-20 NOTE — ED Notes (Signed)
Requested Hospital bed 

## 2014-10-20 NOTE — Progress Notes (Signed)
*  PRELIMINARY RESULTS* Vascular Ultrasound Carotid Duplex (Doppler) has been completed.   Preliminary findings: Findings suggest 40-59% stenosis bilaterally.  Vertebral arteries are patent  with antegrade flow bilaterally.  Nathaniel [name] 10/20/2014, 5:42 PM

## 2014-10-21 DIAGNOSIS — G43909 Migraine, unspecified, not intractable, without status migrainosus: Secondary | ICD-10-CM | POA: Diagnosis not present

## 2014-10-21 DIAGNOSIS — J449 Chronic obstructive pulmonary disease, unspecified: Secondary | ICD-10-CM | POA: Diagnosis not present

## 2014-10-21 DIAGNOSIS — I639 Cerebral infarction, unspecified: Secondary | ICD-10-CM | POA: Insufficient documentation

## 2014-10-21 LAB — TSH: TSH: 1.378 u[IU]/mL (ref 0.350–4.500)

## 2014-10-21 LAB — HEMOGLOBIN A1C
Hgb A1c MFr Bld: 5.3 % (ref 4.8–5.6)
MEAN PLASMA GLUCOSE: 105 mg/dL

## 2014-10-21 LAB — MRSA PCR SCREENING: MRSA BY PCR: NEGATIVE

## 2014-10-21 MED ORDER — ASPIRIN 81 MG PO CHEW: 81.0000 mg | CHEWABLE_TABLET | Freq: Every day | ORAL | Status: DC

## 2014-10-21 MED ORDER — ACETAMINOPHEN 325 MG PO TABS: 650.0000 mg | ORAL_TABLET | Freq: Four times a day (QID) | ORAL | Status: DC | PRN

## 2014-10-21 MED ORDER — LURASIDONE HCL 40 MG PO TABS: 40.0000 mg | ORAL_TABLET | Freq: Every evening | ORAL | Status: DC

## 2014-10-21 NOTE — Progress Notes (Signed)
  Echocardiogram 2D Echocardiogram has been performed.  Nathaniel [name] 10/21/2014, 11:12 AM

## 2014-10-21 NOTE — Progress Notes (Signed)
STROKE TEAM PROGRESS NOTE   HISTORY Nathaniel [name] is an 34 y.o. male with a past medical history significant for HTN, obesity, smoking, bipolar disorder, schizophrenia, asthma, and Bell's palsy, initially brought in due to chest pain and HA subsequent development of left arm numbness-pain. Patient's wife is at the bedside and stated that her husband was driving and developed left chest pain radiating down to the left arm, then a severe HA and they decided to come to the ED. While in the waiting room started complaining of left arm numbness-heaviness (LKW 10/19/2014 at 2030). Denies associated vertigo, double vision, difficulty swallowing, imbalance, slurred speech, language or vision impairment.  NIHSS 2 (subtle drift left arm, impaired sensory left leg) CT brain was personally reviewed and showed no acute abnormality. EKG unimpressive. NIHSS: 2 MRS: 0 Patient was not administered TPA secondary to minimal deficits. He is to be admitted for further evaluation and treatment.   SUBJECTIVE (INTERVAL HISTORY) His wife is at the bedside.  Overall he feels his condition is rapidly improving. He states he has a moderate headache  But medicines do help. Carotid dopplers show bilateral mild stenosis and echo is pending   OBJECTIVE Temp:  [97.3 F (36.3 C)-98.1 F (36.7 C)] 97.3 F (36.3 C) (04/05 0405) Pulse Rate:  [73-92] 78 (04/05 0405) Cardiac Rhythm:  [-] Normal sinus rhythm (04/05 0800) Resp:  [14-80] 20 (04/05 0045) BP: (107-140)/(69-117) 118/69 mmHg (04/05 0405) SpO2:  [95 %-98 %] 96 % (04/05 0045) Weight:  [264 lb 14.4 oz (120.158 kg)-265 lb 14.4 oz (120.611 kg)] 264 lb 14.4 oz (120.158 kg) (04/05 0458)   Recent Labs Lab 10/19/14 2120  GLUCAP 89    Recent Labs Lab 10/19/14 2119 10/19/14 2126 10/20/14 0140 10/20/14 0256  NA 137 139  --   --   K 3.3* 3.5  --   --   CL 102 101  --   --   CO2 24  --   --   --   GLUCOSE 84 84  --   --   BUN 8 8  --   --   CREATININE 1.08 1.10  --   1.04  CALCIUM 8.2*  --   --   --   MG  --   --  2.0  --     Recent Labs Lab 10/19/14 2119  AST 23  ALT 28  ALKPHOS 76  BILITOT 0.3  PROT 6.5  ALBUMIN 3.6    Recent Labs Lab 10/19/14 2119 10/19/14 2126 10/20/14 0256  WBC 6.5  --  8.1  NEUTROABS 3.4  --   --   HGB 15.9 15.0 15.7  HCT 44.9 44.0 45.2  MCV 86.3  --  86.3  PLT 190  --  221    Recent Labs Lab 10/20/14 0140 10/20/14 0755 10/20/14 1350  TROPONINI <0.03 <0.03 <0.03    Recent Labs  10/19/14 2119  LABPROT 13.6  INR 1.03   No results for input(s): COLORURINE, LABSPEC, PHURINE, GLUCOSEU, HGBUR, BILIRUBINUR, KETONESUR, PROTEINUR, UROBILINOGEN, NITRITE, LEUKOCYTESUR in the last 72 hours.  Invalid input(s): APPERANCEUR     Component Value Date/Time   CHOL 227* 10/19/2014 2119   TRIG 192* 10/19/2014 2119   HDL 31* 10/19/2014 2119   CHOLHDL 7.3 10/19/2014 2119   VLDL 38 10/19/2014 2119   LDLCALC 158* 10/19/2014 2119   Lab Results  Component Value Date   HGBA1C 5.3 10/19/2014      Component Value Date/Time   LABOPIA POSITIVE* 01/17/2014 0234  COCAINSCRNUR NONE DETECTED 01/17/2014 0234   LABBENZ NONE DETECTED 01/17/2014 0234   AMPHETMU NONE DETECTED 01/17/2014 0234   THCU NONE DETECTED 01/17/2014 0234   LABBARB NONE DETECTED 01/17/2014 0234    No results for input(s): ETH in the last 168 hours.   Dg Chest 2 View 10/19/2014    No active cardiopulmonary disease.     Ct Head (brain) Wo Contrast 10/19/2014    Negative head CT.     MRI HEAD 10/20/2014   Normal noncontrast MRI of the head.    MRA HEAD 10/20/2014   Normal noncontrast MRA of the head.      PHYSICAL EXAM Obese young Caucasian male not in distress. . Afebrile. Head is nontraumatic. Neck is supple without bruit.    Cardiac exam no murmur or gallop. Lungs are clear to auscultation. Distal pulses are well felt. Neurological Exam ;  Awake  Alert oriented x 3. Normal speech and language.eye movements full without nystagmus.fundi were not  visualized. Vision acuity and fields appear normal. Hearing is normal. Palatal movements are normal. Face symmetric. Tongue midline. Normal strength, tone, reflexes and coordination. Subjective diminished left hemibody sensation with splitting of the midline as well as vibration over the forehead.. Gait deferred. ASSESSMENT/PLAN Nathaniel [name] is a 34 y.o. male with history of HTN, obesity, smoking, bipolar disorder, schizophrenia, asthma, and Bell's palsy, to the ED for chest pain and HA with subsequent development of left arm numbness-pain in the waiting room. He did not receive IV t-PA due to minimal symptoms.   Headache, possibly complicated migraine. No stroke/doubt TIA.  Resultant  Non-organic sensory exam. LUE arm with decreased sensation, HA  MRI  No acute stroke  MRA  Unremarkable  Carotid Doppler  40-59% stenosis bilaterally. Vertebral arteries are patent with antegrade flow bilaterally.    2D Echo  pending   HgbA1c 5.3  Lovenox 40 mg sq daily for VTE prophylaxis Diet NPO time specified  no antithrombotic prior to admission, now on aspirin 81 mg orally every day   Therapy recommendations:  pending   Disposition:  Anticipate return home once workup completed   Hyperlipidemia  Home meds:  No statin  LDL 158  Other Stroke Risk Factors  Cigarette smoker, advised to stop smoking  Obesity, Body mass index is 34.96 kg/(m^2).   Family hx stroke (father)  Other Active Problems  Chest pain  Sleep apnea, unable to get testing d/t lack of insurance coverage, placed on CPAP. Needs OP evaluation.  Other Pertinent History  Bipolar/schizophrenia   COPD  Bell's Palsy  IBS  asthma  Hospital day # [place] for Pager information 10/21/2014 10:34 AM  I have personally examined this patient, reviewed notes, independently viewed imaging studies, participated in medical decision making and plan of care. . Start  aspirin 81 mg daily. Dc home after echo results. D/w Dr Verlon Au  Antony Contras, MD Medical Director [place] Pager: [phone] 10/21/2014 10:34 AM    To contact Stroke Continuity provider, please refer to [email or web address] After hours, contact General Neurology

## 2014-10-21 NOTE — Discharge Summary (Signed)
Physician Discharge Summary  Nathaniel [name] QIH:[id number] DOB: November 10, 1980 DOA: 10/19/2014  PCP: Lorayne Marek, MD  Admit date: 10/19/2014 Discharge date: 10/21/2014  Time spent: 20 minutes  Recommendations for Outpatient Follow-up:  1. Needs OP sleep study-this is being arranged  2. Neurology to follow up if prn for Migraines-suspect this is related/worsened by OSA 3. Recommend outpatient progressive weight loss strategies, might fulfill bariatric referral guidelines? 4. Recommend continuation aspirin 81 mg for secondary prevention ASCVD given habitus 5. Consider outpatient evaluation of thyroid nodules found on carotid Doppler-of note TSH was within normal range. May consider as an outpatient antithyroid antibodies anti-TPO etc. etc.  Discharge Diagnoses:  Principal Problem:   Complicated migraine Active Problems:   Tobacco abuse   HTN (hypertension)   Chest pain   COPD (chronic obstructive pulmonary disease)   Bipolar disorder   Headache   Discharge Condition: Good  Diet recommendation: Regular  Filed Weights   10/20/14 1829 10/21/14 0458  Weight: 120.611 kg (265 lb 14.4 oz) 120.158 kg (264 lb 14.4 oz)    History of present illness:  34 year old ?, Currently unemployed male, HTN, Body mass index is 34.96 kg/(m^2).  bipolar taking latuda, Tegretol, Effexor,/ COPD, history of Bell's palsy, prior coronary CTA 2012 negative/nuclear study negative 01/2014, chronic chest pain according to outpatient office visit notes on Naprosyn, follows for? IBS-has seen multiple people in [place] in [facility] for GI evaluations, complicated GB surgery [facility] in 2009 S sign biliary leak requiring ERCP plus stent [facility]ed to [facility] after 3-4 days history of excessive daytime somnolence, "passing out"-wife first noticed this on3/31 patient was driving. Asian had to pull over to the side of the road lays head on the steering wheel and was unarousable for couple of seconds. No gross general  tonic clonic activity no tongue biting no urinary incontinence or fecal incontinence. He had another similar episode which was short lasting on Saturday and finally when he started to have chest pain on 10/19/14 he decided to come to the emergency room.  So far his troponins have been negative 2, MRI performed = normal noncontrast MRI of head, carotids are bilaterally 49-50% and blood work is unimpressive for significant abnormalities Echocardiogram ruled out any wall motion abnormality  Of note his carotids showed bilateral masses in his thyroid but TSH was within normal range and was 1.37.  The patientwas felt to have met clinical and metabolic parameters for safe discharge on 10/21/14 and will be discharged HOm  Procedures:  Multiple  Consultations:  Neurology  Discharge Exam: Filed Vitals:   10/21/14 0405  BP: 118/69  Pulse: 78  Temp: 97.3 F (36.3 C)  Resp:     General: EOMI NCAT Cardiovascular: S1-S2 no murmur rub or gallop Respiratory: Clinically clear  Discharge Instructions   Discharge Instructions    Diet - low sodium heart healthy    Complete by:  As directed      Discharge instructions    Complete by:  As directed   You did not have a stroke and have not had Cardiac Chest pain I would suggest conitnueing your Naprocen for headaches-I think that getting a sleep study and a sleep test and using a CPAP machine will go far in helping you     Increase activity slowly    Complete by:  As directed           Current Discharge Medication List    START taking these medications   Details  aspirin 81 MG chewable  tablet Chew 1 tablet (81 mg total) by mouth daily. Qty: 30 tablet, Refills: 0      CONTINUE these medications which have CHANGED   Details  lurasidone (LATUDA) 40 MG TABS tablet Take 1 tablet (40 mg total) by mouth every evening. Qty: 30 tablet, Refills: 0      CONTINUE these medications which have NOT CHANGED   Details  albuterol  (PROVENTIL HFA;VENTOLIN HFA) 108 (90 BASE) MCG/ACT inhaler Inhale 2 puffs into the lungs every 6 (six) hours as needed for wheezing or shortness of breath. Qty: 1 Inhaler, Refills: 2   Associated Diagnoses: Wheezing    divalproex (DEPAKOTE) 500 MG DR tablet Take 1,000 mg by mouth at bedtime.    omeprazole (PRILOSEC) 40 MG capsule Take 1 capsule (40 mg total) by mouth daily. Qty: 30 capsule, Refills: 11    venlafaxine (EFFEXOR) 75 MG tablet Take 75 mg by mouth 3 (three) times daily with meals.      STOP taking these medications     busPIRone (BUSPAR) 10 MG tablet      ranitidine (ZANTAC) 150 MG tablet        Allergies  Allergen Reactions  . Flagyl [Metronidazole]     anaphylaxis  . Lithium Anaphylaxis  . Penicillins Anaphylaxis  . Phenergan [Promethazine Hcl]     seizures  . Vancomycin     Anaphylaxis   . Latex     rash   Follow-up Information    Follow up with [facility], MD On 11/04/2014.   Specialty:  Pulmonary Disease   Why:  pulmonary f/u @ 9:00am    Contact information:   [place] [address] [phone]       Follow up with [facility] On 10/28/2014.   Why:  Sleep Study @ 8:00 pm.    Contact information:   [address], [place] [phone]       The results of significant diagnostics from this hospitalization (including imaging, microbiology, ancillary and laboratory) are listed below for reference.    Significant Diagnostic Studies: Dg Chest 2 View  10/19/2014   CLINICAL DATA:  Left-sided chest pain and weakness throughout the left arm today. Code stroke.  EXAM: CHEST  2 VIEW  COMPARISON:  09/29/2014  FINDINGS: Shallow inspiration. Normal heart size and pulmonary vascularity. No focal airspace disease or consolidation in the lungs. No blunting of costophrenic angles. No pneumothorax. Mediastinal contours appear intact.  IMPRESSION: No active cardiopulmonary disease.    Electronically Signed   By: Lucienne Capers M.D.   On: 10/19/2014 22:38   Ct Head (brain) Wo Contrast  10/19/2014   CLINICAL DATA:  Left-sided weakness and frontal headache. Code stroke.  EXAM: CT HEAD WITHOUT CONTRAST  TECHNIQUE: Contiguous axial images were obtained from the base of the skull through the vertex without intravenous contrast.  COMPARISON:  03/14/2014  FINDINGS: Skull and Sinuses:Chronic posttraumatic deformity of the nasal arch. No acute fracture or aggressive lesion.  Orbits: No acute abnormality.  Brain: No evidence of acute infarction, hemorrhage, hydrocephalus, or mass lesion/mass effect. ASPECTS is 10. Dural ossification prominently along the left tentorium.  These results were called by telephone at the time of interpretation on 10/19/2014 at 9:19 pm to Dr. Alfonzo Beers , who verbally acknowledged these results.  Neuro hospitalist pager called on 10/19/2014 at 9:22 pm. Neurology communication is still pending.  IMPRESSION: Negative head CT.   Electronically Signed   By: Monte Fantasia  M.D.   On: 10/19/2014 21:30   Mr Jodene Nam Head Wo Contrast  10/20/2014   CLINICAL DATA:  Chest pain, headache and LEFT arm numbness/pain while driving today. History of hypertension, obesity, smoking, schizophrenia, Bell's palsy.  EXAM: MRI HEAD WITHOUT CONTRAST  MRA HEAD WITHOUT CONTRAST  TECHNIQUE: Multiplanar, multiecho pulse sequences of the brain and surrounding structures were obtained without intravenous contrast. Angiographic images of the head were obtained using MRA technique without contrast.  COMPARISON:  CT of the head October 19, 2014  FINDINGS: MRI HEAD FINDINGS  The ventricles and sulci are normal for patient's age. No abnormal parenchymal signal, mass lesions, mass effect. No reduced diffusion to suggest acute ischemia. No susceptibility artifact to suggest hemorrhage. Susceptibility artifact along the LEFT cerebellar tentorium at the incisura corresponding to calcification.  No abnormal extra-axial  fluid collections. No extra-axial masses though, contrast enhanced sequences would be more sensitive. Normal major intracranial vascular flow voids seen at the skull base.  Ocular globes and orbital contents are unremarkable though not tailored for evaluation. No abnormal sellar expansion. Visualized paranasal sinuses and mastoid air cells are well-aerated. No suspicious calvarial bone marrow signal. No abnormal sellar expansion. Craniocervical junction maintained.  MRA HEAD FINDINGS  Anterior circulation: Normal flow related enhancement of the included cervical, petrous, cavernous and supra clinoid internal carotid arteries. Patent anterior communicating artery. Normal flow related enhancement of the anterior and middle cerebral arteries, including more distal segments.  No large vessel occlusion, high-grade stenosis, abnormal luminal irregularity, aneurysm.  Posterior circulation: Codominant vertebral artery's. Basilar artery is patent, with normal flow related enhancement of the main branch vessels. Normal flow related enhancement of the posterior cerebral arteries.  No large vessel occlusion, high-grade stenosis, abnormal luminal irregularity, aneurysm.  IMPRESSION: MRI HEAD: Normal noncontrast MRI of the head.  MRA HEAD: Normal noncontrast MRA of the head.   Electronically Signed   By: Elon Alas   On: 10/20/2014 00:34   Mr Brain Wo Contrast  10/20/2014   CLINICAL DATA:  Chest pain, headache and LEFT arm numbness/pain while driving today. History of hypertension, obesity, smoking, schizophrenia, Bell's palsy.  EXAM: MRI HEAD WITHOUT CONTRAST  MRA HEAD WITHOUT CONTRAST  TECHNIQUE: Multiplanar, multiecho pulse sequences of the brain and surrounding structures were obtained without intravenous contrast. Angiographic images of the head were obtained using MRA technique without contrast.  COMPARISON:  CT of the head October 19, 2014  FINDINGS: MRI HEAD FINDINGS  The ventricles and sulci are normal for  patient's age. No abnormal parenchymal signal, mass lesions, mass effect. No reduced diffusion to suggest acute ischemia. No susceptibility artifact to suggest hemorrhage. Susceptibility artifact along the LEFT cerebellar tentorium at the incisura corresponding to calcification.  No abnormal extra-axial fluid collections. No extra-axial masses though, contrast enhanced sequences would be more sensitive. Normal major intracranial vascular flow voids seen at the skull base.  Ocular globes and orbital contents are unremarkable though not tailored for evaluation. No abnormal sellar expansion. Visualized paranasal sinuses and mastoid air cells are well-aerated. No suspicious calvarial bone marrow signal. No abnormal sellar expansion. Craniocervical junction maintained.  MRA HEAD FINDINGS  Anterior circulation: Normal flow related enhancement of the included cervical, petrous, cavernous and supra clinoid internal carotid arteries. Patent anterior communicating artery. Normal flow related enhancement of the anterior and middle cerebral arteries, including more distal segments.  No large vessel occlusion, high-grade stenosis, abnormal luminal irregularity, aneurysm.  Posterior circulation: Codominant vertebral artery's. Basilar artery is patent, with normal flow related enhancement of the  main branch vessels. Normal flow related enhancement of the posterior cerebral arteries.  No large vessel occlusion, high-grade stenosis, abnormal luminal irregularity, aneurysm.  IMPRESSION: MRI HEAD: Normal noncontrast MRI of the head.  MRA HEAD: Normal noncontrast MRA of the head.   Electronically Signed   By: Elon Alas   On: 10/20/2014 00:34    Microbiology: Recent Results (from the past 240 hour(s))  MRSA PCR Screening     Status: None   Collection Time: 10/21/14  8:52 AM  Result Value Ref Range Status   MRSA by PCR NEGATIVE NEGATIVE Final    Comment:        The GeneXpert MRSA Assay (FDA approved for NASAL  specimens only), is one component of a comprehensive MRSA colonization surveillance program. It is not intended to diagnose MRSA infection nor to guide or monitor treatment for MRSA infections.      Labs: Basic Metabolic Panel:  Recent Labs Lab 10/19/14 2119 10/19/14 2126 10/20/14 0140 10/20/14 0256  NA 137 139  --   --   K 3.3* 3.5  --   --   CL 102 101  --   --   CO2 24  --   --   --   GLUCOSE 84 84  --   --   BUN 8 8  --   --   CREATININE 1.08 1.10  --  1.04  CALCIUM 8.2*  --   --   --   MG  --   --  2.0  --    Liver Function Tests:  Recent Labs Lab 10/19/14 2119  AST 23  ALT 28  ALKPHOS 76  BILITOT 0.3  PROT 6.5  ALBUMIN 3.6   No results for input(s): LIPASE, AMYLASE in the last 168 hours. No results for input(s): AMMONIA in the last 168 hours. CBC:  Recent Labs Lab 10/19/14 2119 10/19/14 2126 10/20/14 0256  WBC 6.5  --  8.1  NEUTROABS 3.4  --   --   HGB 15.9 15.0 15.7  HCT 44.9 44.0 45.2  MCV 86.3  --  86.3  PLT 190  --  221   Cardiac Enzymes:  Recent Labs Lab 10/20/14 0140 10/20/14 0755 10/20/14 1350  TROPONINI <0.03 <0.03 <0.03   BNP: BNP (last 3 results) No results for input(s): BNP in the last 8760 hours.  ProBNP (last 3 results)  Recent Labs  01/16/14 2041 03/12/14 1315  PROBNP 10.2 29.5    CBG:  Recent Labs Lab 10/19/14 2120  GLUCAP 89       Signed:  Nita Sells  [facility] 10/21/2014, 2:51 PM

## 2014-10-21 NOTE — Progress Notes (Signed)
UR Completed Audon Heymann Graves-Bigelow, RN,BSN [phone]  

## 2014-10-21 NOTE — Progress Notes (Signed)
SLP Cancellation Note  Patient Details Name: Nathaniel [name] MRN: [id number] DOB: 1980/08/13   Cancelled treatment:       Reason Eval/Treat Not Completed: SLP screened, no needs identified, will sign off   Juan Quam Laurice 10/21/2014, 12:32 PM

## 2014-10-21 NOTE — Progress Notes (Signed)
Placed pt on NPO status for possible procedure post Echocardiogram

## 2014-10-21 NOTE — Progress Notes (Signed)
Pt said he was having left sided pain arouund deltoid. Gave a nitro SL and dilaudid, pain cleared up

## 2014-10-21 NOTE — Progress Notes (Signed)
Pt stated he did get a flu vaccination in 2015-2016 season, but unable to mark as such in Epic

## 2014-10-21 NOTE — Care Management Note (Unsigned)
**Note Nathaniel [name]-Identified via Obfuscation**     Page 1 of 1   10/21/2014     11:50:44 AM CARE MANAGEMENT NOTE 10/21/2014  Patient:  Nathaniel [name], Nathaniel [name]   Account Number:  [id number]  Date Initiated:  10/21/2014  Documentation initiated by:  GRAVES-BIGELOW,Erice Ahles  Subjective/Objective Assessment:   Pt admitted for headache and CP.     Action/Plan:   Referral received for CPAP. Sleep study set up  and placed on AVS. CM did speak with [facility] Liaison for DME CPAP. Liaison will discuss payment options with pt.   Anticipated DC Date:  10/23/2014   Anticipated DC Plan:  [place]  CM consult      PAC Choice  DURABLE MEDICAL EQUIPMENT   Choice offered to / List presented to:  C-1 Patient   DME arranged  CPAP      DME agency  [facility].        Status of service:  In process, will continue to follow Medicare Important Message given?  NO (If response is "NO", the following Medicare IM given date fields will be blank) Date Medicare IM given:   Medicare IM given by:   Date Additional Medicare IM given:   Additional Medicare IM given by:    Discharge Disposition:    Per UR Regulation:  Reviewed for med. necessity/level of care/duration of stay  If discussed at [place] of Stay Meetings, dates discussed:    Comments:

## 2014-10-21 NOTE — Progress Notes (Signed)
Pt complained of HA, paged on call for further analgesia as dilaudid not due

## 2014-10-23 ENCOUNTER — Emergency Department (HOSPITAL_COMMUNITY): Payer: Managed Care, Other (non HMO)

## 2014-10-23 ENCOUNTER — Encounter (HOSPITAL_COMMUNITY): Payer: Self-pay | Admitting: Emergency Medicine

## 2014-10-23 ENCOUNTER — Emergency Department (HOSPITAL_COMMUNITY)
Admission: EM | Admit: 2014-10-23 | Discharge: 2014-10-24 | Disposition: A | Discharge status: Home / Self Care | Payer: Managed Care, Other (non HMO) | Attending: Emergency Medicine | Admitting: Emergency Medicine

## 2014-10-23 DIAGNOSIS — Z880 Allergy status to penicillin: Secondary | ICD-10-CM | POA: Insufficient documentation

## 2014-10-23 DIAGNOSIS — Z7982 Long term (current) use of aspirin: Secondary | ICD-10-CM | POA: Insufficient documentation

## 2014-10-23 DIAGNOSIS — F209 Schizophrenia, unspecified: Secondary | ICD-10-CM | POA: Insufficient documentation

## 2014-10-23 DIAGNOSIS — R079 Chest pain, unspecified: Secondary | ICD-10-CM | POA: Insufficient documentation

## 2014-10-23 DIAGNOSIS — J45909 Unspecified asthma, uncomplicated: Secondary | ICD-10-CM | POA: Insufficient documentation

## 2014-10-23 DIAGNOSIS — J449 Chronic obstructive pulmonary disease, unspecified: Secondary | ICD-10-CM | POA: Diagnosis not present

## 2014-10-23 DIAGNOSIS — Z8669 Personal history of other diseases of the nervous system and sense organs: Secondary | ICD-10-CM | POA: Diagnosis not present

## 2014-10-23 DIAGNOSIS — Z720 Tobacco use: Secondary | ICD-10-CM | POA: Insufficient documentation

## 2014-10-23 DIAGNOSIS — Z8719 Personal history of other diseases of the digestive system: Secondary | ICD-10-CM | POA: Insufficient documentation

## 2014-10-23 DIAGNOSIS — Z79899 Other long term (current) drug therapy: Secondary | ICD-10-CM | POA: Insufficient documentation

## 2014-10-23 DIAGNOSIS — F319 Bipolar disorder, unspecified: Secondary | ICD-10-CM | POA: Insufficient documentation

## 2014-10-23 DIAGNOSIS — I10 Essential (primary) hypertension: Secondary | ICD-10-CM | POA: Insufficient documentation

## 2014-10-23 DIAGNOSIS — Z91040 Latex allergy status: Secondary | ICD-10-CM | POA: Diagnosis not present

## 2014-10-23 DIAGNOSIS — M79602 Pain in left arm: Secondary | ICD-10-CM | POA: Insufficient documentation

## 2014-10-23 DIAGNOSIS — R519 Headache, unspecified: Secondary | ICD-10-CM

## 2014-10-23 DIAGNOSIS — R51 Headache: Secondary | ICD-10-CM | POA: Diagnosis not present

## 2014-10-23 DIAGNOSIS — M542 Cervicalgia: Secondary | ICD-10-CM | POA: Diagnosis not present

## 2014-10-23 LAB — CBC WITH DIFFERENTIAL/PLATELET
Basophils Absolute: 0 10*3/uL (ref 0.0–0.1)
Basophils Relative: 1 % (ref 0–1)
EOS ABS: 0.3 10*3/uL (ref 0.0–0.7)
Eosinophils Relative: 4 % (ref 0–5)
HCT: 45.9 % (ref 39.0–52.0)
Hemoglobin: 16.6 g/dL (ref 13.0–17.0)
Lymphocytes Relative: 29 % (ref 12–46)
Lymphs Abs: 2.1 10*3/uL (ref 0.7–4.0)
MCH: 31.3 pg (ref 26.0–34.0)
MCHC: 36.2 g/dL — AB (ref 30.0–36.0)
MCV: 86.6 fL (ref 78.0–100.0)
MONOS PCT: 8 % (ref 3–12)
Monocytes Absolute: 0.6 10*3/uL (ref 0.1–1.0)
Neutro Abs: 4.2 10*3/uL (ref 1.7–7.7)
Neutrophils Relative %: 58 % (ref 43–77)
Platelets: 210 10*3/uL (ref 150–400)
RBC: 5.3 MIL/uL (ref 4.22–5.81)
RDW: 12.2 % (ref 11.5–15.5)
WBC: 7.2 10*3/uL (ref 4.0–10.5)

## 2014-10-23 LAB — I-STAT CHEM 8, ED
BUN: 10 mg/dL (ref 6–23)
Calcium, Ion: 1.17 mmol/L (ref 1.12–1.23)
Chloride: 102 mmol/L (ref 96–112)
Creatinine, Ser: 1 mg/dL (ref 0.50–1.35)
Glucose, Bld: 93 mg/dL (ref 70–99)
HEMATOCRIT: 49 % (ref 39.0–52.0)
Hemoglobin: 16.7 g/dL (ref 13.0–17.0)
Potassium: 3.4 mmol/L — ABNORMAL LOW (ref 3.5–5.1)
SODIUM: 141 mmol/L (ref 135–145)
TCO2: 22 mmol/L (ref 0–100)

## 2014-10-23 LAB — I-STAT TROPONIN, ED: Troponin i, poc: 0 ng/mL (ref 0.00–0.08)

## 2014-10-23 NOTE — ED Notes (Addendum)
Pt c/o left chest pain onset 3 days ago.  Also  C/o migraine headache.  Pt st's he has had chest pain since he was discharged from hosp on Tues.  Pt also c/o dizziness and being lightheaded.  Vomited x's 1  Pt also c/o neck pain

## 2014-10-24 ENCOUNTER — Emergency Department (HOSPITAL_COMMUNITY): Payer: Managed Care, Other (non HMO)

## 2014-10-24 ENCOUNTER — Encounter (HOSPITAL_COMMUNITY): Payer: Self-pay

## 2014-10-24 MED ORDER — IOHEXOL 350 MG/ML SOLN
100.0000 mL | Freq: Once | INTRAVENOUS | Status: AC | PRN
  Administered 2014-10-24: 100 mL via INTRAVENOUS

## 2014-10-24 MED ORDER — HYDROMORPHONE HCL 1 MG/ML IJ SOLN
1.0000 mg | Freq: Once | INTRAMUSCULAR | Status: AC
  Administered 2014-10-24: 1 mg via INTRAVENOUS
  Filled 2014-10-24: qty 1

## 2014-10-24 MED ORDER — PROCHLORPERAZINE EDISYLATE 5 MG/ML IJ SOLN
10.0000 mg | Freq: Once | INTRAMUSCULAR | Status: AC
  Administered 2014-10-24: 10 mg via INTRAVENOUS
  Filled 2014-10-24: qty 2

## 2014-10-24 NOTE — ED Provider Notes (Signed)
CSN: [id number]     Arrival date & time 10/23/14  2103 History   First MD Initiated Contact with Patient 10/23/14 2211     Chief Complaint  Patient presents with  . Chest Pain      HPI Patient was admitted the hospital for stroke workup 5 days ago when he developed sudden chest pain and left arm pain with associated mild weakness of his left arm and headache.  He was worked up in the hospital mainly from a stroke standpoint and found to have a 50% carotid stenosis but no other abnormalities was discharged home with aspirin.  He states he's continued having ongoing chest discomfort since then.  He has ongoing headache as well.  His wife reports his had some nausea and vomiting at home.  She states that she's never seen him have this type of chest discomfort before.  No known history of coronary artery disease.  He currently does not work and is seeking disability.  He's tried over-the-counter pain medications without improvement in his pain.  No hematemesis.   Past Medical History  Diagnosis Date  . COPD (chronic obstructive pulmonary disease)   . Bell's palsy   . IBS (irritable bowel syndrome)   . Hypertension   . Bipolar 1 disorder   . Schizophrenia   . Asthma    Past Surgical History  Procedure Laterality Date  . Cholecystectomy    . Abdominal surgery    . Incision and drainage Left 02/05/2013    Dr Rolena Infante  . Knee arthroscopy with medial menisectomy Left 02/05/2013    Procedure: KNEE ARTHROSCOPY  ;  Surgeon: Melina Schools, MD;  Location: [facility];  Service: Orthopedics;  Laterality: Left;  . Irrigation and debridement knee  02/05/2013    Procedure: IRRIGATION AND DEBRIDEMENT KNEE patella bursa;  Surgeon: Melina Schools, MD;  Location: [facility] OR;  Service: Orthopedics;;   Family History  Problem Relation Age of Onset  . Hypertension Mother   . CAD Father     MI in his late 69s  . Stroke Father   . Diabetes Paternal Grandfather   . Pancreatic cancer Maternal Grandmother   . Prostate  cancer Father   . Clotting disorder Maternal Grandfather   . Heart disease Maternal Grandfather    History  Substance Use Topics  . Smoking status: Current Every Day Smoker -- 1.00 packs/day for 12 years    Types: Cigarettes  . Smokeless tobacco: Never Used  . Alcohol Use: 0.0 oz/week    0 Standard drinks or equivalent per week     Comment: occasionallty     Review of Systems  All other systems reviewed and are negative.     Allergies  Flagyl; Lithium; Penicillins; Phenergan; Vancomycin; and Latex  Home Medications   Prior to Admission medications   Medication Sig Start Date End Date Taking? Authorizing Provider  albuterol (PROVENTIL HFA;VENTOLIN HFA) 108 (90 BASE) MCG/ACT inhaler Inhale 2 puffs into the lungs every 6 (six) hours as needed for wheezing or shortness of breath. 03/25/14  Yes Lorayne Marek, MD  aspirin 81 MG chewable tablet Chew 1 tablet (81 mg total) by mouth daily. 10/21/14  Yes Nita Sells, MD  divalproex (DEPAKOTE) 500 MG DR tablet Take 1,000 mg by mouth at bedtime.   Yes Historical Provider, MD  omeprazole (PRILOSEC) 40 MG capsule Take 1 capsule (40 mg total) by mouth daily. 06/04/14  Yes Irene Shipper, MD  lurasidone (LATUDA) 40 MG TABS tablet Take 1 tablet (40 mg  total) by mouth every evening. Patient not taking: Reported on 10/23/2014 10/21/14   Nita Sells, MD   BP 120/71 mmHg  Pulse 68  Temp(Src) 97.9 F (36.6 C)  Resp 16  Ht 6\' 1"  (1.854 m)  Wt 263 lb (119.296 kg)  BMI 34.71 kg/m2  SpO2 93% Physical Exam  Constitutional: He is oriented to person, place, and time. He appears well-developed and well-nourished.  HENT:  Head: Normocephalic and atraumatic.  Eyes: EOM are normal. Pupils are equal, round, and reactive to light.  Neck: Normal range of motion.  Cardiovascular: Normal rate, regular rhythm, normal heart sounds and intact distal pulses.   Pulmonary/Chest: Effort normal and breath sounds normal. No respiratory distress.   Abdominal: Soft. He exhibits no distension. There is no tenderness.  Musculoskeletal: Normal range of motion.  Neurological: He is alert and oriented to person, place, and time.  5/5 strength in major muscle groups of  bilateral upper and lower extremities. Speech normal. No facial asymetry.   Skin: Skin is warm and dry.  Psychiatric: He has a normal mood and affect. Judgment normal.  Nursing note and vitals reviewed.   ED Course  Procedures (including critical care time) Labs Review Labs Reviewed  CBC WITH DIFFERENTIAL/PLATELET - Abnormal; Notable for the following:    MCHC 36.2 (*)    All other components within normal limits  I-STAT CHEM 8, ED - Abnormal; Notable for the following:    Potassium 3.4 (*)    All other components within normal limits  I-STAT TROPOININ, ED    Imaging Review Dg Chest 2 View  10/23/2014   CLINICAL DATA:  Chest pain  EXAM: CHEST  2 VIEW  COMPARISON:  10/19/2014  FINDINGS: Mildly low lung volumes. There is no edema, consolidation, effusion, or pneumothorax. Normal heart size and aortic contours. Cholecystectomy.  IMPRESSION: No active cardiopulmonary disease.   Electronically Signed   By: Monte Fantasia M.D.   On: 10/23/2014 22:10     EKG Interpretation   Date/Time:  Thursday October 23 2014 21:07:36 EDT Ventricular Rate:  83 PR Interval:  136 QRS Duration: 98 QT Interval:  368 QTC Calculation: 432 R Axis:   20 Text Interpretation:  Normal sinus rhythm Incomplete right bundle branch  block Borderline ECG No significant change was found Confirmed by Anastaisa Wooding   MD, Lennette Bihari ([id number]) on 10/24/2014 12:32:10 AM      MDM   Final diagnoses:  Chest pain    It's mentioned in the neurology note that an evaluation for aortic dissection was going to be completed however a CT injury of his chest abdomen and pelvis was never completed.  He's had ongoing chest discomfort since then as well as pain out into his left arm and into his left neck.  He continues to  have headache.  He'll undergo head CT at this time as well as angiographic imaging of his aortic arch and descending aorta.  At this time I'll treat his pain and treat his headache with Compazine and opioid pain medication.  Will work on pain control this time.  If his CT angiogram chest abdomen and pelvis is without abnormality and his head CT is normal and did not think additional inpatient workup is necessary.  During this time he will receive a second troponin.  EKG is nonischemic.  Doubt PE.  Doubt ACS.  Care to Dr Candace Gallus, MD 10/24/14 [phone]

## 2014-10-24 NOTE — ED Notes (Signed)
Pt verbalized understanding of d/c instructions and has no further questions.  

## 2014-10-24 NOTE — Discharge Instructions (Signed)
Your workup today has not shown a specific cause for your pain.  Please continue to follow up with your doctors for further evaluation and treatment of your symptoms.   Chest Pain (Nonspecific) It is often hard to give a diagnosis for the cause of chest pain. There is always a chance that your pain could be related to something serious, such as a heart attack or a blood clot in the lungs. You need to follow up with your doctor. HOME CARE  If antibiotic medicine was given, take it as directed by your doctor. Finish the medicine even if you start to feel better.  For the next few days, avoid activities that bring on chest pain. Continue physical activities as told by your doctor.  Do not use any tobacco products. This includes cigarettes, chewing tobacco, and e-cigarettes.  Avoid drinking alcohol.  Only take medicine as told by your doctor.  Follow your doctor's suggestions for more testing if your chest pain does not go away.  Keep all doctor visits you made. GET HELP IF:  Your chest pain does not go away, even after treatment.  You have a rash with blisters on your chest.  You have a fever. GET HELP RIGHT AWAY IF:   You have more pain or pain that spreads to your arm, neck, jaw, back, or belly (abdomen).  You have shortness of breath.  You cough more than usual or cough up blood.  You have very bad back or belly pain.  You feel sick to your stomach (nauseous) or throw up (vomit).  You have very bad weakness.  You pass out (faint).  You have chills. This is an emergency. Do not wait to see if the problems will go away. Call your local emergency services (911 in [place] Do not drive yourself to the hospital. MAKE SURE YOU:   Understand these instructions.  Will watch your condition.  Will get help right away if you are not doing well or get worse. Document Released: 12/21/2007 Document Revised: 07/09/2013 Document Reviewed: 12/21/2007 [facility] Patient Information  2015 [place], [place]. This information is not intended to replace advice given to you by your health care provider. Make sure you discuss any questions you have with your health care provider.  General Headache Without Cause A headache is pain or discomfort felt around the head or neck area. The specific cause of a headache may not be found. There are many causes and types of headaches. A few common ones are:  Tension headaches.  Migraine headaches.  Cluster headaches.  Chronic daily headaches. HOME CARE INSTRUCTIONS   Keep all follow-up appointments with your caregiver or any specialist referral.  Only take over-the-counter or prescription medicines for pain or discomfort as directed by your caregiver.  Lie down in a dark, quiet room when you have a headache.  Keep a headache journal to find out what may trigger your migraine headaches. For example, write down:  What you eat and drink.  How much sleep you get.  Any change to your diet or medicines.  Try massage or other relaxation techniques.  Put ice packs or heat on the head and neck. Use these 3 to 4 times per day for 15 to 20 minutes each time, or as needed.  Limit stress.  Sit up straight, and do not tense your muscles.  Quit smoking if you smoke.  Limit alcohol use.  Decrease the amount of caffeine you drink, or stop drinking caffeine.  Eat and sleep on a  regular schedule.  Get 7 to 9 hours of sleep, or as recommended by your caregiver.  Keep lights dim if bright lights bother you and make your headaches worse. SEEK MEDICAL CARE IF:   You have problems with the medicines you were prescribed.  Your medicines are not working.  You have a change from the usual headache.  You have nausea or vomiting. SEEK IMMEDIATE MEDICAL CARE IF:   Your headache becomes severe.  You have a fever.  You have a stiff neck.  You have loss of vision.  You have muscular weakness or loss of muscle control.  You start  losing your balance or have trouble walking.  You feel faint or pass out.  You have severe symptoms that are different from your first symptoms. MAKE SURE YOU:   Understand these instructions.  Will watch your condition.  Will get help right away if you are not doing well or get worse. Document Released: 07/04/2005 Document Revised: 09/26/2011 Document Reviewed: 07/20/2011 [facility] Patient Information 2015 [place], [place]. This information is not intended to replace advice given to you by your health care provider. Make sure you discuss any questions you have with your health care provider.  Pain of Unknown Etiology (Pain Without a Known Cause) You have come to your caregiver because of pain. Pain can occur in any part of the body. Often there is not a definite cause. If your laboratory (blood or urine) work was normal and X-rays or other studies were normal, your caregiver may treat you without knowing the cause of the pain. An example of this is the headache. Most headaches are diagnosed by taking a history. This means your caregiver asks you questions about your headaches. Your caregiver determines a treatment based on your answers. Usually testing done for headaches is normal. Often testing is not done unless there is no response to medications. Regardless of where your pain is located today, you can be given medications to make you comfortable. If no physical cause of pain can be found, most cases of pain will gradually leave as suddenly as they came.  If you have a painful condition and no reason can be found for the pain, it is important that you follow up with your caregiver. If the pain becomes worse or does not go away, it may be necessary to repeat tests and look further for a possible cause.  Only take over-the-counter or prescription medicines for pain, discomfort, or fever as directed by your caregiver.  For the protection of your privacy, test results cannot be given over the  phone. Make sure you receive the results of your test. Ask how these results are to be obtained if you have not been informed. It is your responsibility to obtain your test results.  You may continue all activities unless the activities cause more pain. When the pain lessens, it is important to gradually resume normal activities. Resume activities by beginning slowly and gradually increasing the intensity and duration of the activities or exercise. During periods of severe pain, bed rest may be helpful. Lie or sit in any position that is comfortable.  Ice used for acute (sudden) conditions may be effective. Use a large plastic bag filled with ice and wrapped in a towel. This may provide pain relief.  See your caregiver for continued problems. Your caregiver can help or refer you for exercises or physical therapy if necessary. If you were given medications for your condition, do not drive, operate machinery or power tools,  or sign legal documents for 24 hours. Do not drink alcohol, take sleeping pills, or take other medications that may interfere with treatment. See your caregiver immediately if you have pain that is becoming worse and not relieved by medications. Document Released: 03/29/2001 Document Revised: 04/24/2013 Document Reviewed: 07/04/2005 [facility] Patient Information 2015 [place], [place]. This information is not intended to replace advice given to you by your health care provider. Make sure you discuss any questions you have with your health care provider.

## 2014-10-28 ENCOUNTER — Ambulatory Visit (HOSPITAL_BASED_OUTPATIENT_CLINIC_OR_DEPARTMENT_OTHER): Payer: Managed Care, Other (non HMO) | Attending: Family Medicine

## 2014-10-28 VITALS — Ht 73.0 in | Wt 268.0 lb

## 2014-10-28 DIAGNOSIS — G4733 Obstructive sleep apnea (adult) (pediatric): Secondary | ICD-10-CM | POA: Diagnosis not present

## 2014-10-28 DIAGNOSIS — G4710 Hypersomnia, unspecified: Secondary | ICD-10-CM | POA: Diagnosis present

## 2014-10-28 DIAGNOSIS — R0683 Snoring: Secondary | ICD-10-CM | POA: Diagnosis not present

## 2014-11-01 DIAGNOSIS — G4733 Obstructive sleep apnea (adult) (pediatric): Secondary | ICD-10-CM | POA: Diagnosis not present

## 2014-11-01 NOTE — Sleep Study (Signed)
   NAME: Nathaniel [name] DATE OF BIRTH:  1981/03/04 MEDICAL RECORD NUMBER [id number]  LOCATION: [facility]  PHYSICIAN: YOUNG,CLINTON D  DATE OF STUDY: 10/28/2014  SLEEP STUDY TYPE: Nocturnal Polysomnogram               REFERRING PHYSICIAN: Nita Sells, MD  INDICATION FOR STUDY: Hypersomnia with sleep apnea  EPWORTH SLEEPINESS SCORE:   15/24 HEIGHT: 6\' 1"  (185.4 cm)  WEIGHT: 268 lb (121.564 kg)    Body mass index is 35.37 kg/(m^2).  NECK SIZE: 19 in.  MEDICATIONS: Charted for review  SLEEP ARCHITECTURE: Split study protocol. During the diagnostic phase, total sleep time 178 minutes with sleep efficiency 80.7%. Stage I was 30.9%, stage II 60.7%, stage III absent, REM 8.4% of total sleep time. Sleep latency 10 minutes, REM latency 61.5 minutes, awake after sleep onset 32.5 minutes, arousal index 19.2, bedtime medication: None  RESPIRATORY DATA: Apnea hypopnea index (AHI) 15.5 per hour. 46 total events scored including 3 obstructive apneas, 1 central apnea, 42 hypopneas. Events in all sleep positions, especially nonsupine. REM AHI 48.0 per hour. CPAP titration to 12 CWP, AHI 1.6 per hour. He wore a nasal mask.  OXYGEN DATA: Moderate to loud snoring with oxygen desaturation to a nadir of 82% before CPAP. With CPAP control, snoring was prevented and mean oxygen saturation was 92.8% on room air.  CARDIAC DATA: Sinus rhythm  MOVEMENT/PARASOMNIA: A few limb jerks were noted throughout the study but with little apparent effect on sleep. Bathroom 1  IMPRESSION/ RECOMMENDATION:   1) Moderate obstructive sleep apnea/hypopnea syndrome, AHI 15.5 per hour with more events while nonsupine. REM AHI 48.0 per hour. Moderate to loud snoring with oxygen desaturation to a nadir of 82% on room air before CPAP. 2) Successful CPAP titration to 12 CWP, AHI 1.6 per hour. He wore a medium [employer]  nasal mask but had problems with oral venting and should be tried with alternative  mask styles. Snoring was prevented by CPAP and mean oxygen saturation 92.8%.  Deneise Lever Diplomate, American Board of Sleep Medicine  ELECTRONICALLY SIGNED ON:  11/01/2014, 10:14 AM [place] PH: [phone]   FX: [phone] [place]

## 2014-11-04 ENCOUNTER — Encounter: Payer: Self-pay | Admitting: Internal Medicine

## 2014-11-04 ENCOUNTER — Ambulatory Visit (INDEPENDENT_AMBULATORY_CARE_PROVIDER_SITE_OTHER): Payer: Managed Care, Other (non HMO) | Admitting: Internal Medicine

## 2014-11-04 VITALS — BP 122/72 | HR 78 | Ht 73.0 in | Wt 268.0 lb

## 2014-11-04 DIAGNOSIS — F17200 Nicotine dependence, unspecified, uncomplicated: Secondary | ICD-10-CM | POA: Insufficient documentation

## 2014-11-04 DIAGNOSIS — R0689 Other abnormalities of breathing: Secondary | ICD-10-CM | POA: Diagnosis not present

## 2014-11-04 DIAGNOSIS — R0600 Dyspnea, unspecified: Secondary | ICD-10-CM | POA: Insufficient documentation

## 2014-11-04 DIAGNOSIS — IMO0001 Reserved for inherently not codable concepts without codable children: Secondary | ICD-10-CM | POA: Insufficient documentation

## 2014-11-04 DIAGNOSIS — Z720 Tobacco use: Secondary | ICD-10-CM

## 2014-11-04 MED ORDER — TIOTROPIUM BROMIDE MONOHYDRATE 18 MCG IN CAPS: 18.0000 ug | ORAL_CAPSULE | Freq: Every day | RESPIRATORY_TRACT | Status: DC

## 2014-11-04 NOTE — Patient Instructions (Addendum)
ICD-9-CM ICD-10-CM   1. Dyspnea and respiratory abnormality 786.09 R06.00     R06.89   2. Smoking 305.1 Z72.0    Likely diagnosis is copd / asthma  PLAN Please start empiric spiriva 1 puff daily - take sample,and show technique Quit smoking Have full PFT next few weeks  Followup Followup with my NP Tammy next few weeks to assess response to spiriva and PFT test results and adjustment of inhaler

## 2014-11-04 NOTE — Progress Notes (Signed)
Subjective:    Patient ID: Nathaniel [name], male    DOB: 1980-08-13, 34 y.o.   MRN: [id number] PCP Lorayne Marek, MD  HPI  IOV 11/04/2014  Chief Complaint  Patient presents with  . Pulmonary Consult    Pt referred by Dr. Verlon Au for COPD. Pt c/o SOB with activty and rest, mild dry cough. Pt c/o left upper chest pain.    34 year old male accompanied by his wife. He is a poor historian. He has been referred by the hospitalist for COPD evaluation. He was admitted in March 2016 for possible complex migraine. His breathing status is a chronic issue. There are no recent acute issues. He reports a diagnosis of COPD being made in 2009 when he was at [facility] for gallbladder issues. At that time he recollects a pulmonary function test. After that he was placed on Symbicort twice a day but he ran out of this few months or few years later. He has not been on any inhalers other than albuterol for rescue in the last several years. His albuterol usage in the last few years is being 2-4 times per day. He feels his dyspnea is progressively worse the last few to several months. He feels dyspneic even at rest but made worse with exertion. No relieving factors. There is associated wheeze and some dry cough. No sputum production. There are no other aggravating or relieving factors. Beyond this am not able to get a better history from him.  Of note, he says he's had a sleep study done but this is being reviewed by his primary care physician apparently   CXR 10/23/14: Clear    has a past medical history of COPD (chronic obstructive pulmonary disease); Bell's palsy; IBS (irritable bowel syndrome); Hypertension; Bipolar 1 disorder; Schizophrenia; and Asthma.   reports that he has been smoking Cigarettes.  He has a 14 pack-year smoking history. He has never used smokeless tobacco.  Startted smoking age 18 or even earlier. Poor historian  - 2001 - 3 ppd x 6 year  + 2.5 ppd x 10 years +  (> 25  ppd per MD count)  Past Surgical History  Procedure Laterality Date  . Cholecystectomy    . Abdominal surgery    . Incision and drainage Left 02/05/2013    Dr Rolena Infante  . Knee arthroscopy with medial menisectomy Left 02/05/2013    Procedure: KNEE ARTHROSCOPY  ;  Surgeon: Melina Schools, MD;  Location: [place];  Service: Orthopedics;  Laterality: Left;  . Irrigation and debridement knee  02/05/2013    Procedure: IRRIGATION AND DEBRIDEMENT KNEE patella bursa;  Surgeon: Melina Schools, MD;  Location: [place];  Service: Orthopedics;;    Allergies  Allergen Reactions  . Flagyl [Metronidazole]     anaphylaxis  . Lithium Anaphylaxis  . Penicillins Anaphylaxis  . Phenergan [Promethazine Hcl]     seizures  . Vancomycin     Anaphylaxis   . Latex     rash    Immunization History  Administered Date(s) Administered  . Influenza Split 07/19/2012, 06/17/2014  . Pneumococcal Polysaccharide-23 07/19/2012    Family History  Problem Relation Age of Onset  . Hypertension Mother   . CAD Father     MI in his late 21s  . Stroke Father   . Diabetes Paternal Grandfather   . Pancreatic cancer Maternal Grandmother   . Prostate cancer Father   . Clotting disorder Maternal Grandfather   . Heart disease Maternal Grandfather  Current outpatient prescriptions:  .  albuterol (PROVENTIL HFA;VENTOLIN HFA) 108 (90 BASE) MCG/ACT inhaler, Inhale 2 puffs into the lungs every 6 (six) hours as needed for wheezing or shortness of breath., Disp: 1 Inhaler, Rfl: 2 .  aspirin 81 MG chewable tablet, Chew 1 tablet (81 mg total) by mouth daily., Disp: 30 tablet, Rfl: 0 .  divalproex (DEPAKOTE) 500 MG DR tablet, Take 1,000 mg by mouth at bedtime., Disp: , Rfl:  .  omeprazole (PRILOSEC) 40 MG capsule, Take 1 capsule (40 mg total) by mouth daily., Disp: 30 capsule, Rfl: 11     Review of Systems  Constitutional: Negative for fever and unexpected weight change.  HENT: Negative for congestion, dental problem, ear  pain, nosebleeds, postnasal drip, rhinorrhea, sinus pressure, sneezing, sore throat and trouble swallowing.   Eyes: Negative for redness and itching.  Respiratory: Positive for cough, shortness of breath and wheezing. Negative for chest tightness.   Cardiovascular: Positive for chest pain. Negative for palpitations and leg swelling.  Gastrointestinal: Negative for nausea and vomiting.  Genitourinary: Negative for dysuria.  Musculoskeletal: Negative for joint swelling.  Skin: Negative for rash.  Neurological: Positive for headaches.  Hematological: Does not bruise/bleed easily.  Psychiatric/Behavioral: Negative for dysphoric mood. The patient is not nervous/anxious.        Objective:   Physical Exam  Constitutional: He is oriented to person, place, and time. He appears well-developed and well-nourished. No distress.  Obese   HENT:  Head: Normocephalic and atraumatic.  Right Ear: External ear normal.  Left Ear: External ear normal.  Mouth/Throat: Oropharynx is clear and moist. No oropharyngeal exudate.  Eyes: Conjunctivae and EOM are normal. Pupils are equal, round, and reactive to light. Right eye exhibits no discharge. Left eye exhibits no discharge. No scleral icterus.  Neck: Normal range of motion. Neck supple. No JVD present. No tracheal deviation present. No thyromegaly present.  Cardiovascular: Normal rate, regular rhythm and intact distal pulses.  Exam reveals no gallop and no friction rub.   No murmur heard. Pulmonary/Chest: Effort normal and breath sounds normal. No respiratory distress. He has no wheezes. He has no rales. He exhibits no tenderness.  Abdominal: Soft. Bowel sounds are normal. He exhibits no distension and no mass. There is no tenderness. There is no rebound and no guarding.  Visceral obesity +  Musculoskeletal: Normal range of motion. He exhibits no edema or tenderness.  Lymphadenopathy:    He has no cervical adenopathy.  Neurological: He is alert and  oriented to person, place, and time. He has normal reflexes. No cranial nerve deficit. Coordination normal.  Skin: Skin is warm and dry. No rash noted. He is not diaphoretic. No erythema. No pallor.  tatoo +  Psychiatric:  Poor historian  Nursing note and vitals reviewed.    Filed Vitals:   11/04/14 0902  BP: 122/72  Pulse: 78  Height: 6\' 1"  (1.854 m)  Weight: 268 lb (121.564 kg)  SpO2: 96%        Assessment & Plan:     ICD-9-CM ICD-10-CM   1. Dyspnea and respiratory abnormality 786.09 R06.00 Pulmonary function test    R06.89   2. Smoking 305.1 Z72.0 Pulmonary function test    Likely diagnosis is copd / asthma  PLAN Please start empiric  spiriva 1 puff daily - take sample,and show technique Quit smoking Have full PFT next few weeks  Followup Followup with my NP Tammy next few weeks to assess response to spiriva and PFT test results Will adjust  inhaler therapy depending on PFT results    Dr. Brand Males, M.D., [facility].C.P Pulmonary and Critical Care Medicine Staff Physician [place] [facility] Pager: [phone], If no answer or between  15:00h - 7:00h: call [phone]  11/04/2014 9:25 AM

## 2014-11-08 NOTE — Op Note (Signed)
PATIENT NAME:  Nathaniel [name], Nathaniel [name] MR#:  [id number] DATE OF BIRTH:  [birth year]  DATE OF PROCEDURE:  07/03/2014  SURGEON: Marlyce Huge, MD   PREOPERATIVE DIAGNOSIS: Painful incarcerated incisional hernia.   POSTOPERATIVE DIAGNOSIS: Painful incarcerated incisional hernia.  PROCEDURE PERFORMED: Repair of incisional hernia without mesh.   ANESTHESIA: General.   ESTIMATED BLOOD LOSS: 10 mL.   COMPLICATIONS: None.   SPECIMEN: None.   INDICATION FOR SURGERY: [name] is a pleasant 34 year old with a history of a midline incision over which he was having pain. He was noted to have a small fat containing hernia, and thus he was brought to the operating room suite for repair of a hernia.   DETAILS OF PROCEDURE: After informed consent was obtained, [name] was brought to the operating room suite. He was induced. Endotracheal tube was placed, general anesthesia was administered. His abdomen was prepped and draped in standard surgical fashion. A timeout was then performed correctly identifying the patient name, operative site, and procedure to be performed. A supraumbilical incision was made over the area of pain in approximately the area of the hernia. This was deepened down to the fascia. The hernia was visualized. The hernia sac was dissected out around from the surrounding tissue. It was opened and a unreducible herniated omentum was ligated. After I had isolated the edges of the small hernia and noted that it was too small to easily place a piece of mesh without having opened it much larger, the decision was made to close the fascia transversely using interrupted 3-0 Ethibond sutures. These were ran from one side to the other end and tied. After I was happy with the closure, the wound was irrigated and then closed in two layers; a deep dermal running 3-0 Vicryl and a running 4-0 Monocryl subcuticular. Steri-Strips, Telfa gauze, and Tegaderm were then placed over the wound. The patient was  then awoken, extubated, and brought to the postanesthesia care unit. There were no immediate complications. Needle, sponge, and instrument counts were correct at the end of the procedure.    ____________________________ Glena Norfolk. Netty Sullivant, MD cal:bm D: 07/04/2014 19:20:56 ET T: 07/05/2014 01:37:13 ET JOB#: [id number]  cc: Harrell Gave A. Juddson Cobern, MD, <Dictator> Floyde Parkins MD ELECTRONICALLY SIGNED 07/15/2014 16:27

## 2014-12-01 ENCOUNTER — Telehealth: Payer: Self-pay | Admitting: Internal Medicine

## 2014-12-01 NOTE — Telephone Encounter (Signed)
Spoke with pt. Advised him that I can't tell anyone called him. Does have appointments coming up this week. Reminded him these. Nothing further was needed.

## 2014-12-03 ENCOUNTER — Ambulatory Visit (INDEPENDENT_AMBULATORY_CARE_PROVIDER_SITE_OTHER): Payer: Managed Care, Other (non HMO) | Admitting: Internal Medicine

## 2014-12-03 DIAGNOSIS — R0689 Other abnormalities of breathing: Secondary | ICD-10-CM

## 2014-12-03 DIAGNOSIS — R0600 Dyspnea, unspecified: Secondary | ICD-10-CM | POA: Diagnosis not present

## 2014-12-03 DIAGNOSIS — F17200 Nicotine dependence, unspecified, uncomplicated: Secondary | ICD-10-CM

## 2014-12-03 DIAGNOSIS — IMO0001 Reserved for inherently not codable concepts without codable children: Secondary | ICD-10-CM

## 2014-12-03 LAB — PULMONARY FUNCTION TEST
DL/VA % PRED: 120 %
DL/VA: 5.79 ml/min/mmHg/L
DLCO UNC % PRED: 100 %
DLCO unc: 35.16 ml/min/mmHg
FEF 25-75 POST: 4.07 L/s
FEF 25-75 PRE: 4.09 L/s
FEF2575-%CHANGE-POST: 0 %
FEF2575-%PRED-POST: 89 %
FEF2575-%PRED-PRE: 89 %
FEV1-%Change-Post: 0 %
FEV1-%PRED-POST: 79 %
FEV1-%PRED-PRE: 79 %
FEV1-Post: 3.73 L
FEV1-Pre: 3.73 L
FEV1FVC-%Change-Post: 0 %
FEV1FVC-%Pred-Pre: 102 %
FEV6-%CHANGE-POST: 0 %
FEV6-%Pred-Post: 79 %
FEV6-%Pred-Pre: 78 %
FEV6-Post: 4.52 L
FEV6-Pre: 4.47 L
FEV6FVC-%PRED-POST: 102 %
FEV6FVC-%Pred-Pre: 102 %
FVC-%Change-Post: 0 %
FVC-%PRED-POST: 78 %
FVC-%Pred-Pre: 77 %
FVC-Post: 4.52 L
FVC-Pre: 4.47 L
PRE FEV1/FVC RATIO: 83 %
Post FEV1/FVC ratio: 83 %
Post FEV6/FVC ratio: 100 %
Pre FEV6/FVC Ratio: 100 %
RV % pred: 75 %
RV: 1.33 L
TLC % pred: 80 %
TLC: 5.88 L

## 2014-12-03 NOTE — Progress Notes (Signed)
PFT done today. 

## 2014-12-04 ENCOUNTER — Encounter: Payer: Self-pay | Admitting: Adult Health

## 2014-12-04 ENCOUNTER — Ambulatory Visit (INDEPENDENT_AMBULATORY_CARE_PROVIDER_SITE_OTHER): Payer: Managed Care, Other (non HMO) | Admitting: Adult Health

## 2014-12-04 VITALS — BP 118/84 | HR 76 | Temp 97.8°F | Ht 73.0 in | Wt 269.0 lb

## 2014-12-04 DIAGNOSIS — J449 Chronic obstructive pulmonary disease, unspecified: Secondary | ICD-10-CM | POA: Diagnosis not present

## 2014-12-04 NOTE — Assessment & Plan Note (Signed)
Mild COPD in smoker  Despite heavy smoker , PFT remain preserved with no sign airflow obstruction  He is symptomatic which may be due to deconditioning , obesity with heavy smoking .  Will keep on Spiriva for now in hopes to keep flares decreased .

## 2014-12-04 NOTE — Progress Notes (Signed)
Subjective:    Patient ID: Nathaniel [name], male    DOB: 1981-04-22, 34 y.o.   MRN: [id number] PCP Lorayne Marek, MD  HPI  IOV 11/04/2014  Chief Complaint  Patient presents with  . Pulmonary Consult    Pt referred by Dr. Verlon Au for COPD. Pt c/o SOB with activty and rest, mild dry cough. Pt c/o left upper chest pain.    34 year old male accompanied by his wife. He is a poor historian. He has been referred by the hospitalist for COPD evaluation. He was admitted in March 2016 for possible complex migraine. His breathing status is a chronic issue. There are no recent acute issues. He reports a diagnosis of COPD being made in 2009 when he was at [facility] for gallbladder issues. At that time he recollects a pulmonary function test. After that he was placed on Symbicort twice a day but he ran out of this few months or few years later. He has not been on any inhalers other than albuterol for rescue in the last several years. His albuterol usage in the last few years is being 2-4 times per day. He feels his dyspnea is progressively worse the last few to several months. He feels dyspneic even at rest but made worse with exertion. No relieving factors. There is associated wheeze and some dry cough. No sputum production. There are no other aggravating or relieving factors. Beyond this am not able to get a better history from him.  Of note, he says he's had a sleep study done but this is being reviewed by his primary care physician apparently   CXR 10/23/14: Clear    12/04/2014 Follow up : Smoker /COPD  Pt returns for 1 month follow up  Seen last ov for pulmonary consult for COPD  He is a smoker, cessation discussed.  Had PFT yesterday showing with FEV1 79%, ratio 83, FVC 77%, DLCO 100%. No BD response.  Was started on Spiriva last ov with no perceived benefit.  He does get winded easily .Had hospitalization in April with COPD .  We discussed maintenence therapy with hopes of  prevention of COPD flares.  CXR last month clear.  No chest pain, orthopnea, edema or fever. No unintentional wt loss or hemoptysis.  Had sleep study with consult next week with Dr. Elsworth Soho  .     Review of Systems  Constitutional: Negative for fever and unexpected weight change.  HENT: Negative for congestion, dental problem, ear pain, nosebleeds, postnasal drip, rhinorrhea, sinus pressure, sneezing, sore throat and trouble swallowing.   Eyes: Negative for redness and itching.  Respiratory: Positive for shortness of breath . Negative for chest tightness.   Cardiovascular: . Negative for palpitations and leg swelling.  Gastrointestinal: Negative for nausea and vomiting.  Genitourinary: Negative for dysuria.  Musculoskeletal: Negative for joint swelling.  Skin: Negative for rash.  Hematological: Does not bruise/bleed easily.  Psychiatric/Behavioral: Negative for dysphoric mood. The patient is not nervous/anxious.        Objective:   Physical Exam  Constitutional: He is oriented to person, place, and time. He appears well-developed and well-nourished. No distress.  Obese   HENT:  Head: Normocephalic and atraumatic.  Right Ear: External ear normal.  Left Ear: External ear normal.  Mouth/Throat: Oropharynx is clear and moist. No oropharyngeal exudate.  Eyes: Conjunctivae and EOM are normal. Pupils are equal, round, and reactive to light. Right eye exhibits no discharge. Left eye exhibits no discharge. No scleral icterus.  Neck:  Normal range of motion. Neck supple. No JVD present. No tracheal deviation present. No thyromegaly present.  Cardiovascular: Normal rate, regular rhythm and intact distal pulses.  Exam reveals no gallop and no friction rub.   No murmur heard. Pulmonary/Chest: Effort normal and breath sounds normal. No respiratory distress. He has no wheezes. He has no rales. He exhibits no tenderness.  Abdominal: Soft. Bowel sounds are normal. He exhibits no distension and no  mass. There is no tenderness. There is no rebound and no guarding.  Visceral obesity +  Musculoskeletal: Normal range of motion. He exhibits no edema or tenderness.  Lymphadenopathy:    He has no cervical adenopathy.  Neurological: He is alert and oriented to person, place, and time. He has normal reflexes. No cranial nerve deficit. Coordination normal.  Skin: Skin is warm and dry. No rash noted. He is not diaphoretic. No erythema. No pallor.    Marland Kitchen

## 2014-12-04 NOTE — Patient Instructions (Signed)
Number 1 goal is to quit smoking .  Continue on Spiriva daily  Follow up Dr. Chase Caller in 3-4 months and As needed   Follow up with Dr. Elsworth Soho  As planned next week.

## 2014-12-08 ENCOUNTER — Ambulatory Visit (INDEPENDENT_AMBULATORY_CARE_PROVIDER_SITE_OTHER): Payer: Managed Care, Other (non HMO) | Admitting: Pulmonary Disease

## 2014-12-08 ENCOUNTER — Encounter: Payer: Self-pay | Admitting: Pulmonary Disease

## 2014-12-08 VITALS — BP 110/72 | HR 71 | Ht 73.0 in | Wt 270.8 lb

## 2014-12-08 DIAGNOSIS — G4733 Obstructive sleep apnea (adult) (pediatric): Secondary | ICD-10-CM | POA: Diagnosis not present

## 2014-12-08 DIAGNOSIS — Z720 Tobacco use: Secondary | ICD-10-CM

## 2014-12-08 NOTE — Assessment & Plan Note (Addendum)
moderate obstructive sleep apnea  You stopped breathing 14/ hour Your CPAP machine is set at 12 cm Weight loss encouraged, compliance with goal of at least 4 hrs every night is the expectation. Advised against medications with sedative side effects Caution against driving when sleepy - understanding that sleepiness will vary on a day to day basis After discussing pathophysiology of OSA and cardiovascular effects, he seemed a little more motivated to use his CPAP. He will try to use it at least 4 hours every night, we will confirm this with a download in a month's time

## 2014-12-08 NOTE — Progress Notes (Signed)
Subjective:    Patient ID: Nathaniel [name], male    DOB: 01-30-1981, 34 y.o.   MRN: [id number]  HPI 34 year old smoker with bipolar disorder referred for evaluation of OSA   Chief Complaint  Patient presents with  . SLEEP CONSULT    Referred by MR.  Patient had sleep study on 10/28/14, here for results of sleep study.  Epworth Score: 18   He was admitted in March 2016 for possible complex migraine.Marland Kitchen He was discharged with a CPAP machine due to observed apneas. PSG/2016 showed moderate OSA, with AHI of 16/h, REM-related AHI was 48 per hour, lowest desaturation was 82%. This was corrected with CPAP of 12 cm with a nasal mask and REM supine sleep was noted.  Epworth sleepiness score is 18 and a report sleepiness and radius social situations. Denies any problems driving. His main issue is non-refreshing sleep, he also states that his legs hurt all through the night. Bedtime is close to 10 PM, sleep latency is 30-60 minutes, he sleeps on his side with 3 pillows, reports 6-7 nocturnal awakenings including nocturia and is out of bed by 8 AM feeling tired, with occasional headaches and dryness of mouth. His weight has not changed over the last 2 years. There is no history suggestive of cataplexy, sleep paralysis or parasomnias  He admits to poor compliance with CPAP machine, and he states that he has not gotten comfortable on this yet. He was an Cabin [employer], currently unemployed, and has applied for disability. He is cut down smoking to one pack per day  Significant tests/ events PFT  11/2014 with FEV1 79%, ratio 83, FVC 77%, DLCO 100%. No BD response.   Past Medical History  Diagnosis Date  . COPD (chronic obstructive pulmonary disease)   . Bell's palsy   . IBS (irritable bowel syndrome)   . Hypertension   . Bipolar 1 disorder   . Schizophrenia   . Asthma     Past Surgical History  Procedure Laterality Date  . Cholecystectomy    . Abdominal surgery    . Incision and drainage Left  02/05/2013    Dr Rolena Infante  . Knee arthroscopy with medial menisectomy Left 02/05/2013    Procedure: KNEE ARTHROSCOPY  ;  Surgeon: Melina Schools, MD;  Location: [facility];  Service: Orthopedics;  Laterality: Left;  . Irrigation and debridement knee  02/05/2013    Procedure: IRRIGATION AND DEBRIDEMENT KNEE patella bursa;  Surgeon: Melina Schools, MD;  Location: [facility];  Service: Orthopedics;;    Allergies  Allergen Reactions  . Flagyl [Metronidazole]     anaphylaxis  . Lithium Anaphylaxis  . Penicillins Anaphylaxis  . Phenergan [Promethazine Hcl]     seizures  . Vancomycin     Anaphylaxis   . Latex     rash    History   Social History  . Marital Status: Married    Spouse Name: N/A  . Number of Children: 2  . Years of Education: N/A   Occupational History  . Not on file.   Social History Main Topics  . Smoking status: Current Every Day Smoker -- 1.00 packs/day for 14 years    Types: Cigarettes  . Smokeless tobacco: Never Used  . Alcohol Use: No  . Drug Use: No  . Sexual Activity: Not on file   Other Topics Concern  . Not on file   Social History Narrative   ** Merged History Encounter **        Family History  Problem Relation Age of Onset  . Hypertension Mother   . CAD Father     MI in his late 74s  . Stroke Father   . Diabetes Paternal Grandfather   . Pancreatic cancer Maternal Grandmother   . Prostate cancer Father   . Clotting disorder Maternal Grandfather   . Heart disease Maternal Grandfather      Review of Systems  Constitutional: Negative for fever, chills, activity change, appetite change and unexpected weight change.  HENT: Negative for congestion, dental problem, postnasal drip, rhinorrhea, sneezing, sore throat, trouble swallowing and voice change.   Eyes: Negative for visual disturbance.  Respiratory: Negative for cough, choking and shortness of breath.   Cardiovascular: Negative for chest pain and leg swelling.  Gastrointestinal: Negative for  nausea, vomiting and abdominal pain.  Genitourinary: Negative for difficulty urinating.  Musculoskeletal: Negative for arthralgias.  Skin: Negative for rash.  Psychiatric/Behavioral: Negative for behavioral problems and confusion.       Objective:   Physical Exam  Gen. Pleasant, obese, in no distress, normal affect ENT - no lesions, no post nasal drip, class 2-3 airway Neck: No JVD, no thyromegaly, no carotid bruits Lungs: no use of accessory muscles, no dullness to percussion, decreased without rales or rhonchi  Cardiovascular: Rhythm regular, heart sounds  normal, no murmurs or gallops, no peripheral edema Abdomen: soft and non-tender, no hepatosplenomegaly, BS normal. Musculoskeletal: No deformities, no cyanosis or clubbing Neuro:  alert, non focal, no tremors        Assessment & Plan:

## 2014-12-08 NOTE — Assessment & Plan Note (Signed)
Smoking cessation was again emphasized. I feel this is linked to his treatment of bipolar disorder

## 2014-12-08 NOTE — Patient Instructions (Signed)
You have moderate obstructive sleep apnea  You stopped breathing 14/ hour Your CPAP machine is set at 12 cm Weight loss encouraged, compliance with goal of at least 4 hrs every night is the expectation. Advised against medications with sedative side effects Caution against driving when sleepy - understanding that sleepiness will vary on a day to day basis

## 2014-12-28 ENCOUNTER — Emergency Department (HOSPITAL_COMMUNITY)
Admission: EM | Admit: 2014-12-28 | Discharge: 2014-12-28 | Disposition: A | Discharge status: Home / Self Care | Payer: Managed Care, Other (non HMO) | Attending: Emergency Medicine | Admitting: Emergency Medicine

## 2014-12-28 ENCOUNTER — Encounter (HOSPITAL_COMMUNITY): Payer: Self-pay | Admitting: *Deleted

## 2014-12-28 DIAGNOSIS — I10 Essential (primary) hypertension: Secondary | ICD-10-CM | POA: Insufficient documentation

## 2014-12-28 DIAGNOSIS — Z7982 Long term (current) use of aspirin: Secondary | ICD-10-CM | POA: Insufficient documentation

## 2014-12-28 DIAGNOSIS — R51 Headache: Secondary | ICD-10-CM | POA: Diagnosis not present

## 2014-12-28 DIAGNOSIS — Z79899 Other long term (current) drug therapy: Secondary | ICD-10-CM | POA: Diagnosis not present

## 2014-12-28 DIAGNOSIS — Z8719 Personal history of other diseases of the digestive system: Secondary | ICD-10-CM | POA: Insufficient documentation

## 2014-12-28 DIAGNOSIS — Z720 Tobacco use: Secondary | ICD-10-CM | POA: Diagnosis not present

## 2014-12-28 DIAGNOSIS — J449 Chronic obstructive pulmonary disease, unspecified: Secondary | ICD-10-CM | POA: Insufficient documentation

## 2014-12-28 DIAGNOSIS — F319 Bipolar disorder, unspecified: Secondary | ICD-10-CM | POA: Insufficient documentation

## 2014-12-28 DIAGNOSIS — Z91040 Latex allergy status: Secondary | ICD-10-CM | POA: Insufficient documentation

## 2014-12-28 DIAGNOSIS — M542 Cervicalgia: Secondary | ICD-10-CM | POA: Diagnosis present

## 2014-12-28 DIAGNOSIS — Z8669 Personal history of other diseases of the nervous system and sense organs: Secondary | ICD-10-CM | POA: Insufficient documentation

## 2014-12-28 DIAGNOSIS — Z880 Allergy status to penicillin: Secondary | ICD-10-CM | POA: Insufficient documentation

## 2014-12-28 MED ORDER — HYDROCODONE-ACETAMINOPHEN 5-325 MG PO TABS: 1.0000 | ORAL_TABLET | Freq: Four times a day (QID) | ORAL | Status: DC | PRN

## 2014-12-28 MED ORDER — METHOCARBAMOL 500 MG PO TABS: 500.0000 mg | ORAL_TABLET | Freq: Two times a day (BID) | ORAL | Status: DC

## 2014-12-28 NOTE — Discharge Instructions (Signed)
Take pain medication and muscle relaxer as directed as needed for pain.  Do not drive or operate heavy machinery for 4-6 hours after taking medications.

## 2014-12-28 NOTE — ED Provider Notes (Signed)
CSN: [id number]     Arrival date & time 12/28/14  1941 History   None   This chart was scribed for non-physician practitioner, Hyman Bible, PA-C, working with Pamella Pert, MD by Terressa Koyanagi, ED Scribe. This patient was seen in room TR08C/TR08C and the patient's care was started at 9:29 PM.  Chief Complaint  Patient presents with  . Torticollis   The history is provided by the patient. No language interpreter was used.   PCP: [facility]  HPI Comments: Nathaniel [name] is a 34 y.o. male, with PMH noted below, who presents to the Emergency Department complaining of atraumatic, sudden onset, worsening, constant, stabbing/tight/pulling left sided neck pain onset 2 weeks ago. Pt states the pain is radiating "all the way to my head." Pt notes the pain is worse with movement. Pt also complains of associated intermittent left sided headaches. Pt reports taking ibuprofen 3x a day at home without relief. Pt denies any Hx of similar Sx or recent strenuous activity. Pt further denies fever, chills, difficulty swallowing, new numbness or tingling of UE, or vision changes.   Past Medical History  Diagnosis Date  . COPD (chronic obstructive pulmonary disease)   . Bell's palsy   . IBS (irritable bowel syndrome)   . Hypertension   . Bipolar 1 disorder   . Schizophrenia   . Asthma    Past Surgical History  Procedure Laterality Date  . Cholecystectomy    . Abdominal surgery    . Incision and drainage Left 02/05/2013    Dr Rolena Infante  . Knee arthroscopy with medial menisectomy Left 02/05/2013    Procedure: KNEE ARTHROSCOPY  ;  Surgeon: Melina Schools, MD;  Location: [facility];  Service: Orthopedics;  Laterality: Left;  . Irrigation and debridement knee  02/05/2013    Procedure: IRRIGATION AND DEBRIDEMENT KNEE patella bursa;  Surgeon: Melina Schools, MD;  Location: [facility];  Service: Orthopedics;;   Family History  Problem Relation Age of Onset  . Hypertension Mother   . CAD Father     MI in  his late 41s  . Stroke Father   . Diabetes Paternal Grandfather   . Pancreatic cancer Maternal Grandmother   . Prostate cancer Father   . Clotting disorder Maternal Grandfather   . Heart disease Maternal Grandfather    History  Substance Use Topics  . Smoking status: Current Every Day Smoker -- 1.00 packs/day for 14 years    Types: Cigarettes  . Smokeless tobacco: Never Used  . Alcohol Use: No    Review of Systems  Constitutional: Negative for fever and chills.  HENT: Negative for trouble swallowing.   Eyes: Negative for visual disturbance.  Respiratory: Negative for shortness of breath and stridor.   Musculoskeletal: Positive for neck stiffness.  Neurological: Positive for headaches.      Allergies  Flagyl; Lithium; Penicillins; Phenergan; Vancomycin; and Latex  Home Medications   Prior to Admission medications   Medication Sig Start Date End Date Taking? Authorizing Provider  albuterol (PROVENTIL HFA;VENTOLIN HFA) 108 (90 BASE) MCG/ACT inhaler Inhale 2 puffs into the lungs every 6 (six) hours as needed for wheezing or shortness of breath. 03/25/14   Lorayne Marek, MD  aspirin 81 MG chewable tablet Chew 1 tablet (81 mg total) by mouth daily. 10/21/14   Nita Sells, MD  divalproex (DEPAKOTE) 500 MG DR tablet Take 1,000 mg by mouth 2 (two) times daily.     Historical Provider, MD  omeprazole (PRILOSEC) 40 MG capsule Take 1  capsule (40 mg total) by mouth daily. 06/04/14   Irene Shipper, MD  tiotropium (SPIRIVA) 18 MCG inhalation capsule Place 1 capsule (18 mcg total) into inhaler and inhale daily. 11/04/14   Brand Males, MD   Triage Vitals: BP 133/83 mmHg  Pulse 89  Temp(Src) 98.2 F (36.8 C) (Oral)  Resp 18  Ht 6\' 1"  (1.854 m)  Wt 271 lb 4.8 oz (123.061 kg)  BMI 35.80 kg/m2  SpO2 95% Physical Exam  Constitutional: He is oriented to person, place, and time. He appears well-developed and well-nourished. No distress.  HENT:  Head: Normocephalic and atraumatic.   Eyes: Conjunctivae and EOM are normal.  Neck: Neck supple. Muscular tenderness present. No spinous process tenderness present. No erythema present.  No masses palpated. Pain with lateral rotation of the neck to the left. Pain with flexion of the neck.   Cardiovascular: Normal rate.   Pulmonary/Chest: Effort normal. No stridor. No respiratory distress.  Musculoskeletal: Normal range of motion.  Neurological: He is alert and oriented to person, place, and time.  2+ radial pulses; 5/5 grip strength bilaterally, distal sensations of both hands intact   Skin: Skin is warm and dry.  Psychiatric: He has a normal mood and affect. His behavior is normal.  Nursing note and vitals reviewed.   ED Course  Procedures (including critical care time) DIAGNOSTIC STUDIES: Oxygen Saturation is 95% on RA, adequate by my interpretation.    COORDINATION OF CARE: 9:34 PM-Discussed treatment plan which includes pain meds and muscle relaxer with pt at bedside and pt agreed to plan.   Labs Review Labs Reviewed - No data to display  Imaging Review No results found.   EKG Interpretation None      MDM   Final diagnoses:  None   Patient presents today with left sided neck pain.  No known injury or trauma.  Patient afebrile. No signs of infection.  No tenderness to palpation of the spine.  Pain worse with movement.  Pain appears to be muscular.  Neurovascularly intact.  Muscle strength of UE 5/5 bilaterally.  Patient stable for discharge.  Return precautions given.  Patient instructed to follow up with PCP.   Hyman Bible, PA-C 12/29/14 [id number]  Pamella Pert, MD 12/29/14 1228

## 2014-12-28 NOTE — ED Notes (Signed)
Heather, PA at bedside. 

## 2014-12-28 NOTE — ED Notes (Signed)
The pt is c/o a stiff neck for 2 weeks.  No known injury.  He has been taking ibu 3 times a day but that is not helping

## 2015-02-09 ENCOUNTER — Ambulatory Visit: Payer: Self-pay | Admitting: Adult Health

## 2015-03-04 ENCOUNTER — Ambulatory Visit: Payer: Self-pay | Admitting: Adult Health

## 2015-03-12 ENCOUNTER — Emergency Department (HOSPITAL_COMMUNITY)
Admission: EM | Admit: 2015-03-12 | Discharge: 2015-03-13 | Disposition: A | Discharge status: Home / Self Care | Payer: Managed Care, Other (non HMO) | Attending: Emergency Medicine | Admitting: Emergency Medicine

## 2015-03-12 ENCOUNTER — Encounter (HOSPITAL_COMMUNITY): Payer: Self-pay | Admitting: Emergency Medicine

## 2015-03-12 DIAGNOSIS — Z8669 Personal history of other diseases of the nervous system and sense organs: Secondary | ICD-10-CM | POA: Insufficient documentation

## 2015-03-12 DIAGNOSIS — F319 Bipolar disorder, unspecified: Secondary | ICD-10-CM | POA: Insufficient documentation

## 2015-03-12 DIAGNOSIS — M545 Low back pain, unspecified: Secondary | ICD-10-CM

## 2015-03-12 DIAGNOSIS — J449 Chronic obstructive pulmonary disease, unspecified: Secondary | ICD-10-CM | POA: Insufficient documentation

## 2015-03-12 DIAGNOSIS — R109 Unspecified abdominal pain: Secondary | ICD-10-CM

## 2015-03-12 DIAGNOSIS — I10 Essential (primary) hypertension: Secondary | ICD-10-CM | POA: Insufficient documentation

## 2015-03-12 DIAGNOSIS — I2510 Atherosclerotic heart disease of native coronary artery without angina pectoris: Secondary | ICD-10-CM | POA: Insufficient documentation

## 2015-03-12 DIAGNOSIS — R0789 Other chest pain: Secondary | ICD-10-CM | POA: Insufficient documentation

## 2015-03-12 DIAGNOSIS — Z720 Tobacco use: Secondary | ICD-10-CM | POA: Insufficient documentation

## 2015-03-12 DIAGNOSIS — Z79899 Other long term (current) drug therapy: Secondary | ICD-10-CM | POA: Insufficient documentation

## 2015-03-12 DIAGNOSIS — R1084 Generalized abdominal pain: Secondary | ICD-10-CM | POA: Insufficient documentation

## 2015-03-12 DIAGNOSIS — Z880 Allergy status to penicillin: Secondary | ICD-10-CM | POA: Insufficient documentation

## 2015-03-12 DIAGNOSIS — Z7982 Long term (current) use of aspirin: Secondary | ICD-10-CM | POA: Insufficient documentation

## 2015-03-12 DIAGNOSIS — Z8719 Personal history of other diseases of the digestive system: Secondary | ICD-10-CM | POA: Insufficient documentation

## 2015-03-12 DIAGNOSIS — Z91040 Latex allergy status: Secondary | ICD-10-CM | POA: Insufficient documentation

## 2015-03-12 HISTORY — DX: Peripheral vascular disease, unspecified: I73.9

## 2015-03-12 HISTORY — DX: Nicotine dependence, unspecified, uncomplicated: F17.200

## 2015-03-12 HISTORY — DX: Disorder of arteries and arterioles, unspecified: I77.9

## 2015-03-12 NOTE — ED Notes (Signed)
Pt. reports left anterior ribcage pain with SOB worse with deep inspiration onset today , pt. added low back pain , denies cough or diaphoresis , no emesis .

## 2015-03-12 NOTE — ED Provider Notes (Signed)
CSN: [id number]     Arrival date & time 03/12/15  2339 History  This chart was scribed for Linton Flemings, MD by Randa Evens, ED Scribe. This patient was seen in room A05C/A05C and the patient's care was started at 11:59 PM.     Chief Complaint  Patient presents with  . Chest Pain  . Back Pain   The history is provided by the patient. No language interpreter was used.   HPI Comments: BRIGHT SPIELMANN is a 34 y.o. male with PMHx listed below who presents to the Emergency Department complaining of sudden left sided CP onset 1 day prior. Pt states that the pain is radiating down into his abdomen and around into his low back. Pt state that the pain began while he was at rest. Pt states that movement and deep breathing makes the pain worse. Pt states that he has tried ibuprofen and tylenol with no relief. He reports BM 1 hour PTA with no improvement of pain. Denies numbness, weakness, dysuria, fever or chills. Pt reports hx of Cholecystectomy. Pt states that he smokes about 1 pack per day. Pt does report Hx of kidney stones 1 month prior.   Past Medical History  Diagnosis Date  . COPD (chronic obstructive pulmonary disease)   . Bell's palsy   . IBS (irritable bowel syndrome)   . Hypertension   . Bipolar 1 disorder   . Schizophrenia   . Asthma   . Heavy smoker   . Carotid artery disease    Past Surgical History  Procedure Laterality Date  . Cholecystectomy    . Abdominal surgery    . Incision and drainage Left 02/05/2013    Dr Rolena Infante  . Knee arthroscopy with medial menisectomy Left 02/05/2013    Procedure: KNEE ARTHROSCOPY  ;  Surgeon: Melina Schools, MD;  Location: [facility];  Service: Orthopedics;  Laterality: Left;  . Irrigation and debridement knee  02/05/2013    Procedure: IRRIGATION AND DEBRIDEMENT KNEE patella bursa;  Surgeon: Melina Schools, MD;  Location: [facility];  Service: Orthopedics;;   Family History  Problem Relation Age of Onset  . Hypertension Mother   . CAD Father     MI in his  late 15s  . Stroke Father   . Diabetes Paternal Grandfather   . Pancreatic cancer Maternal Grandmother   . Prostate cancer Father   . Clotting disorder Maternal Grandfather   . Heart disease Maternal Grandfather    Social History  Substance Use Topics  . Smoking status: Current Every Day Smoker -- 0.00 packs/day for 14 years    Types: Cigarettes  . Smokeless tobacco: Never Used  . Alcohol Use: No    Review of Systems  Constitutional: Negative for fever and chills.  Cardiovascular: Positive for chest pain.  Gastrointestinal: Positive for abdominal pain.  Genitourinary: Negative for dysuria.  Musculoskeletal: Positive for back pain.  Neurological: Negative for weakness and numbness.  All other systems reviewed and are negative.     Allergies  Flagyl; Lithium; Penicillins; Phenergan; Vancomycin; and Latex  Home Medications   Prior to Admission medications   Medication Sig Start Date End Date Taking? Authorizing Provider  albuterol (PROVENTIL HFA;VENTOLIN HFA) 108 (90 BASE) MCG/ACT inhaler Inhale 2 puffs into the lungs every 6 (six) hours as needed for wheezing or shortness of breath. 03/25/14  Yes Lorayne Marek, MD  aspirin EC 325 MG tablet Take 325 mg by mouth daily.   Yes Historical Provider, MD  divalproex (DEPAKOTE) 500 MG DR  tablet Take 1,000 mg by mouth 2 (two) times daily.    Yes Historical Provider, MD  omeprazole (PRILOSEC) 40 MG capsule Take 1 capsule (40 mg total) by mouth daily. 06/04/14  Yes Irene Shipper, MD  aspirin 81 MG chewable tablet Chew 1 tablet (81 mg total) by mouth daily. Patient not taking: Reported on 03/13/2015 10/21/14   Nita Sells, MD  HYDROcodone-acetaminophen (NORCO/VICODIN) 5-325 MG per tablet Take 1-2 tablets by mouth every 6 (six) hours as needed. Patient not taking: Reported on 03/13/2015 12/28/14   Hyman Bible, PA-C  methocarbamol (ROBAXIN) 500 MG tablet Take 1 tablet (500 mg total) by mouth 2 (two) times daily. Patient not taking:  Reported on 03/13/2015 12/28/14   Hyman Bible, PA-C  tiotropium (SPIRIVA) 18 MCG inhalation capsule Place 1 capsule (18 mcg total) into inhaler and inhale daily. Patient not taking: Reported on 03/13/2015 11/04/14   Brand Males, MD   BP 124/66 mmHg  Pulse 80  Temp(Src) 98.1 F (36.7 C) (Oral)  Resp 20  SpO2 96%   Physical Exam  Constitutional: He is oriented to person, place, and time. He appears well-developed and well-nourished.  HENT:  Head: Normocephalic and atraumatic.  Nose: Nose normal.  Mouth/Throat: Oropharynx is clear and moist.  Eyes: Conjunctivae and EOM are normal. Pupils are equal, round, and reactive to light.  Neck: Normal range of motion. Neck supple. No JVD present. No tracheal deviation present. No thyromegaly present.  Cardiovascular: Normal rate, regular rhythm, normal heart sounds and intact distal pulses.  Exam reveals no gallop and no friction rub.   No murmur heard. Pulmonary/Chest: Effort normal and breath sounds normal. No stridor. No respiratory distress. He has no wheezes. He has no rales. He exhibits tenderness (diffuse tenderness over the left side of chest without crepitus).  Abdominal: Soft. Bowel sounds are normal. He exhibits no distension and no mass. There is tenderness (diffuse tenderness throughout the abdomen without rebound or guarding.  Normal bowel sounds). There is no rebound and no guarding.  Musculoskeletal: Normal range of motion. He exhibits no edema or tenderness.  Diffuse tenderness across lower lumbar region.  No step-off or crepitus  Lymphadenopathy:    He has no cervical adenopathy.  Neurological: He is alert and oriented to person, place, and time. He displays normal reflexes. He exhibits normal muscle tone. Coordination normal.  Skin: Skin is warm and dry. No rash noted. No erythema. No pallor.  Psychiatric: He has a normal mood and affect. His behavior is normal. Judgment and thought content normal.  Nursing note and vitals  reviewed.   ED Course  Procedures (including critical care time) DIAGNOSTIC STUDIES: Oxygen Saturation is 98% on RA, normal by my interpretation.    COORDINATION OF CARE: 12:18 AM-Discussed treatment plan with pt at bedside and pt agreed to plan.     Labs Review Labs Reviewed  BASIC METABOLIC PANEL - Abnormal; Notable for the following:    Potassium 3.2 (*)    Calcium 8.7 (*)    All other components within normal limits  URINALYSIS, ROUTINE W REFLEX MICROSCOPIC (NOT AT [facility]) - Abnormal; Notable for the following:    Color, Urine AMBER (*)    APPearance CLOUDY (*)    Specific Gravity, Urine 1.042 (*)    Bilirubin Urine SMALL (*)    Ketones, ur 15 (*)    Protein, ur 30 (*)    All other components within normal limits  HEPATIC FUNCTION PANEL - Abnormal; Notable for the following:  Total Protein 5.9 (*)    Albumin 3.3 (*)    Bilirubin, Direct <0.1 (*)    All other components within normal limits  CBC  D-DIMER, QUANTITATIVE (NOT AT [facility])  LIPASE, BLOOD  URINE MICROSCOPIC-ADD ON  I-STAT TROPOININ, ED  I-STAT CG4 LACTIC ACID, ED    Imaging Review Dg Chest 2 View  03/13/2015   CLINICAL DATA:  Chest pain and shortness of breath  EXAM: CHEST  2 VIEW  COMPARISON:  01/17/2015  FINDINGS: The heart size and mediastinal contours are within normal limits. Both lungs are clear. The visualized skeletal structures are unremarkable.  IMPRESSION: No active cardiopulmonary disease.   Electronically Signed   By: Lucienne Capers M.D.   On: 03/13/2015 00:24   I have personally reviewed and evaluated these images and lab results as part of my medical decision-making.   EKG Interpretation   Date/Time:  Thursday March 12 2015 23:43:51 EDT Ventricular Rate:  85 PR Interval:  136 QRS Duration: 96 QT Interval:  346 QTC Calculation: 411 R Axis:   16 Text Interpretation:  Normal sinus rhythm with sinus arrhythmia Incomplete  right bundle branch block Borderline ECG No significant change  since last  tracing Confirmed by Jewel Mcafee  MD, Chau Sawin ([id number]) on 03/12/2015 11:57:49 PM      MDM   Final diagnoses:  Bilateral low back pain without sciatica  Atypical chest pain     I personally performed the services described in this documentation, which was scribed in my presence. The recorded information has been reviewed and is accurate.  34 year old male with onset of low back pain radiating around abdomen and into left chest starting this evening.  Patient is tender to palpation over abdomen, chest and back.  EKG unremarkable.  Patient has history of similar pain in the past.  He has had multiple CT scans, and is actually been flagged for further radiation concern.  Plan for chest x-ray and lab work.  Chest x-ray and lab work unremarkable.  Patient still with persistent pain, will add on 2 view abdomen and further pain medicine  Workup here without significant findings.  Will refer to primary care Dr. for further management of ongoing issues.     Linton Flemings, MD 03/13/15 [phone]

## 2015-03-13 ENCOUNTER — Emergency Department (HOSPITAL_COMMUNITY): Payer: Managed Care, Other (non HMO)

## 2015-03-13 LAB — CBC
HCT: 42.8 % (ref 39.0–52.0)
Hemoglobin: 14.9 g/dL (ref 13.0–17.0)
MCH: 30.7 pg (ref 26.0–34.0)
MCHC: 34.8 g/dL (ref 30.0–36.0)
MCV: 88.2 fL (ref 78.0–100.0)
PLATELETS: 180 10*3/uL (ref 150–400)
RBC: 4.85 MIL/uL (ref 4.22–5.81)
RDW: 12.4 % (ref 11.5–15.5)
WBC: 5.9 10*3/uL (ref 4.0–10.5)

## 2015-03-13 LAB — BASIC METABOLIC PANEL
Anion gap: 9 (ref 5–15)
BUN: 7 mg/dL (ref 6–20)
CALCIUM: 8.7 mg/dL — AB (ref 8.9–10.3)
CHLORIDE: 107 mmol/L (ref 101–111)
CO2: 26 mmol/L (ref 22–32)
CREATININE: 1.08 mg/dL (ref 0.61–1.24)
GFR calc non Af Amer: >60 mL/min (ref >60)
Glucose, Bld: 92 mg/dL (ref 65–99)
Potassium: 3.2 mmol/L — ABNORMAL LOW (ref 3.5–5.1)
SODIUM: 142 mmol/L (ref 135–145)

## 2015-03-13 LAB — URINE MICROSCOPIC-ADD ON

## 2015-03-13 LAB — URINALYSIS, ROUTINE W REFLEX MICROSCOPIC
GLUCOSE, UA: NEGATIVE mg/dL
HGB URINE DIPSTICK: NEGATIVE
Ketones, ur: 15 mg/dL — AB
LEUKOCYTES UA: NEGATIVE
Nitrite: NEGATIVE
PH: 6 (ref 5.0–8.0)
PROTEIN: 30 mg/dL — AB
SPECIFIC GRAVITY, URINE: 1.042 — AB (ref 1.005–1.030)
Urobilinogen, UA: 1 mg/dL (ref 0.0–1.0)

## 2015-03-13 LAB — I-STAT TROPONIN, ED: TROPONIN I, POC: 0 ng/mL (ref 0.00–0.08)

## 2015-03-13 LAB — LIPASE, BLOOD: Lipase: 23 U/L (ref 22–51)

## 2015-03-13 LAB — HEPATIC FUNCTION PANEL
ALBUMIN: 3.3 g/dL — AB (ref 3.5–5.0)
ALK PHOS: 64 U/L (ref 38–126)
ALT: 17 U/L (ref 17–63)
AST: 23 U/L (ref 15–41)
BILIRUBIN TOTAL: 0.4 mg/dL (ref 0.3–1.2)
Total Protein: 5.9 g/dL — ABNORMAL LOW (ref 6.5–8.1)

## 2015-03-13 LAB — I-STAT CG4 LACTIC ACID, ED: Lactic Acid, Venous: 1.78 mmol/L (ref 0.5–2.0)

## 2015-03-13 LAB — D-DIMER, QUANTITATIVE: D-Dimer, Quant: <0.27 ug/mL-FEU (ref 0.00–0.48)

## 2015-03-13 MED ORDER — DIAZEPAM 5 MG/ML IJ SOLN
2.5000 mg | Freq: Once | INTRAMUSCULAR | Status: AC
  Administered 2015-03-13: 2.5 mg via INTRAVENOUS
  Filled 2015-03-13: qty 2

## 2015-03-13 MED ORDER — OXYCODONE-ACETAMINOPHEN 5-325 MG PO TABS: 2.0000 | ORAL_TABLET | ORAL | Status: DC | PRN

## 2015-03-13 MED ORDER — METHOCARBAMOL 500 MG PO TABS
1000.0000 mg | ORAL_TABLET | Freq: Once | ORAL | Status: AC
Start: 2015-03-13 — End: 2015-03-13
  Administered 2015-03-13: 1000 mg via ORAL
  Filled 2015-03-13: qty 2

## 2015-03-13 MED ORDER — METHOCARBAMOL 500 MG PO TABS: 500.0000 mg | ORAL_TABLET | Freq: Three times a day (TID) | ORAL | Status: DC | PRN

## 2015-03-13 MED ORDER — OXYCODONE-ACETAMINOPHEN 5-325 MG PO TABS
2.0000 | ORAL_TABLET | Freq: Once | ORAL | Status: AC
  Administered 2015-03-13: 2 via ORAL
  Filled 2015-03-13: qty 2

## 2015-03-13 MED ORDER — MORPHINE SULFATE (PF) 4 MG/ML IV SOLN
4.0000 mg | Freq: Once | INTRAVENOUS | Status: AC
  Administered 2015-03-13: 4 mg via INTRAVENOUS
  Filled 2015-03-13: qty 1

## 2015-03-13 MED ORDER — KETOROLAC TROMETHAMINE 30 MG/ML IJ SOLN
30.0000 mg | Freq: Once | INTRAMUSCULAR | Status: AC
  Administered 2015-03-13: 30 mg via INTRAVENOUS
  Filled 2015-03-13: qty 1

## 2015-03-13 NOTE — Discharge Instructions (Signed)
Back Exercises Back exercises help treat and prevent back injuries. The goal is to increase your strength in your belly (abdominal) and back muscles. These exercises can also help with flexibility. Start these exercises when told by your doctor. HOME CARE Back exercises include: Pelvic Tilt.  Lie on your back with your knees bent. Tilt your pelvis until the lower part of your back is against the floor. Hold this position 5 to 10 sec. Repeat this exercise 5 to 10 times. Knee to Chest.  Pull 1 knee up against your chest and hold for 20 to 30 seconds. Repeat this with the other knee. This may be done with the other leg straight or bent, whichever feels better. Then, pull both knees up against your chest. Sit-Ups or Curl-Ups.  Bend your knees 90 degrees. Start with tilting your pelvis, and do a partial, slow sit-up. Only lift your upper half 30 to 45 degrees off the floor. Take at least 2 to 3 seonds for each sit-up. Do not do sit-ups with your knees out straight. If partial sit-ups are difficult, simply do the above but with only tightening your belly (abdominal) muscles and holding it as told. Hip-Lift.  Lie on your back with your knees flexed 90 degrees. Push down with your feet and shoulders as you raise your hips 2 inches off the floor. Hold for 10 seconds, repeat 5 to 10 times. Back Arches.  Lie on your stomach. Prop yourself up on bent elbows. Slowly press on your hands, causing an arch in your low back. Repeat 3 to 5 times. Shoulder-Lifts.  Lie face down with arms beside your body. Keep hips and belly pressed to floor as you slowly lift your head and shoulders off the floor. Do not overdo your exercises. Be careful in the beginning. Exercises may cause you some mild back discomfort. If the pain lasts for more than 15 minutes, stop the exercises until you see your doctor. Improvement with exercise for back problems is slow.  Document Released: 08/06/2010 Document Revised: 09/26/2011  Document Reviewed: 05/05/2011 [facility] Patient Information 2015 [place], [place]. This information is not intended to replace advice given to you by your health care provider. Make sure you discuss any questions you have with your health care provider.  Back Pain, Adult Back pain is very common. The pain often gets better over time. The cause of back pain is usually not dangerous. Most people can learn to manage their back pain on their own.  HOME CARE   Stay active. Start with short walks on flat ground if you can. Try to walk farther each day.  Do not sit, drive, or stand in one place for more than 30 minutes. Do not stay in bed.  Do not avoid exercise or work. Activity can help your back heal faster.  Be careful when you bend or lift an object. Bend at your knees, keep the object close to you, and do not twist.  Sleep on a firm mattress. Lie on your side, and bend your knees. If you lie on your back, put a pillow under your knees.  Only take medicines as told by your doctor.  Put ice on the injured area.  Put ice in a plastic bag.  Place a towel between your skin and the bag.  Leave the ice on for 15-20 minutes, 03-04 times a day for the first 2 to 3 days. After that, you can switch between ice and heat packs.  Ask your doctor about back exercises  or massage.  Avoid feeling anxious or stressed. Find good ways to deal with stress, such as exercise. GET HELP RIGHT AWAY IF:   Your pain does not go away with rest or medicine.  Your pain does not go away in 1 week.  You have new problems.  You do not feel well.  The pain spreads into your legs.  You cannot control when you poop (bowel movement) or pee (urinate).  Your arms or legs feel weak or lose feeling (numbness).  You feel sick to your stomach (nauseous) or throw up (vomit).  You have belly (abdominal) pain.  You feel like you may pass out (faint). MAKE SURE YOU:   Understand these instructions.  Will watch  your condition.  Will get help right away if you are not doing well or get worse. Document Released: 12/21/2007 Document Revised: 09/26/2011 Document Reviewed: 11/05/2013 [facility] Patient Information 2015 [place], [place]. This information is not intended to replace advice given to you by your health care provider. Make sure you discuss any questions you have with your health care provider.  Chest Pain (Nonspecific) It is often hard to give a diagnosis for the cause of chest pain. There is always a chance that your pain could be related to something serious, such as a heart attack or a blood clot in the lungs. You need to follow up with your doctor. HOME CARE  If antibiotic medicine was given, take it as directed by your doctor. Finish the medicine even if you start to feel better.  For the next few days, avoid activities that bring on chest pain. Continue physical activities as told by your doctor.  Do not use any tobacco products. This includes cigarettes, chewing tobacco, and e-cigarettes.  Avoid drinking alcohol.  Only take medicine as told by your doctor.  Follow your doctor's suggestions for more testing if your chest pain does not go away.  Keep all doctor visits you made. GET HELP IF:  Your chest pain does not go away, even after treatment.  You have a rash with blisters on your chest.  You have a fever. GET HELP RIGHT AWAY IF:   You have more pain or pain that spreads to your arm, neck, jaw, back, or belly (abdomen).  You have shortness of breath.  You cough more than usual or cough up blood.  You have very bad back or belly pain.  You feel sick to your stomach (nauseous) or throw up (vomit).  You have very bad weakness.  You pass out (faint).  You have chills. This is an emergency. Do not wait to see if the problems will go away. Call your local emergency services (911 in [place] Do not drive yourself to the hospital. MAKE SURE YOU:   Understand these  instructions.  Will watch your condition.  Will get help right away if you are not doing well or get worse. Document Released: 12/21/2007 Document Revised: 07/09/2013 Document Reviewed: 12/21/2007 [facility] Patient Information 2015 [place], [place]. This information is not intended to replace advice given to you by your health care provider. Make sure you discuss any questions you have with your health care provider.

## 2015-03-13 NOTE — ED Notes (Signed)
Patient left at this time with all belongings. 

## 2015-03-16 ENCOUNTER — Ambulatory Visit (INDEPENDENT_AMBULATORY_CARE_PROVIDER_SITE_OTHER): Payer: Self-pay | Admitting: Adult Health

## 2015-03-16 ENCOUNTER — Encounter: Payer: Self-pay | Admitting: Adult Health

## 2015-03-16 VITALS — BP 116/88 | HR 79 | Temp 98.1°F | Ht 73.0 in | Wt 263.0 lb

## 2015-03-16 DIAGNOSIS — G4733 Obstructive sleep apnea (adult) (pediatric): Secondary | ICD-10-CM

## 2015-03-16 NOTE — Progress Notes (Signed)
   Subjective:    Patient ID: Nathaniel [name], male    DOB: [birth year], 34 y.o.   MRN: [id number]  HPI 34 yo male with OSA   Significant tests/ events PFT 11/2014 with FEV1 79%, ratio 83, FVC 77%, DLCO 100%. No BD response.  PSG/2016 showed moderate OSA, with AHI of 16/h, REM-related AHI was 48 per hour, lowest desaturation was 82%. This was corrected with CPAP of 12 cm with a nasal mask and REM supine sleep was noted.  03/16/2015 Follow up : OSA  Pt returns for follow up for moderate sleep apnea.  He is suppose to be on CPAP At bedtime  .  However he is not wearing .  Download shows only 2 days out of last 30 days that he wore machine/mask.  He says pulls off mask in middle of  Night.  We discussed compliance and dangers of untreated OSA>  We discussed mask issues and head strap tightness.  Talked about CPAP titration however he wants to try to work on it on his own for a while longer.  Does have daytime sleepiness.   Review of Systems Constitutional:   No  weight loss, night sweats,  Fevers, chills, + fatigue, or  lassitude.  HEENT:   No headaches,  Difficulty swallowing,  Tooth/dental problems, or  Sore throat,                No sneezing, itching, ear ache, nasal congestion, post nasal drip,   CV:  No chest pain,  Orthopnea, PND, swelling in lower extremities, anasarca, dizziness, palpitations, syncope.   GI  No heartburn, indigestion, abdominal pain, nausea, vomiting, diarrhea, change in bowel habits, loss of appetite, bloody stools.   Resp: No shortness of breath with exertion or at rest.  No excess mucus, no productive cough,  No non-productive cough,  No coughing up of blood.  No change in color of mucus.  No wheezing.  No chest wall deformity  Skin: no rash or lesions.  GU: no dysuria, change in color of urine, no urgency or frequency.  No flank pain, no hematuria   MS:  No joint pain or swelling.  No decreased range of motion.  No back pain.  Psych:  No change in mood  or affect. No depression or anxiety.  No memory loss.         Objective:   Physical Exam GEN: A/Ox3; pleasant , NAD, well nourished , obese   HEENT:  [place]/AT,  EACs-clear, TMs-wnl, NOSE-clear, THROAT-clear, no lesions, no postnasal drip or exudate noted. Class 2-3 airway   NECK:  Supple w/ fair ROM; no JVD; normal carotid impulses w/o bruits; no thyromegaly or nodules palpated; no lymphadenopathy.  RESP  Clear  P & A; w/o, wheezes/ rales/ or rhonchi.no accessory muscle use, no dullness to percussion  CARD:  RRR, no m/r/g  , no peripheral edema, pulses intact, no cyanosis or clubbing.  GI:   Soft & nt; nml bowel sounds; no organomegaly or masses detected.  Musco: Warm bil, no deformities or joint swelling noted.   Neuro: alert, no focal deficits noted.    Skin: Warm, no lesions or rashes         Assessment & Plan:

## 2015-03-16 NOTE — Patient Instructions (Signed)
Wear CPAP At bedtime  -goal is to wear at least 4-6hr each night .  If still having mask issues, call us back . We can order new mask.  Work on weight loss.  CPAP Download in 1 month  follow up Dr. Elsworth Soho  In 2-3 months and As needed

## 2015-03-17 NOTE — Assessment & Plan Note (Signed)
Moderate OSA with CPAP noncompliance  Encouragement provided with pt education on CPAP mask and OSA  Plan  Wear CPAP At bedtime  -goal is to wear at least 4-6hr each night .  If still having mask issues, call us back . We can order new mask.  Work on weight loss.  CPAP Download in 1 month  follow up Dr. Elsworth Soho  In 2-3 months and As needed

## 2015-03-17 NOTE — Assessment & Plan Note (Signed)
Encouraged to work on weight loss 

## 2015-03-19 NOTE — Progress Notes (Signed)
Reviewed & agree with plan  

## 2015-04-08 ENCOUNTER — Other Ambulatory Visit: Payer: Self-pay | Admitting: Internal Medicine

## 2015-04-13 ENCOUNTER — Telehealth: Payer: Self-pay

## 2015-04-13 DIAGNOSIS — R062 Wheezing: Secondary | ICD-10-CM

## 2015-04-13 MED ORDER — ALBUTEROL SULFATE HFA 108 (90 BASE) MCG/ACT IN AERS: 2.0000 | INHALATION_SPRAY | Freq: Four times a day (QID) | RESPIRATORY_TRACT | Status: DC | PRN

## 2015-04-13 NOTE — Telephone Encounter (Signed)
Patient called requesting a refill on his inhaler Patient has not been here in a year Prescription for inhaler sent to pharmacy with no refills until Patient sees his doctor] Patient understood he needs to make an appt

## 2015-04-16 ENCOUNTER — Ambulatory Visit: Payer: Self-pay | Admitting: Internal Medicine

## 2015-04-29 ENCOUNTER — Emergency Department (HOSPITAL_COMMUNITY)
Admission: EM | Admit: 2015-04-29 | Discharge: 2015-04-30 | Disposition: A | Discharge status: Home / Self Care | Payer: Self-pay | Attending: Emergency Medicine | Admitting: Emergency Medicine

## 2015-04-29 ENCOUNTER — Emergency Department (HOSPITAL_COMMUNITY): Payer: Self-pay

## 2015-04-29 DIAGNOSIS — R079 Chest pain, unspecified: Secondary | ICD-10-CM | POA: Insufficient documentation

## 2015-04-29 DIAGNOSIS — Z91040 Latex allergy status: Secondary | ICD-10-CM | POA: Insufficient documentation

## 2015-04-29 DIAGNOSIS — R0602 Shortness of breath: Secondary | ICD-10-CM

## 2015-04-29 DIAGNOSIS — Z8719 Personal history of other diseases of the digestive system: Secondary | ICD-10-CM | POA: Insufficient documentation

## 2015-04-29 DIAGNOSIS — Z9049 Acquired absence of other specified parts of digestive tract: Secondary | ICD-10-CM | POA: Insufficient documentation

## 2015-04-29 DIAGNOSIS — I10 Essential (primary) hypertension: Secondary | ICD-10-CM | POA: Insufficient documentation

## 2015-04-29 DIAGNOSIS — Z7952 Long term (current) use of systemic steroids: Secondary | ICD-10-CM | POA: Insufficient documentation

## 2015-04-29 DIAGNOSIS — I2510 Atherosclerotic heart disease of native coronary artery without angina pectoris: Secondary | ICD-10-CM | POA: Insufficient documentation

## 2015-04-29 DIAGNOSIS — R109 Unspecified abdominal pain: Secondary | ICD-10-CM | POA: Insufficient documentation

## 2015-04-29 DIAGNOSIS — Z7982 Long term (current) use of aspirin: Secondary | ICD-10-CM | POA: Insufficient documentation

## 2015-04-29 DIAGNOSIS — Z880 Allergy status to penicillin: Secondary | ICD-10-CM | POA: Insufficient documentation

## 2015-04-29 DIAGNOSIS — F319 Bipolar disorder, unspecified: Secondary | ICD-10-CM | POA: Insufficient documentation

## 2015-04-29 DIAGNOSIS — J441 Chronic obstructive pulmonary disease with (acute) exacerbation: Secondary | ICD-10-CM | POA: Insufficient documentation

## 2015-04-29 DIAGNOSIS — M791 Myalgia, unspecified site: Secondary | ICD-10-CM

## 2015-04-29 DIAGNOSIS — Z79899 Other long term (current) drug therapy: Secondary | ICD-10-CM | POA: Insufficient documentation

## 2015-04-29 DIAGNOSIS — R058 Other specified cough: Secondary | ICD-10-CM

## 2015-04-29 DIAGNOSIS — Z8669 Personal history of other diseases of the nervous system and sense organs: Secondary | ICD-10-CM | POA: Insufficient documentation

## 2015-04-29 DIAGNOSIS — Z720 Tobacco use: Secondary | ICD-10-CM | POA: Insufficient documentation

## 2015-04-29 DIAGNOSIS — R05 Cough: Secondary | ICD-10-CM

## 2015-04-29 LAB — LIPASE, BLOOD: Lipase: 20 U/L — ABNORMAL LOW (ref 22–51)

## 2015-04-29 LAB — COMPREHENSIVE METABOLIC PANEL
ALBUMIN: 3.8 g/dL (ref 3.5–5.0)
ALK PHOS: 70 U/L (ref 38–126)
ALT: 24 U/L (ref 17–63)
ANION GAP: 12 (ref 5–15)
AST: 16 U/L (ref 15–41)
BILIRUBIN TOTAL: 0.7 mg/dL (ref 0.3–1.2)
BUN: 9 mg/dL (ref 6–20)
CALCIUM: 9.1 mg/dL (ref 8.9–10.3)
CO2: 25 mmol/L (ref 22–32)
CREATININE: 0.94 mg/dL (ref 0.61–1.24)
Chloride: 98 mmol/L — ABNORMAL LOW (ref 101–111)
GFR calc Af Amer: >60 mL/min (ref >60)
GFR calc non Af Amer: >60 mL/min (ref >60)
GLUCOSE: 88 mg/dL (ref 65–99)
Potassium: 3.7 mmol/L (ref 3.5–5.1)
Sodium: 135 mmol/L (ref 135–145)
TOTAL PROTEIN: 6.5 g/dL (ref 6.5–8.1)

## 2015-04-29 LAB — CBC
HCT: 44.4 % (ref 39.0–52.0)
HEMOGLOBIN: 15.8 g/dL (ref 13.0–17.0)
MCH: 30.7 pg (ref 26.0–34.0)
MCHC: 35.6 g/dL (ref 30.0–36.0)
MCV: 86.2 fL (ref 78.0–100.0)
PLATELETS: 269 10*3/uL (ref 150–400)
RBC: 5.15 MIL/uL (ref 4.22–5.81)
RDW: 12.2 % (ref 11.5–15.5)
WBC: 8.7 10*3/uL (ref 4.0–10.5)

## 2015-04-29 MED ORDER — ASPIRIN 81 MG PO CHEW: 324.0000 mg | CHEWABLE_TABLET | Freq: Once | ORAL | Status: DC

## 2015-04-29 MED ORDER — METHYLPREDNISOLONE SODIUM SUCC 125 MG IJ SOLR
125.0000 mg | Freq: Once | INTRAMUSCULAR | Status: AC
  Administered 2015-04-30: 125 mg via INTRAVENOUS
  Filled 2015-04-29: qty 2

## 2015-04-29 MED ORDER — ACETAMINOPHEN 325 MG PO TABS
325.0000 mg | ORAL_TABLET | Freq: Once | ORAL | Status: AC
  Administered 2015-04-30: 325 mg via ORAL
  Filled 2015-04-29: qty 1

## 2015-04-29 MED ORDER — ALBUTEROL SULFATE (2.5 MG/3ML) 0.083% IN NEBU
5.0000 mg | INHALATION_SOLUTION | Freq: Once | RESPIRATORY_TRACT | Status: AC
  Administered 2015-04-29: 5 mg via RESPIRATORY_TRACT
  Filled 2015-04-29: qty 6

## 2015-04-29 MED ORDER — ALBUTEROL SULFATE (2.5 MG/3ML) 0.083% IN NEBU: 5.0000 mg | INHALATION_SOLUTION | Freq: Once | RESPIRATORY_TRACT | Status: DC

## 2015-04-29 MED ORDER — IPRATROPIUM BROMIDE 0.02 % IN SOLN
0.5000 mg | Freq: Once | RESPIRATORY_TRACT | Status: AC
  Administered 2015-04-29: 0.5 mg via RESPIRATORY_TRACT
  Filled 2015-04-29: qty 2.5

## 2015-04-29 NOTE — ED Notes (Addendum)
Onset one week ago general body achy, shortness of breath, productive cough yellow green.  General body achy 9/10. States developed diarrhea one day ago.
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
## 2015-04-30 LAB — URINALYSIS, ROUTINE W REFLEX MICROSCOPIC
Bilirubin Urine: NEGATIVE
Glucose, UA: NEGATIVE mg/dL
HGB URINE DIPSTICK: NEGATIVE
Ketones, ur: NEGATIVE mg/dL
Leukocytes, UA: NEGATIVE
NITRITE: NEGATIVE
Protein, ur: NEGATIVE mg/dL
SPECIFIC GRAVITY, URINE: 1.02 (ref 1.005–1.030)
UROBILINOGEN UA: 0.2 mg/dL (ref 0.0–1.0)
pH: 7 (ref 5.0–8.0)

## 2015-04-30 LAB — I-STAT TROPONIN, ED: TROPONIN I, POC: 0 ng/mL (ref 0.00–0.08)

## 2015-04-30 LAB — BRAIN NATRIURETIC PEPTIDE: B NATRIURETIC PEPTIDE 5: 45.6 pg/mL (ref 0.0–100.0)

## 2015-04-30 MED ORDER — OXYCODONE-ACETAMINOPHEN 5-325 MG PO TABS
1.0000 | ORAL_TABLET | Freq: Once | ORAL | Status: AC
  Administered 2015-04-30: 2 via ORAL
  Filled 2015-04-30: qty 2

## 2015-04-30 MED ORDER — OXYCODONE-ACETAMINOPHEN 5-325 MG PO TABS: 1.0000 | ORAL_TABLET | ORAL | Status: DC | PRN

## 2015-04-30 MED ORDER — PREDNISONE 10 MG PO TABS: 40.0000 mg | ORAL_TABLET | Freq: Every day | ORAL | Status: DC

## 2015-04-30 MED ORDER — ONDANSETRON 4 MG PO TBDP: 4.0000 mg | ORAL_TABLET | Freq: Three times a day (TID) | ORAL | Status: DC | PRN

## 2015-04-30 MED ORDER — ALBUTEROL SULFATE HFA 108 (90 BASE) MCG/ACT IN AERS
2.0000 | INHALATION_SPRAY | Freq: Once | RESPIRATORY_TRACT | Status: AC
  Administered 2015-04-30: 2 via RESPIRATORY_TRACT
  Filled 2015-04-30: qty 6.7

## 2015-04-30 NOTE — Discharge Instructions (Signed)
You have been seen tonight for multiple complaints including generalized body aches, shortness of breath, and GI issues. Your imaging and labs showed no abnormalities. You are being discharged with Prednisone, a steroid, as well as an anti-nausea medication. You have also been given some pain medication to be used as needed. It is recommended you followup with your Pulmonologist within 3 days.   Chronic Obstructive Pulmonary Disease  Chronic obstructive pulmonary disease (COPD) is a common lung condition in which airflow from the lungs is limited. COPD is a general term that can be used to describe many different lung problems that limit airflow, including both chronic bronchitis and emphysema. If you have COPD, your lung function will probably never return to normal, but there are measures you can take to improve lung function and make yourself feel better.  CAUSES  Smoking (common).  Exposure to secondhand smoke.  Genetic problems.  Chronic inflammatory lung diseases or recurrent infections. SYMPTOMS  Shortness of breath, especially with physical activity.  Deep, persistent (chronic) cough with a large amount of thick mucus.  Wheezing.  Rapid breaths (tachypnea).  Gray or bluish discoloration (cyanosis) of the skin, especially in your fingers, toes, or lips.  Fatigue.  Weight loss.  Frequent infections or episodes when breathing symptoms become much worse (exacerbations).  Chest tightness. DIAGNOSIS  Your health care provider will take a medical history and perform a physical examination to diagnose COPD. Additional tests for COPD may include:  Lung (pulmonary) function tests.  Chest X-ray.  CT scan.  Blood tests. TREATMENT  Treatment for COPD may include:  Inhaler and nebulizer medicines. These help manage the symptoms of COPD and make your breathing more comfortable.  Supplemental oxygen. Supplemental oxygen is only helpful if you have a low oxygen level in your blood.  Exercise and  physical activity. These are beneficial for nearly all people with COPD.  Lung surgery or transplant.  Nutrition therapy to gain weight, if you are underweight.  Pulmonary rehabilitation. This may involve working with a team of health care providers and specialists, such as respiratory, occupational, and physical therapists. HOME CARE INSTRUCTIONS  Take all medicines (inhaled or pills) as directed by your health care provider.  Avoid over-the-counter medicines or cough syrups that dry up your airway (such as antihistamines) and slow down the elimination of secretions unless instructed otherwise by your health care provider.  If you are a smoker, the most important thing that you can do is stop smoking. Continuing to smoke will cause further lung damage and breathing trouble. Ask your health care provider for help with quitting smoking. He or she can direct you to community resources or hospitals that provide support.  Avoid exposure to irritants such as smoke, chemicals, and fumes that aggravate your breathing.  Use oxygen therapy and pulmonary rehabilitation if directed by your health care provider. If you require home oxygen therapy, ask your health care provider whether you should purchase a pulse oximeter to measure your oxygen level at home.  Avoid contact with individuals who have a contagious illness.  Avoid extreme temperature and humidity changes.  Eat healthy foods. Eating smaller, more frequent meals and resting before meals may help you maintain your strength.  Stay active, but balance activity with periods of rest. Exercise and physical activity will help you maintain your ability to do things you want to do.  Preventing infection and hospitalization is very important when you have COPD. Make sure to receive all the vaccines your health care provider recommends,  especially the pneumococcal and influenza vaccines. Ask your health care provider whether you need a pneumonia vaccine.  Learn  and use relaxation techniques to manage stress.  Learn and use controlled breathing techniques as directed by your health care provider. Controlled breathing techniques include:  Pursed lip breathing. Start by breathing in (inhaling) through your nose for 1 second. Then, purse your lips as if you were going to whistle and breathe out (exhale) through the pursed lips for 2 seconds.  Diaphragmatic breathing. Start by putting one hand on your abdomen just above your waist. Inhale slowly through your nose. The hand on your abdomen should move out. Then purse your lips and exhale slowly. You should be able to feel the hand on your abdomen moving in as you exhale. Learn and use controlled coughing to clear mucus from your lungs. Controlled coughing is a series of short, progressive coughs. The steps of controlled coughing are: 1. Lean your head slightly forward. 2. Breathe in deeply using diaphragmatic breathing. 3. Try to hold your breath for 3 seconds. 4. Keep your mouth slightly open while coughing twice. 5. Spit any mucus out into a tissue. 6. Rest and repeat the steps once or twice as needed. SEEK MEDICAL CARE IF:  You are coughing up more mucus than usual.  There is a change in the color or thickness of your mucus.  Your breathing is more labored than usual.  Your breathing is faster than usual. SEEK IMMEDIATE MEDICAL CARE IF:  You have shortness of breath while you are resting.  You have shortness of breath that prevents you from:  Being able to talk.  Performing your usual physical activities. You have chest pain lasting longer than 5 minutes.  Your skin color is more cyanotic than usual.  You measure low oxygen saturations for longer than 5 minutes with a pulse oximeter. MAKE SURE YOU:  Understand these instructions.  Will watch your condition.  Will get help right away if you are not doing well or get worse. This information is not intended to replace advice given to you by your  health care provider. Make sure you discuss any questions you have with your health care provider.  Document Released: 04/13/2005 Document Revised: 07/25/2014 Document Reviewed: 02/28/2013  Elsevier Interactive Patient Education [employer].

## 2015-04-30 NOTE — ED Notes (Signed)
Ambulation HR maintain upper 80s, 02 ranged from 92-94%. PA notified. Upon returning to room with pt sitting on end of bed 02 ranged from 91-95%.

## 2015-04-30 NOTE — ED Notes (Signed)
PA at bedside.

## 2015-05-19 ENCOUNTER — Encounter: Payer: Self-pay | Admitting: Adult Health

## 2015-05-20 ENCOUNTER — Ambulatory Visit: Payer: Self-pay | Admitting: Pulmonary Disease

## 2015-09-23 ENCOUNTER — Ambulatory Visit: Payer: Self-pay | Admitting: Family Medicine

## 2015-09-29 ENCOUNTER — Ambulatory Visit: Payer: Self-pay | Attending: Family Medicine | Admitting: Internal Medicine

## 2015-09-29 ENCOUNTER — Other Ambulatory Visit: Payer: Self-pay

## 2015-09-29 ENCOUNTER — Encounter: Payer: Self-pay | Admitting: Internal Medicine

## 2015-09-29 VITALS — BP 120/83 | HR 95 | Temp 98.1°F | Resp 18 | Ht 73.0 in | Wt 252.0 lb

## 2015-09-29 DIAGNOSIS — R072 Precordial pain: Secondary | ICD-10-CM | POA: Insufficient documentation

## 2015-09-29 DIAGNOSIS — J45909 Unspecified asthma, uncomplicated: Secondary | ICD-10-CM | POA: Insufficient documentation

## 2015-09-29 DIAGNOSIS — R079 Chest pain, unspecified: Secondary | ICD-10-CM | POA: Insufficient documentation

## 2015-09-29 DIAGNOSIS — Z79899 Other long term (current) drug therapy: Secondary | ICD-10-CM | POA: Insufficient documentation

## 2015-09-29 DIAGNOSIS — K589 Irritable bowel syndrome without diarrhea: Secondary | ICD-10-CM | POA: Insufficient documentation

## 2015-09-29 DIAGNOSIS — Z888 Allergy status to other drugs, medicaments and biological substances status: Secondary | ICD-10-CM | POA: Insufficient documentation

## 2015-09-29 DIAGNOSIS — Z880 Allergy status to penicillin: Secondary | ICD-10-CM | POA: Insufficient documentation

## 2015-09-29 DIAGNOSIS — Z8249 Family history of ischemic heart disease and other diseases of the circulatory system: Secondary | ICD-10-CM | POA: Insufficient documentation

## 2015-09-29 DIAGNOSIS — F17210 Nicotine dependence, cigarettes, uncomplicated: Secondary | ICD-10-CM | POA: Insufficient documentation

## 2015-09-29 DIAGNOSIS — Z131 Encounter for screening for diabetes mellitus: Secondary | ICD-10-CM | POA: Insufficient documentation

## 2015-09-29 DIAGNOSIS — G609 Hereditary and idiopathic neuropathy, unspecified: Secondary | ICD-10-CM

## 2015-09-29 DIAGNOSIS — F3170 Bipolar disorder, currently in remission, most recent episode unspecified: Secondary | ICD-10-CM

## 2015-09-29 DIAGNOSIS — Z0000 Encounter for general adult medical examination without abnormal findings: Secondary | ICD-10-CM

## 2015-09-29 DIAGNOSIS — G4733 Obstructive sleep apnea (adult) (pediatric): Secondary | ICD-10-CM | POA: Insufficient documentation

## 2015-09-29 DIAGNOSIS — F209 Schizophrenia, unspecified: Secondary | ICD-10-CM | POA: Insufficient documentation

## 2015-09-29 DIAGNOSIS — J449 Chronic obstructive pulmonary disease, unspecified: Secondary | ICD-10-CM | POA: Insufficient documentation

## 2015-09-29 DIAGNOSIS — M549 Dorsalgia, unspecified: Secondary | ICD-10-CM | POA: Insufficient documentation

## 2015-09-29 DIAGNOSIS — Z7982 Long term (current) use of aspirin: Secondary | ICD-10-CM | POA: Insufficient documentation

## 2015-09-29 DIAGNOSIS — I10 Essential (primary) hypertension: Secondary | ICD-10-CM | POA: Insufficient documentation

## 2015-09-29 DIAGNOSIS — G629 Polyneuropathy, unspecified: Secondary | ICD-10-CM | POA: Insufficient documentation

## 2015-09-29 DIAGNOSIS — Z720 Tobacco use: Secondary | ICD-10-CM

## 2015-09-29 LAB — CBC WITH DIFFERENTIAL/PLATELET
BASOS ABS: 0.1 10*3/uL (ref 0.0–0.1)
Basophils Relative: 1 % (ref 0–1)
Eosinophils Absolute: 0.3 10*3/uL (ref 0.0–0.7)
Eosinophils Relative: 5 % (ref 0–5)
HEMATOCRIT: 46.5 % (ref 39.0–52.0)
HEMOGLOBIN: 15.8 g/dL (ref 13.0–17.0)
LYMPHS ABS: 2.2 10*3/uL (ref 0.7–4.0)
LYMPHS PCT: 34 % (ref 12–46)
MCH: 29.3 pg (ref 26.0–34.0)
MCHC: 34 g/dL (ref 30.0–36.0)
MCV: 86.3 fL (ref 78.0–100.0)
MONOS PCT: 7 % (ref 3–12)
MPV: 9.4 fL (ref 8.6–12.4)
Monocytes Absolute: 0.4 10*3/uL (ref 0.1–1.0)
NEUTROS PCT: 53 % (ref 43–77)
Neutro Abs: 3.4 10*3/uL (ref 1.7–7.7)
Platelets: 233 10*3/uL (ref 150–400)
RBC: 5.39 MIL/uL (ref 4.22–5.81)
RDW: 13 % (ref 11.5–15.5)
WBC: 6.4 10*3/uL (ref 4.0–10.5)

## 2015-09-29 LAB — BASIC METABOLIC PANEL
BUN: 10 mg/dL (ref 7–25)
CO2: 25 mmol/L (ref 20–31)
Calcium: 9.2 mg/dL (ref 8.6–10.3)
Chloride: 104 mmol/L (ref 98–110)
Creat: 0.94 mg/dL (ref 0.60–1.35)
GLUCOSE: 89 mg/dL (ref 65–99)
POTASSIUM: 4 mmol/L (ref 3.5–5.3)
Sodium: 139 mmol/L (ref 135–146)

## 2015-09-29 LAB — POCT GLYCOSYLATED HEMOGLOBIN (HGB A1C): Hemoglobin A1C: 5.4

## 2015-09-29 LAB — LIPID PANEL
CHOL/HDL RATIO: 5.9 ratio — AB (ref <=5.0)
CHOLESTEROL: 213 mg/dL — AB (ref 125–200)
HDL: 36 mg/dL — AB (ref >=40)
LDL Cholesterol: 158 mg/dL — ABNORMAL HIGH (ref <130)
Triglycerides: 96 mg/dL (ref <150)
VLDL: 19 mg/dL (ref <30)

## 2015-09-29 MED ORDER — ALBUTEROL SULFATE HFA 108 (90 BASE) MCG/ACT IN AERS: 2.0000 | INHALATION_SPRAY | Freq: Four times a day (QID) | RESPIRATORY_TRACT | Status: DC | PRN

## 2015-09-29 MED ORDER — TIOTROPIUM BROMIDE MONOHYDRATE 18 MCG IN CAPS
18.0000 ug | ORAL_CAPSULE | Freq: Every day | RESPIRATORY_TRACT | Status: AC
Start: 2015-09-29 — End: ?

## 2015-09-29 MED ORDER — NICOTINE 14 MG/24HR TD PT24: 14.0000 mg | MEDICATED_PATCH | Freq: Every day | TRANSDERMAL | Status: AC

## 2015-09-29 MED ORDER — GABAPENTIN 100 MG PO CAPS: 100.0000 mg | ORAL_CAPSULE | Freq: Three times a day (TID) | ORAL | Status: AC

## 2015-09-29 MED FILL — GABAPENTIN 100 MG CAPSULE: 100 | 30 days supply | Qty: 90 | Fill #0

## 2015-09-29 NOTE — Patient Instructions (Signed)
- it was a pleasure meeting you. - follow up with your pulmonologist to get your facemask refitted/reevaluated - financial services assistance  Neuropathic Pain ? Neuropathic pain is pain caused by damage to the nerves that are responsible for certain sensations in your body (sensory nerves). The pain can be caused by damage to:   The sensory nerves that send signals to your spinal cord and brain (peripheral nervous system).  The sensory nerves in your brain or spinal cord (central nervous system). Neuropathic pain can make you more sensitive to pain. What would be a minor sensation for most people may feel very painful if you have neuropathic pain. This is usually a long-term condition that can be difficult to treat. The type of pain can differ from person to person. It may start suddenly (acute), or it may develop slowly and last for a long time (chronic). Neuropathic pain may come and go as damaged nerves heal or may stay at the same level for years. It often causes emotional distress, loss of sleep, and a lower quality of life. CAUSES  The most common cause of damage to a sensory nerve is diabetes. Many other diseases and conditions can also cause neuropathic pain. Causes of neuropathic pain can be classified as:  Toxic. Many drugs and chemicals can cause toxic damage. The most common cause of toxic neuropathic pain is damage from drug treatment for cancer (chemotherapy).  Metabolic. This type of pain can happen when a disease causes imbalances that damage nerves. Diabetes is the most common of these diseases. Vitamin B deficiency caused by long-term alcohol abuse is another common cause.  Traumatic. Any injury that cuts, crushes, or stretches a nerve can cause damage and pain. A common example is feeling pain after losing an arm or leg (phantom limb pain).  Compression-related. If a sensory nerve gets trapped or compressed for a long period of time, the blood supply to the nerve can be cut  off.  Vascular. Many blood vessel diseases can cause neuropathic pain by decreasing blood supply and oxygen to nerves.  Autoimmune. This type of pain results from diseases in which the body's defense system mistakenly attacks sensory nerves. Examples of autoimmune diseases that can cause neuropathic pain include lupus and multiple sclerosis.  Infectious. Many types of viral infections can damage sensory nerves and cause pain. Shingles infection is a common cause of this type of pain.  Inherited. Neuropathic pain can be a symptom of many diseases that are passed down through families (genetic). SIGNS AND SYMPTOMS  The main symptom is pain. Neuropathic pain is often described as:  Burning.  Shock-like.  Stinging.  Hot or cold.  Itching. DIAGNOSIS  No single test can diagnose neuropathic pain. Your health care provider will do a physical exam and ask you about your pain. You may use a pain scale to describe how bad your pain is. You may also have tests to see if you have a high sensitivity to pain and to help find the cause and location of any sensory nerve damage. These tests may include:  Imaging studies, such as:  X-rays.  CT scan.  MRI.  Nerve conduction studies to test how well nerve signals travel through your sensory nerves (electrodiagnostic testing).  Stimulating your sensory nerves through electrodes on your skin and measuring the response in your spinal cord and brain (somatosensory evoked potentials). TREATMENT  Treatment for neuropathic pain may change over time. You may need to try different treatment options or a combination of  treatments. Some options include:  Over-the-counter pain relievers.  Prescription medicines. Some medicines used to treat other conditions may also help neuropathic pain. These include medicines to:  Control seizures (anticonvulsants).  Relieve depression (antidepressants).  Prescription-strength pain relievers (narcotics). These are  usually used when other pain relievers do not help.  Transcutaneous nerve stimulation (TENS). This uses electrical currents to block painful nerve signals. The treatment is painless.  Topical and local anesthetics. These are medicines that numb the nerves. They can be injected as a nerve block or applied to the skin.  Alternative treatments, such as:  Acupuncture.  Meditation.  Massage.  Physical therapy.  Pain management programs.  Counseling. HOME CARE INSTRUCTIONS  Learn as much as you can about your condition.  Take medicines only as directed by your health care provider.  Work closely with all your health care providers to find what works best for you.  Have a good support system at home.  Consider joining a chronic pain support group. SEEK MEDICAL CARE IF:  Your pain treatments are not helping.  You are having side effects from your medicines.  You are struggling with fatigue, mood changes, depression, or anxiety.   This information is not intended to replace advice given to you by your health care provider. Make sure you discuss any questions you have with your health care provider.   Document Released: 03/31/2004 Document Revised: 07/25/2014 Document Reviewed: 12/12/2013 Elsevier Interactive Patient Education [employer].

## 2015-09-29 NOTE — Progress Notes (Signed)
Nathaniel [name], is a 36 y.o. male  [id number]  [id number]  DOB - [birth year]  CC:  Chief Complaint  Patient presents with  . Establish Care  . Follow-up  . Chest Pain       HPI: Nathaniel [name] is a 35 y.o. male here today to establish medical care.  PMhx of mod osa, has home cpap which he attest to using nightly until the mask falls off.  + bipolar/schizophrenia which is followed by [facility], currently on Tegretol and Rexulta (doses unknown).  Co of intermittant, midsternal cp, pressure like, pain 4/10, mild now, for last year or so.  Said back in 2015 when he had chest pain, they gave him NTG and it go better.  Of note, .5 ppd tobacco still, but amnedable to try stoping w/ nicoderm patch at this time.  Also co of bilat. Back pain currently and new tingling/numbness bilat legs x 1 year, off /on, but appears to be getting worse, with difficulty going to bed because his legs cant keep still. He is concerned of restless leg syndrome.  Patient has No headache,No abdominal pain - No Nausea,No Cough - SOB.  Allergies  Allergen Reactions  . Flagyl [Metronidazole]     anaphylaxis  . Lithium Anaphylaxis  . Penicillins Anaphylaxis  . Phenergan [Promethazine Hcl]     seizures  . Vancomycin     Anaphylaxis   . Latex     rash   Past Medical History  Diagnosis Date  . COPD (chronic obstructive pulmonary disease) ([place])   . Bell's palsy   . IBS (irritable bowel syndrome)   . Hypertension   . Bipolar 1 disorder ([place])   . Schizophrenia ([place])   . Asthma   . Heavy smoker   . Carotid artery disease ([place])    Home meds/ Reviewed. Taking tegretol- bipolar rexulta - for schizophrenia Omeprazole 40 otc Asa 325 spiriva abuterol mdi  Family History  Problem Relation Age of Onset  . Hypertension Mother   . CAD Father     MI in his late 26s  . Stroke Father   . Diabetes Paternal Grandfather   . Pancreatic cancer Maternal Grandmother   . Prostate cancer Father   . Clotting  disorder Maternal Grandfather   . Heart disease Maternal Grandfather    Social History   Social History  . Marital Status: Married    Spouse Name: N/A  . Number of Children: 2  . Years of Education: N/A   Occupational History  . Not on file.   Social History Main Topics  . Smoking status: Current Every Day Smoker -- 0.00 packs/day for 14 years    Types: Cigarettes  . Smokeless tobacco: Never Used  . Alcohol Use: No  . Drug Use: No  . Sexual Activity: Not on file   Other Topics Concern  . Not on file   Social History Narrative   ** Merged History Encounter **        Review of Systems: Constitutional: Negative for fever, chills, diaphoresis, activity change, appetite change and fatigue. HENT: Negative for ear pain, nosebleeds, congestion, facial swelling, rhinorrhea, neck pain, neck stiffness and ear discharge.  Eyes: Negative for pain, discharge, redness, itching and visual disturbance. Respiratory: Negative for cough, choking, chest tightness, shortness of breath, wheezing and stridor.  Cardiovascular: Negative for  palpitations and leg swelling. +precordial cp., nonradiating, intermittant. Gastrointestinal: Negative for abdominal distention. Genitourinary: Negative for dysuria, urgency, frequency, hematuria, flank pain, decreased urine volume, difficulty urinating and  dyspareunia.  Musculoskeletal: Negative for back pain, joint swelling, arthralgia and gait problem. Neurological: Negative for dizziness, ha/, tremors, seizures, syncope, facial asymmetry, speech difficulty, weakness, light-headedness, +bilat le numbness.    Hematological: Negative for adenopathy. Does not bruise/bleed easily. Psychiatric/Behavioral: Negative for a/visual hallucinations, confusion, dysphoric mood, decreased concentration and agitation.    Objective:   Filed Vitals:   09/29/15 1555 09/29/15 1612  BP: 120/83 120/83  Pulse: 95 95  Temp: 98.1 F (36.7 C) 98.1 F (36.7 C)  Resp: 18 18     Physical Exam: Constitutional: Patient appears well-developed and well-nourished. No distress. Obese.  Pleasant aaox3 HENT: Normocephalic, atraumatic, External right and left ear normal. Oropharynx is clear and moist.  Eyes: Conjunctivae and EOM are normal. PERRL, no scleral icterus. Neck: Normal ROM. Neck supple. No JVD. No tracheal deviation. No thyromegaly. CVS: RRR, S1/S2 +, no murmurs, no gallops, no carotid bruit.  nttp Pulmonary: Effort and breath sounds normal, no stridor, rhonchi, wheezes, rales.  Abdominal: obese.  Soft. BS +, no distension, tenderness, rebound or guarding.  Musculoskeletal: Normal range of motion. No edema.   + bilat cva ttp Lymphadenopathy: No lymphadenopathy noted, cervical, Neuro: Alert. Normal reflexes bilat le,, muscle tone coordination. No cranial nerve deficit grossly. Skin: Skin is warm and dry. No rash noted. Not diaphoretic. No erythema. No pallor. Psychiatric: Normal mood and affect. Behavior, judgment, thought content normal.  Lab Results  Component Value Date   WBC 8.7 04/29/2015   HGB 15.8 04/29/2015   HCT 44.4 04/29/2015   MCV 86.2 04/29/2015   PLT 269 04/29/2015   Lab Results  Component Value Date   CREATININE 0.94 04/29/2015   BUN 9 04/29/2015   NA 135 04/29/2015   K 3.7 04/29/2015   CL 98* 04/29/2015   CO2 25 04/29/2015    Lab Results  Component Value Date   HGBA1C 5.4 09/29/2015   Lipid Panel     Component Value Date/Time   CHOL 227* 10/19/2014 2119   TRIG 192* 10/19/2014 2119   HDL 31* 10/19/2014 2119   CHOLHDL 7.3 10/19/2014 2119   VLDL 38 10/19/2014 2119   LDLCALC 158* 10/19/2014 2119   ekg 3/14 at 1600; nsr 86bpm, incomplete rbbb, nstemi, personally reviewed by me, unchanged compared to ekg 04/29/15    7/15 stresstest negative 7/15 echo wnl, ef 65% Assessment and plan:   1. Screening for diabetes mellitus - POCT A1C 5.4  2. Tobacco abuse - recd to stop smoking, interested in trying smoking cessation now,  currently smoking 1/2 ppd, will try 14mg  patch. - nicotine (NICODERM CQ) 14 mg/24hr patch; Place 1 patch (14 mg total) onto the skin daily.  Dispense: 28 patch; Refill: 0  3. Chronic obstructive pulmonary disease, unspecified COPD type ([place]) Renewed, stable, lungs clear. - albuterol (PROVENTIL HFA;VENTOLIN HFA) 108 (90 Base) MCG/ACT inhaler; Inhale 2 puffs into the lungs every 6 (six) hours as needed for wheezing or shortness of breath.  Dispense: 1 Inhaler; Refill: 0 - tiotropium (SPIRIVA) 18 MCG inhalation capsule; Place 1 capsule (18 mcg total) into inhaler and inhale daily.  Dispense: 30 capsule; Refill: 6 - Vitamin D, 25-hydroxy  4. Precordial pain, ?somatization complaint. - reassurance, renewed inhalers, ppi? - EKG 12-Lead  Nsr, nstemi.  5. Bipolar affective disorder in remission (HCC)/schizophrenia - taking tegretol and rexulta currently, via [facility].  6. Morbid obesity, unspecified obesity type ([place]) - encouraged diet/exercize  7. Le neuropathy,  idiopathic peripheral neuropathy? Vs medication s/e - chk cbc/ro anemia. Trial neurontin. -  gabapentin (NEURONTIN) 100 MG capsule; Take 1 capsule (100 mg total) by mouth 3 (three) times daily.  Dispense: 90 capsule; Refill: 3  8. Health care maintenance - Lipid Panel - Obstetric Panel - Basic Metabolic Panel - TSH - Vitamin D, 25-hydroxy  9. cva tenderness ? chk ua.  Return in about 3 months (around 12/30/2015).  The patient was given clear instructions to go to ER or return to medical center if symptoms don't improve, worsen or new problems develop. The patient verbalized understanding. The patient was told to call to get lab results if they haven't heard anything in the next week.     Maren Reamer, MD, [place], [place]   09/29/2015, 4:49 PM

## 2015-09-29 NOTE — Progress Notes (Signed)
Patient's here for re-establish care and f/up chest pains.   Patient state he's having slight chest pain off and on, described as "a ton of bricks on my chest". Rated pain 4/10.  Patient c/o leg pain, described as pins and needles with numbness, rated at 7/10. Patient reports having this leg pain for at least 2 yrs.  Patient requesting refills spriva caps, albuterol.  Patient agreed to diabetes screening. Declines flu shot.

## 2015-09-30 ENCOUNTER — Telehealth: Payer: Self-pay

## 2015-09-30 ENCOUNTER — Other Ambulatory Visit: Payer: Self-pay | Admitting: Internal Medicine

## 2015-09-30 ENCOUNTER — Encounter: Payer: Self-pay | Admitting: Clinical

## 2015-09-30 DIAGNOSIS — E782 Mixed hyperlipidemia: Secondary | ICD-10-CM

## 2015-09-30 DIAGNOSIS — E559 Vitamin D deficiency, unspecified: Secondary | ICD-10-CM

## 2015-09-30 LAB — VITAMIN D 25 HYDROXY (VIT D DEFICIENCY, FRACTURES): Vit D, 25-Hydroxy: 17 ng/mL — ABNORMAL LOW (ref 30–100)

## 2015-09-30 LAB — TSH: TSH: 0.98 m[IU]/L (ref 0.40–4.50)

## 2015-09-30 MED ORDER — SIMVASTATIN 40 MG PO TABS: 40.0000 mg | ORAL_TABLET | Freq: Every day | ORAL | Status: AC

## 2015-09-30 MED ORDER — VITAMIN D (ERGOCALCIFEROL) 1.25 MG (50000 UNIT) PO CAPS: 50000.0000 [IU] | ORAL_CAPSULE | ORAL | Status: AC

## 2015-09-30 NOTE — Telephone Encounter (Signed)
-----   Message from Maren Reamer, MD sent at 09/30/2015 10:18 AM EDT ----- Please call patient and tell him his cbc and bmp labs normal (not anemia, kidneys good). He is severely vit d deficient and his cholesterol panel was bad however.  Recommend started cholesterol med to protect heart and vit d supplements.  I have escribed simvastatin and vit d for him.  Please tell him to pick up at his pharmacy.

## 2015-09-30 NOTE — Telephone Encounter (Signed)
Please call patient and tell him his vit d is very low as well. I have put in an order for vit d as well. Low vit D can cause body aches and bone pain as well.   CMA called patient, patient verified name and DOB. Patient was given lab results, verbalized he understood with no further questions.

## 2015-09-30 NOTE — Progress Notes (Signed)
Depression screen [facility] 2/9 09/29/2015 09/29/2015 03/25/2014 02/26/2013  Decreased Interest 0 0 0 0  Down, Depressed, Hopeless 1 1 0 0  PHQ - 2 Score 1 1 0 0  PHQ9: 3 (0,1,1,1,0,0,0,0,0)  GAD 7 : Generalized Anxiety Score 09/29/2015  Nervous, Anxious, on Edge 0  Control/stop worrying 0  Worry too much - different things 0  Trouble relaxing 0  Restless 0  Easily annoyed or irritable 0  Afraid - awful might happen 0  Total GAD 7 Score 0

## 2015-10-01 MED FILL — !VENTOLIN HFA INHALER: 108 (90 BAS | 30 days supply | Qty: 18 | Fill #0

## 2015-10-01 MED FILL — VIT D2 1.25 MG (50,000 UNIT: 1.25 MG | 84 days supply | Qty: 12 | Fill #0

## 2015-10-01 MED FILL — SIMVASTATIN 40 MG TABLET: 40 | 30 days supply | Qty: 30 | Fill #0

## 2015-10-09 ENCOUNTER — Encounter (HOSPITAL_COMMUNITY): Payer: Self-pay

## 2015-10-09 DIAGNOSIS — F319 Bipolar disorder, unspecified: Secondary | ICD-10-CM | POA: Insufficient documentation

## 2015-10-09 DIAGNOSIS — Z8669 Personal history of other diseases of the nervous system and sense organs: Secondary | ICD-10-CM | POA: Insufficient documentation

## 2015-10-09 DIAGNOSIS — Z7982 Long term (current) use of aspirin: Secondary | ICD-10-CM | POA: Insufficient documentation

## 2015-10-09 DIAGNOSIS — J441 Chronic obstructive pulmonary disease with (acute) exacerbation: Secondary | ICD-10-CM | POA: Insufficient documentation

## 2015-10-09 DIAGNOSIS — Z79899 Other long term (current) drug therapy: Secondary | ICD-10-CM | POA: Insufficient documentation

## 2015-10-09 DIAGNOSIS — J111 Influenza due to unidentified influenza virus with other respiratory manifestations: Secondary | ICD-10-CM | POA: Insufficient documentation

## 2015-10-09 DIAGNOSIS — Z91040 Latex allergy status: Secondary | ICD-10-CM | POA: Insufficient documentation

## 2015-10-09 DIAGNOSIS — I10 Essential (primary) hypertension: Secondary | ICD-10-CM | POA: Insufficient documentation

## 2015-10-09 DIAGNOSIS — R109 Unspecified abdominal pain: Secondary | ICD-10-CM | POA: Insufficient documentation

## 2015-10-09 DIAGNOSIS — Z8719 Personal history of other diseases of the digestive system: Secondary | ICD-10-CM | POA: Insufficient documentation

## 2015-10-09 DIAGNOSIS — F17210 Nicotine dependence, cigarettes, uncomplicated: Secondary | ICD-10-CM | POA: Insufficient documentation

## 2015-10-09 DIAGNOSIS — Z880 Allergy status to penicillin: Secondary | ICD-10-CM | POA: Insufficient documentation

## 2015-10-09 LAB — URINALYSIS, ROUTINE W REFLEX MICROSCOPIC
BILIRUBIN URINE: NEGATIVE
GLUCOSE, UA: NEGATIVE mg/dL
HGB URINE DIPSTICK: NEGATIVE
Ketones, ur: NEGATIVE mg/dL
Leukocytes, UA: NEGATIVE
Nitrite: NEGATIVE
PH: 6 (ref 5.0–8.0)
Protein, ur: NEGATIVE mg/dL
SPECIFIC GRAVITY, URINE: 1.005 (ref 1.005–1.030)

## 2015-10-09 LAB — CBC
HEMATOCRIT: 44.9 % (ref 39.0–52.0)
HEMOGLOBIN: 15.6 g/dL (ref 13.0–17.0)
MCH: 29.9 pg (ref 26.0–34.0)
MCHC: 34.7 g/dL (ref 30.0–36.0)
MCV: 86.2 fL (ref 78.0–100.0)
Platelets: 229 10*3/uL (ref 150–400)
RBC: 5.21 MIL/uL (ref 4.22–5.81)
RDW: 12.2 % (ref 11.5–15.5)
WBC: 7.7 10*3/uL (ref 4.0–10.5)

## 2015-10-09 NOTE — ED Notes (Signed)
Pt here with c/o body aches, cough, sore throat, n/v/d. He reports he is unable to tolerate PO. Symptom onset last night.

## 2015-10-10 ENCOUNTER — Emergency Department (HOSPITAL_COMMUNITY)
Admission: EM | Admit: 2015-10-10 | Discharge: 2015-10-10 | Disposition: A | Discharge status: Home / Self Care | Payer: Self-pay | Attending: Emergency Medicine | Admitting: Emergency Medicine

## 2015-10-10 ENCOUNTER — Emergency Department (HOSPITAL_COMMUNITY): Payer: Self-pay

## 2015-10-10 DIAGNOSIS — J111 Influenza due to unidentified influenza virus with other respiratory manifestations: Secondary | ICD-10-CM

## 2015-10-10 LAB — COMPREHENSIVE METABOLIC PANEL
ALBUMIN: 3.7 g/dL (ref 3.5–5.0)
ALK PHOS: 91 U/L (ref 38–126)
ALT: 47 U/L (ref 17–63)
ANION GAP: 10 (ref 5–15)
AST: 29 U/L (ref 15–41)
BILIRUBIN TOTAL: 0.7 mg/dL (ref 0.3–1.2)
BUN: 9 mg/dL (ref 6–20)
CALCIUM: 9 mg/dL (ref 8.9–10.3)
CO2: 23 mmol/L (ref 22–32)
Chloride: 105 mmol/L (ref 101–111)
Creatinine, Ser: 1.08 mg/dL (ref 0.61–1.24)
GLUCOSE: 127 mg/dL — AB (ref 65–99)
POTASSIUM: 3.4 mmol/L — AB (ref 3.5–5.1)
Sodium: 138 mmol/L (ref 135–145)
TOTAL PROTEIN: 6.2 g/dL — AB (ref 6.5–8.1)

## 2015-10-10 LAB — LIPASE, BLOOD: Lipase: 30 U/L (ref 11–51)

## 2015-10-10 MED ORDER — KETOROLAC TROMETHAMINE 30 MG/ML IJ SOLN
30.0000 mg | Freq: Once | INTRAMUSCULAR | Status: AC
  Administered 2015-10-10: 30 mg via INTRAVENOUS
  Filled 2015-10-10: qty 1

## 2015-10-10 MED ORDER — PREDNISONE 20 MG PO TABS: 60.0000 mg | ORAL_TABLET | Freq: Every day | ORAL | Status: DC

## 2015-10-10 MED ORDER — IPRATROPIUM BROMIDE 0.02 % IN SOLN
1.0000 mg | Freq: Once | RESPIRATORY_TRACT | Status: AC
  Administered 2015-10-10: 1 mg via RESPIRATORY_TRACT

## 2015-10-10 MED ORDER — MAGNESIUM SULFATE 2 GM/50ML IV SOLN
2.0000 g | Freq: Once | INTRAVENOUS | Status: AC
  Administered 2015-10-10: 2 g via INTRAVENOUS
  Filled 2015-10-10: qty 50

## 2015-10-10 MED ORDER — ALBUTEROL (5 MG/ML) CONTINUOUS INHALATION SOLN
10.0000 mg/h | INHALATION_SOLUTION | RESPIRATORY_TRACT | Status: DC
  Administered 2015-10-10: 10 mg/h via RESPIRATORY_TRACT

## 2015-10-10 MED ORDER — PREDNISONE 20 MG PO TABS
60.0000 mg | ORAL_TABLET | Freq: Once | ORAL | Status: AC
  Administered 2015-10-10: 60 mg via ORAL
  Filled 2015-10-10: qty 3

## 2015-10-10 NOTE — Discharge Instructions (Signed)
Influenza, Adult Nathaniel [name], see your primary care doctor within 3 days for close follow up.  Continue to take steroids to complete your treatment.  Use ibuprofen as needed for fever and body aches.  If any symptoms worsen, come back to the ED immediately. Thank you. Influenza (flu) is an infection in the mouth, nose, and throat (respiratory tract) caused by a virus. The flu can make you feel very ill. Influenza spreads easily from person to person (contagious).  HOME CARE   Only take medicines as told by your doctor.  Use a cool mist humidifier to make breathing easier.  Get plenty of rest until your fever goes away. This usually takes 3 to 4 days.  Drink enough fluids to keep your pee (urine) clear or pale yellow.  Cover your mouth and nose when you cough or sneeze.  Wash your hands well to avoid spreading the flu.  Stay home from work or school until your fever has been gone for at least 1 full day.  Get a flu shot every year. GET HELP RIGHT AWAY IF:   You have trouble breathing or feel short of breath.  Your skin or nails turn blue.  You have severe neck pain or stiffness.  You have a severe headache, facial pain, or earache.  Your fever gets worse or keeps coming back.  You feel sick to your stomach (nauseous), throw up (vomit), or have watery poop (diarrhea).  You have chest pain.  You have a deep cough that gets worse, or you cough up more thick spit (mucus). MAKE SURE YOU:   Understand these instructions.  Will watch your condition.  Will get help right away if you are not doing well or get worse.   This information is not intended to replace advice given to you by your health care provider. Make sure you discuss any questions you have with your health care provider.   Document Released: 04/12/2008 Document Revised: 07/25/2014 Document Reviewed: 10/03/2011 Elsevier Interactive Patient Education [employer].

## 2015-10-10 NOTE — ED Notes (Signed)
Patient transported to X-ray 

## 2015-10-10 NOTE — ED Provider Notes (Signed)
CSN: [id number]     Arrival date & time 10/09/15  2259 History  By signing my name below, I, [name], attest that this documentation has been prepared under the direction and in the presence of Everlene Balls, MD. Electronically Signed: Judithann Sauger, ED Scribe. 10/10/2015. 2:08 AM.    Chief Complaint  Patient presents with  . Generalized Body Aches   The history is provided by the patient. No language interpreter was used.   HPI Comments: Nathaniel [name] is a 35 y.o. male with a hx of COPD who presents to the Emergency Department complaining of gradually worsening generalized body aches onset yesterday. He reports associated persistent productive cough, abdominal pain, and n/v. He states that he has tried Ibuprofen and Mucinex with no relief. Pt is a current smoker. He denies any known sick contacts. He also denies any fever, chills, or diarrhea.    Past Medical History  Diagnosis Date  . COPD (chronic obstructive pulmonary disease) ([place])   . Bell's palsy   . IBS (irritable bowel syndrome)   . Hypertension   . Bipolar 1 disorder ([place])   . Schizophrenia ([place])   . Asthma   . Heavy smoker   . Carotid artery disease [facility])    Past Surgical History  Procedure Laterality Date  . Cholecystectomy    . Abdominal surgery    . Incision and drainage Left 02/05/2013    Dr Rolena Infante  . Knee arthroscopy with medial menisectomy Left 02/05/2013    Procedure: KNEE ARTHROSCOPY  ;  Surgeon: Melina Schools, MD;  Location: [place];  Service: Orthopedics;  Laterality: Left;  . Irrigation and debridement knee  02/05/2013    Procedure: IRRIGATION AND DEBRIDEMENT KNEE patella bursa;  Surgeon: Melina Schools, MD;  Location: [facility] OR;  Service: Orthopedics;;   Family History  Problem Relation Age of Onset  . Hypertension Mother   . CAD Father     MI in his late 24s  . Stroke Father   . Diabetes Paternal Grandfather   . Pancreatic cancer Maternal Grandmother   . Prostate cancer Father   . Clotting  disorder Maternal Grandfather   . Heart disease Maternal Grandfather    Social History  Substance Use Topics  . Smoking status: Current Every Day Smoker -- 0.00 packs/day for 14 years    Types: Cigarettes  . Smokeless tobacco: Never Used  . Alcohol Use: No    Review of Systems  Constitutional: Negative for fever and chills.  Respiratory: Positive for cough.   Gastrointestinal: Positive for nausea, vomiting and abdominal pain. Negative for diarrhea.  Musculoskeletal: Positive for myalgias.  All other systems reviewed and are negative.     Allergies  Flagyl; Lithium; Penicillins; Phenergan; Vancomycin; and Latex  Home Medications   Prior to Admission medications   Medication Sig Start Date End Date Taking? Authorizing Provider  albuterol (PROVENTIL HFA;VENTOLIN HFA) 108 (90 Base) MCG/ACT inhaler Inhale 2 puffs into the lungs every 6 (six) hours as needed for wheezing or shortness of breath. 09/29/15   Maren Reamer, MD  aspirin EC 325 MG tablet Take 325 mg by mouth daily.    Historical Provider, MD  gabapentin (NEURONTIN) 100 MG capsule Take 1 capsule (100 mg total) by mouth 3 (three) times daily. 09/29/15   Maren Reamer, MD  nicotine (NICODERM CQ) 14 mg/24hr patch Place 1 patch (14 mg total) onto the skin daily. 09/29/15   Maren Reamer, MD  omeprazole (PRILOSEC) 40 MG capsule Take 1 capsule (  40 mg total) by mouth daily. 06/04/14   Irene Shipper, MD  simvastatin (ZOCOR) 40 MG tablet Take 1 tablet (40 mg total) by mouth at bedtime. 09/30/15   Maren Reamer, MD  tiotropium (SPIRIVA) 18 MCG inhalation capsule Place 1 capsule (18 mcg total) into inhaler and inhale daily. 09/29/15   Maren Reamer, MD  Vitamin D, Ergocalciferol, (DRISDOL) 50000 units CAPS capsule Take 1 capsule (50,000 Units total) by mouth every 7 (seven) days. 09/30/15   Maren Reamer, MD   BP 137/93 mmHg  Pulse 103  Temp(Src) 98.6 F (37 C) (Oral)  Resp 20  SpO2 96% Physical Exam  Constitutional:  He is oriented to person, place, and time. Vital signs are normal. He appears well-developed and well-nourished.  Non-toxic appearance. He does not appear ill. No distress.  Tactile fever  HENT:  Head: Normocephalic and atraumatic.  Nose: Nose normal.  Mouth/Throat: Oropharynx is clear and moist. No oropharyngeal exudate.  Eyes: Conjunctivae and EOM are normal. Pupils are equal, round, and reactive to light. No scleral icterus.  Neck: Normal range of motion. Neck supple. No tracheal deviation, no edema, no erythema and normal range of motion present. No thyroid mass and no thyromegaly present.  Cardiovascular: Normal rate, regular rhythm, S1 normal, S2 normal, normal heart sounds, intact distal pulses and normal pulses.  Exam reveals no gallop and no friction rub.   No murmur heard. Pulmonary/Chest: Effort normal. No respiratory distress. He has wheezes. He has no rhonchi. He has no rales.  Tachypnea Increased work of breathing Wheezing  Abdominal: Soft. Normal appearance and bowel sounds are normal. He exhibits no distension, no ascites and no mass. There is no hepatosplenomegaly. There is no tenderness. There is no rebound, no guarding and no CVA tenderness.  Musculoskeletal: Normal range of motion. He exhibits no edema or tenderness.  Lymphadenopathy:    He has no cervical adenopathy.  Neurological: He is alert and oriented to person, place, and time. He has normal strength. No cranial nerve deficit or sensory deficit.  Skin: Skin is warm, dry and intact. No petechiae and no rash noted. He is not diaphoretic. No erythema. No pallor.  Psychiatric: He has a normal mood and affect. His behavior is normal. Judgment normal.  Nursing note and vitals reviewed.   ED Course  Procedures (including critical care time) DIAGNOSTIC STUDIES: Oxygen Saturation is 96% on RA, normal by my interpretation.    COORDINATION OF CARE: 2:05 AM- Pt advised of plan for treatment and pt agrees. Pt will receive  chest x-ray for further evaluation. He will receive Albuterol, Atrovent, prednisone, Toradol IM, and Magnesium Sulfate IVPB.    Labs Review Labs Reviewed  COMPREHENSIVE METABOLIC PANEL - Abnormal; Notable for the following:    Potassium 3.4 (*)    Glucose, Bld 127 (*)    Total Protein 6.2 (*)    All other components within normal limits  LIPASE, BLOOD  CBC  URINALYSIS, ROUTINE W REFLEX MICROSCOPIC (NOT AT [facility])    Imaging Review Dg Chest 2 View  10/10/2015  CLINICAL DATA:  35 year old male with with fever cough and shortness of breath EXAM: CHEST  2 VIEW COMPARISON:  Chest radiograph dated 06/19/2015 FINDINGS: Two views of the chest demonstrate mild diffuse increased interstitial prominence which may be related to shallow inspiration and atelectasis. There is no focal consolidation, pleural effusion, or pneumothorax. The cardiac silhouette is within normal limits. No acute osseous pathology. IMPRESSION: No focal consolidation. Electronically Signed   By:  Anner Crete M.D.   On: 10/10/2015 04:31     Everlene Balls, MD has personally reviewed and evaluated these images and lab results as part of his medical decision-making.  MDM   Final diagnoses:  None     patient presents to the emergency department for flulike symptoms. He's had body aches, sore throat, nausea and vomiting. h denies sick contacts. Chest x-rays negtive for pneumonia. He was wheezing on exam, he was given albuterol, ipratropium, prednisone, magnesium or treatment.  patient also given Toradol for his body aches.   5:20 AM  Upon repeat evaluation, patient states he feels much better.  Wheezing hasimproved.We'll discharge home with 4 more days of prdnisone. Tylenol and ibprofen recommended for home care. Primary care follow-up advised in 3 days. He appear well in no acute distress, vital signs remain within his normal limits and he is safe for discharge.  I personally performed the services described in this  documentation, which was scribed in my presence. The recorded information has been reviewed and is accurate.     Everlene Balls, MD 10/10/15 [phone]

## 2015-10-10 NOTE — ED Notes (Signed)
Dr. Oni at bedside. 

## 2015-10-15 ENCOUNTER — Other Ambulatory Visit: Payer: Self-pay | Admitting: *Deleted

## 2015-10-15 DIAGNOSIS — J449 Chronic obstructive pulmonary disease, unspecified: Secondary | ICD-10-CM

## 2015-10-15 MED ORDER — ALBUTEROL SULFATE HFA 108 (90 BASE) MCG/ACT IN AERS: 2.0000 | INHALATION_SPRAY | Freq: Four times a day (QID) | RESPIRATORY_TRACT | Status: DC | PRN

## 2015-10-15 MED FILL — !VENTOLIN HFA INHALER: 108 (90 BAS | 18 days supply | Qty: 18 | Fill #0

## 2015-10-15 MED FILL — SPIRIVA 18 MCG CP-HANDIHALE: 18 | 30 days supply | Qty: 30 | Fill #0

## 2015-11-11 MED FILL — SIMVASTATIN 40 MG TABLET: 40 | 30 days supply | Qty: 30 | Fill #1

## 2015-11-11 MED FILL — GABAPENTIN 100 MG CAPSULE: 100 | 30 days supply | Qty: 90 | Fill #1

## 2015-11-13 MED FILL — !VENTOLIN HFA INHALER: 108 (90 BAS | 18 days supply | Qty: 18 | Fill #1

## 2015-11-17 ENCOUNTER — Other Ambulatory Visit: Payer: Self-pay | Admitting: Internal Medicine

## 2015-11-17 MED ORDER — ALBUTEROL SULFATE HFA 108 (90 BASE) MCG/ACT IN AERS: 2.0000 | INHALATION_SPRAY | Freq: Four times a day (QID) | RESPIRATORY_TRACT | Status: DC | PRN

## 2015-12-01 ENCOUNTER — Emergency Department (HOSPITAL_COMMUNITY)
Admission: EM | Admit: 2015-12-01 | Discharge: 2015-12-01 | Discharge status: Against Medical Advice | Payer: Self-pay | Attending: Emergency Medicine | Admitting: Emergency Medicine

## 2015-12-01 ENCOUNTER — Encounter (HOSPITAL_COMMUNITY): Payer: Self-pay | Admitting: Emergency Medicine

## 2015-12-01 ENCOUNTER — Emergency Department (HOSPITAL_COMMUNITY): Payer: Self-pay

## 2015-12-01 DIAGNOSIS — F209 Schizophrenia, unspecified: Secondary | ICD-10-CM | POA: Insufficient documentation

## 2015-12-01 DIAGNOSIS — Z91040 Latex allergy status: Secondary | ICD-10-CM | POA: Insufficient documentation

## 2015-12-01 DIAGNOSIS — Z7951 Long term (current) use of inhaled steroids: Secondary | ICD-10-CM | POA: Insufficient documentation

## 2015-12-01 DIAGNOSIS — J449 Chronic obstructive pulmonary disease, unspecified: Secondary | ICD-10-CM | POA: Insufficient documentation

## 2015-12-01 DIAGNOSIS — R1030 Lower abdominal pain, unspecified: Secondary | ICD-10-CM | POA: Insufficient documentation

## 2015-12-01 DIAGNOSIS — R112 Nausea with vomiting, unspecified: Secondary | ICD-10-CM | POA: Insufficient documentation

## 2015-12-01 DIAGNOSIS — I10 Essential (primary) hypertension: Secondary | ICD-10-CM | POA: Insufficient documentation

## 2015-12-01 DIAGNOSIS — F319 Bipolar disorder, unspecified: Secondary | ICD-10-CM | POA: Insufficient documentation

## 2015-12-01 DIAGNOSIS — F17210 Nicotine dependence, cigarettes, uncomplicated: Secondary | ICD-10-CM | POA: Insufficient documentation

## 2015-12-01 DIAGNOSIS — Z7952 Long term (current) use of systemic steroids: Secondary | ICD-10-CM | POA: Insufficient documentation

## 2015-12-01 DIAGNOSIS — Z79899 Other long term (current) drug therapy: Secondary | ICD-10-CM | POA: Insufficient documentation

## 2015-12-01 LAB — URINALYSIS, ROUTINE W REFLEX MICROSCOPIC
Bilirubin Urine: NEGATIVE
Glucose, UA: NEGATIVE mg/dL
Hgb urine dipstick: NEGATIVE
KETONES UR: NEGATIVE mg/dL
LEUKOCYTES UA: NEGATIVE
NITRITE: NEGATIVE
PH: 7 (ref 5.0–8.0)
Protein, ur: NEGATIVE mg/dL
Specific Gravity, Urine: 1.012 (ref 1.005–1.030)

## 2015-12-01 LAB — CBC
HEMATOCRIT: 41 % (ref 39.0–52.0)
Hemoglobin: 14.9 g/dL (ref 13.0–17.0)
MCH: 30.3 pg (ref 26.0–34.0)
MCHC: 36.3 g/dL — ABNORMAL HIGH (ref 30.0–36.0)
MCV: 83.3 fL (ref 78.0–100.0)
PLATELETS: 219 10*3/uL (ref 150–400)
RBC: 4.92 MIL/uL (ref 4.22–5.81)
RDW: 12.5 % (ref 11.5–15.5)
WBC: 7.2 10*3/uL (ref 4.0–10.5)

## 2015-12-01 LAB — COMPREHENSIVE METABOLIC PANEL
ALT: 33 U/L (ref 17–63)
AST: 21 U/L (ref 15–41)
Albumin: 3.9 g/dL (ref 3.5–5.0)
Alkaline Phosphatase: 82 U/L (ref 38–126)
Anion gap: 8 (ref 5–15)
BILIRUBIN TOTAL: 0.6 mg/dL (ref 0.3–1.2)
BUN: 7 mg/dL (ref 6–20)
CO2: 25 mmol/L (ref 22–32)
Calcium: 9 mg/dL (ref 8.9–10.3)
Chloride: 105 mmol/L (ref 101–111)
Creatinine, Ser: 1.07 mg/dL (ref 0.61–1.24)
Glucose, Bld: 101 mg/dL — ABNORMAL HIGH (ref 65–99)
POTASSIUM: 3.3 mmol/L — AB (ref 3.5–5.1)
Sodium: 138 mmol/L (ref 135–145)
TOTAL PROTEIN: 6.4 g/dL — AB (ref 6.5–8.1)

## 2015-12-01 LAB — LIPASE, BLOOD: Lipase: 18 U/L (ref 11–51)

## 2015-12-01 MED ORDER — SODIUM CHLORIDE 0.9 % IV BOLUS (SEPSIS)
1000.0000 mL | Freq: Once | INTRAVENOUS | Status: AC
  Administered 2015-12-01: 1000 mL via INTRAVENOUS

## 2015-12-01 MED ORDER — PANTOPRAZOLE SODIUM 40 MG IV SOLR
40.0000 mg | Freq: Once | INTRAVENOUS | Status: AC
  Administered 2015-12-01: 40 mg via INTRAVENOUS
  Filled 2015-12-01: qty 40

## 2015-12-01 MED ORDER — IOPAMIDOL (ISOVUE-300) INJECTION 61%
100.0000 mL | Freq: Once | INTRAVENOUS | Status: AC | PRN
Start: 2015-12-01 — End: 2015-12-01
  Administered 2015-12-01: 100 mL via INTRAVENOUS

## 2015-12-01 MED ORDER — METOCLOPRAMIDE HCL 5 MG/ML IJ SOLN
10.0000 mg | Freq: Once | INTRAMUSCULAR | Status: AC
  Administered 2015-12-01: 10 mg via INTRAVENOUS
  Filled 2015-12-01: qty 2

## 2015-12-01 MED ORDER — ACETAMINOPHEN 325 MG PO TABS
650.0000 mg | ORAL_TABLET | Freq: Once | ORAL | Status: AC
  Administered 2015-12-01: 650 mg via ORAL
  Filled 2015-12-01: qty 2

## 2015-12-01 MED ORDER — ONDANSETRON HCL 4 MG/2ML IJ SOLN
4.0000 mg | Freq: Once | INTRAMUSCULAR | Status: AC
  Administered 2015-12-01: 4 mg via INTRAVENOUS
  Filled 2015-12-01: qty 2

## 2015-12-01 NOTE — ED Notes (Signed)
PA at bedside.

## 2015-12-01 NOTE — ED Notes (Signed)
Pt left AMA. No explanation given. Pt asked for his IV to be removed and stated that he was leaving.

## 2015-12-01 NOTE — Progress Notes (Signed)
Patient listed as not having a pcp or insurance.  Per chart review patient has been seen at the [facility] 03/14 to establish care, seen by Dr. Clide Dales.  EDCM spoke to patient at bedside.  Patient reports he is in the process of getting approved for Medicaid.  Patient confirms he has been seen at the [facility].  He reports they wanted him to be seen again in June.  No future appointments noted on patient's chart.  [facility] encouraged patient to call [place] to see if he has an appointment scheduled and to also ask for an appointment with the financial counselor regarding the orange card.  Patient verbalized understanding.  No further EDCM needs at this time.

## 2015-12-01 NOTE — ED Provider Notes (Signed)
CSN: [id number]     Arrival date & time 12/01/15  1939 History   First MD Initiated Contact with Patient 12/01/15 2047     Chief Complaint  Patient presents with  . Emesis     (Consider location/radiation/quality/duration/timing/severity/associated sxs/prior Treatment) HPI Comments: Patient presents with complaints of bilateral lower abdominal pain and vomiting ongoing for the past 3 months. He has not had any fevers or diarrhea. No blood in the stool. No urinary symptoms. No upper abdominal pain. History of cholecystectomy and hernia repair. Patient has been taking over-the-counter medications without relief. He denies heavy NSAID or alcohol use. Patient denies suspicious food or water exposures. States he has had a colonoscopy and had "precancerous" lesions removed. He was supposed to follow-up in a year but has not followed up. He states that he has had a previous EGD but does not report ever having peptic ulcer disease. Onset of symptoms acute. Course is recurrent. Nothing makes symptoms better or worse.  Previous GI visit with Dr. Henrene Pastor from 05/2014 reviewed. Patient was started on PPI at that time. Patient was to follow-up in 6 weeks but apparently did not. Patient had similar symptoms at that time with abdominal pain symptoms and vomiting. This appears to be an ongoing chronic problem for the patient.   Patient is a 35 y.o. male presenting with vomiting. The history is provided by the patient and medical records.  Emesis Associated symptoms: abdominal pain   Associated symptoms: no diarrhea, no headaches, no myalgias and no sore throat     Past Medical History  Diagnosis Date  . COPD (chronic obstructive pulmonary disease) ([place])   . Bell's palsy   . IBS (irritable bowel syndrome)   . Hypertension   . Bipolar 1 disorder ([place])   . Schizophrenia ([place])   . Asthma   . Heavy smoker   . Carotid artery disease [facility])    Past Surgical History  Procedure Laterality Date  .  Cholecystectomy    . Abdominal surgery    . Incision and drainage Left 02/05/2013    Dr Rolena Infante  . Knee arthroscopy with medial menisectomy Left 02/05/2013    Procedure: KNEE ARTHROSCOPY  ;  Surgeon: Melina Schools, MD;  Location: [facility];  Service: Orthopedics;  Laterality: Left;  . Irrigation and debridement knee  02/05/2013    Procedure: IRRIGATION AND DEBRIDEMENT KNEE patella bursa;  Surgeon: Melina Schools, MD;  Location: [facility] OR;  Service: Orthopedics;;   Family History  Problem Relation Age of Onset  . Hypertension Mother   . CAD Father     MI in his late 22s  . Stroke Father   . Diabetes Paternal Grandfather   . Pancreatic cancer Maternal Grandmother   . Prostate cancer Father   . Clotting disorder Maternal Grandfather   . Heart disease Maternal Grandfather    Social History  Substance Use Topics  . Smoking status: Current Every Day Smoker -- 0.00 packs/day for 14 years    Types: Cigarettes  . Smokeless tobacco: Never Used  . Alcohol Use: No    Review of Systems  Constitutional: Negative for fever.  HENT: Negative for rhinorrhea and sore throat.   Eyes: Negative for redness.  Respiratory: Negative for cough.   Cardiovascular: Negative for chest pain.  Gastrointestinal: Positive for nausea, vomiting and abdominal pain. Negative for diarrhea, constipation and blood in stool.  Genitourinary: Negative for dysuria.  Musculoskeletal: Negative for myalgias.  Skin: Negative for rash.  Neurological: Negative for headaches.  Allergies  Flagyl; Lithium; Penicillins; Phenergan; Vancomycin; and Latex  Home Medications   Prior to Admission medications   Medication Sig Start Date End Date Taking? Authorizing Provider  albuterol (PROVENTIL HFA;VENTOLIN HFA) 108 (90 Base) MCG/ACT inhaler Inhale 2 puffs into the lungs every 6 (six) hours as needed for wheezing or shortness of breath. 11/17/15  Yes Dawn Lazarus Gowda, MD  Brexpiprazole (REXULTI) 0.5 MG TABS Take 1 tablet by mouth 2  (two) times daily.   Yes Historical Provider, MD  carbamazepine (TEGRETOL) 200 MG tablet Take 200 mg by mouth 2 (two) times daily.   Yes Historical Provider, MD  gabapentin (NEURONTIN) 100 MG capsule Take 1 capsule (100 mg total) by mouth 3 (three) times daily. 09/29/15  Yes Maren Reamer, MD  omeprazole (PRILOSEC) 40 MG capsule Take 1 capsule (40 mg total) by mouth daily. 06/04/14  Yes Irene Shipper, MD  simvastatin (ZOCOR) 40 MG tablet Take 1 tablet (40 mg total) by mouth at bedtime. 09/30/15  Yes Dawn Lazarus Gowda, MD  albuterol (PROVENTIL HFA;VENTOLIN HFA) 108 (90 Base) MCG/ACT inhaler Inhale 2 puffs into the lungs every 6 (six) hours as needed for wheezing or shortness of breath. Patient not taking: Reported on 12/01/2015 10/15/15   Maren Reamer, MD  nicotine (NICODERM CQ) 14 mg/24hr patch Place 1 patch (14 mg total) onto the skin daily. 09/29/15   Maren Reamer, MD  predniSONE (DELTASONE) 20 MG tablet Take 3 tablets (60 mg total) by mouth daily. Patient not taking: Reported on 12/01/2015 10/10/15   Everlene Balls, MD  tiotropium (SPIRIVA) 18 MCG inhalation capsule Place 1 capsule (18 mcg total) into inhaler and inhale daily. 09/29/15   Maren Reamer, MD  Vitamin D, Ergocalciferol, (DRISDOL) 50000 units CAPS capsule Take 1 capsule (50,000 Units total) by mouth every 7 (seven) days. 09/30/15   Maren Reamer, MD   BP 124/85 mmHg  Pulse 87  Temp(Src) 99.2 F (37.3 C) (Oral)  Resp 16  SpO2 96% Physical Exam  Constitutional: He appears well-developed and well-nourished.  HENT:  Head: Normocephalic and atraumatic.  Mouth/Throat: Oropharynx is clear and moist.  Eyes: Conjunctivae are normal. Right eye exhibits no discharge. Left eye exhibits no discharge.  Neck: Normal range of motion. Neck supple.  Cardiovascular: Normal rate, regular rhythm and normal heart sounds.   No murmur heard. Pulmonary/Chest: Effort normal and breath sounds normal. No respiratory distress. He has no wheezes. He  has no rales.  Abdominal: Soft. Bowel sounds are normal. He exhibits no distension. There is no tenderness. There is no rebound.  Neurological: He is alert.  Skin: Skin is warm and dry.  Psychiatric: He has a normal mood and affect.  Nursing note and vitals reviewed.   ED Course  Procedures (including critical care time) Labs Review Labs Reviewed  COMPREHENSIVE METABOLIC PANEL - Abnormal; Notable for the following:    Potassium 3.3 (*)    Glucose, Bld 101 (*)    Total Protein 6.4 (*)    All other components within normal limits  CBC - Abnormal; Notable for the following:    MCHC 36.3 (*)    All other components within normal limits  URINALYSIS, ROUTINE W REFLEX MICROSCOPIC (NOT AT [facility]) - Abnormal; Notable for the following:    APPearance CLOUDY (*)    All other components within normal limits  LIPASE, BLOOD    Imaging Review Ct Abdomen Pelvis W Contrast  12/01/2015  CLINICAL DATA:  35 year old male with lower abdominal pain  and vomiting EXAM: CT ABDOMEN AND PELVIS WITH CONTRAST TECHNIQUE: Multidetector CT imaging of the abdomen and pelvis was performed using the standard protocol following bolus administration of intravenous contrast. CONTRAST:  148mL ISOVUE-300 IOPAMIDOL (ISOVUE-300) INJECTION 61% COMPARISON:  Radiograph dated 04/01/2015 and CT dated 11/28/2014 FINDINGS: The visualized lung bases are clear. No intra-abdominal free air or free fluid. Cholecystectomy. The liver, pancreas, spleen, and the adrenal glands appear unremarkable. There are stable two nonobstructing left renal interpolar calculi measuring up to 5 mm. There is no hydronephrosis. The right kidney appears unremarkable. The visualized ureters, and urinary bladder, the prostate gland, and the seminal vesicles are grossly unremarkable. There is no evidence of bowel obstruction or active inflammation. Normal appendix. The abdominal aorta and IVC appear unremarkable. No portal venous gas identified. There is no  adenopathy. Midline vertical anterior abdominal wall incisional scar. Small fat containing umbilical hernia. The abdominal wall soft tissues are otherwise unremarkable. The osseous structures are intact. IMPRESSION: No acute intra-abdominal or pelvic pathology. Small nonobstructing left renal calculi, similar to prior study. No hydronephrosis. Electronically Signed   By: Anner Crete M.D.   On: 12/01/2015 22:22   I have personally reviewed and evaluated these images and lab results as part of my medical decision-making.   9:46 PM Patient seen and examined. Work-up initiated. Medications ordered. Will proceed with CT given unexplained symptoms for 3 months. He will likely require GI f/u. Labs reassuring.   Vital signs reviewed and are as follows: BP 124/85 mmHg  Pulse 87  Temp(Src) 99.2 F (37.3 C) (Oral)  Resp 16  SpO2 96%  11:04 PM CT neg. Pt informed. Patient continues to have vomiting. Tylenol given for HA. Reglan ordered. Will continue to attempt symptom control.   11:42 PM Informed by RN that patient has left ED without paperwork.    MDM   Final diagnoses:  Non-intractable vomiting with nausea, vomiting of unspecified type   Left AMA. Doubt serious etiology of symptoms. CT is reassuring.     Carlisle Cater, PA-C 12/02/15 1617  Davonna Belling, MD 12/03/15 0120

## 2015-12-01 NOTE — ED Notes (Signed)
Pt states that he has been throwing up everything he eats x 3 months. Also states that his voice goes in and out and he smells chlorine all the time. Alert and oriented. Seen at [facility] last month for the same.

## 2015-12-01 NOTE — ED Notes (Signed)
Pt transported to CT ?

## 2015-12-01 NOTE — ED Notes (Addendum)
Pt now complaining of a headache. 8/10. PA notified.

## 2016-02-13 ENCOUNTER — Emergency Department (HOSPITAL_COMMUNITY): Payer: Self-pay

## 2016-02-13 ENCOUNTER — Emergency Department (HOSPITAL_COMMUNITY)
Admission: EM | Admit: 2016-02-13 | Discharge: 2016-02-13 | Disposition: A | Discharge status: Home / Self Care | Payer: Self-pay | Attending: Emergency Medicine | Admitting: Emergency Medicine

## 2016-02-13 ENCOUNTER — Encounter (HOSPITAL_COMMUNITY): Payer: Self-pay | Admitting: *Deleted

## 2016-02-13 DIAGNOSIS — J449 Chronic obstructive pulmonary disease, unspecified: Secondary | ICD-10-CM | POA: Insufficient documentation

## 2016-02-13 DIAGNOSIS — R079 Chest pain, unspecified: Secondary | ICD-10-CM | POA: Insufficient documentation

## 2016-02-13 DIAGNOSIS — I10 Essential (primary) hypertension: Secondary | ICD-10-CM | POA: Insufficient documentation

## 2016-02-13 DIAGNOSIS — R0602 Shortness of breath: Secondary | ICD-10-CM | POA: Insufficient documentation

## 2016-02-13 DIAGNOSIS — F17210 Nicotine dependence, cigarettes, uncomplicated: Secondary | ICD-10-CM | POA: Insufficient documentation

## 2016-02-13 DIAGNOSIS — Z5321 Procedure and treatment not carried out due to patient leaving prior to being seen by health care provider: Secondary | ICD-10-CM | POA: Insufficient documentation

## 2016-02-13 DIAGNOSIS — Z79899 Other long term (current) drug therapy: Secondary | ICD-10-CM | POA: Insufficient documentation

## 2016-02-13 LAB — BASIC METABOLIC PANEL
Anion gap: 7 (ref 5–15)
BUN: 8 mg/dL (ref 6–20)
CHLORIDE: 104 mmol/L (ref 101–111)
CO2: 27 mmol/L (ref 22–32)
Calcium: 8.7 mg/dL — ABNORMAL LOW (ref 8.9–10.3)
Creatinine, Ser: 1.03 mg/dL (ref 0.61–1.24)
GFR calc non Af Amer: >60 mL/min (ref >60)
Glucose, Bld: 87 mg/dL (ref 65–99)
POTASSIUM: 3.4 mmol/L — AB (ref 3.5–5.1)
SODIUM: 138 mmol/L (ref 135–145)

## 2016-02-13 LAB — TROPONIN I: Troponin I: <0.03 ng/mL (ref <0.03)

## 2016-02-13 LAB — CBC
HEMATOCRIT: 47 % (ref 39.0–52.0)
HEMOGLOBIN: 16.1 g/dL (ref 13.0–17.0)
MCH: 29.8 pg (ref 26.0–34.0)
MCHC: 34.3 g/dL (ref 30.0–36.0)
MCV: 87 fL (ref 78.0–100.0)
Platelets: 252 10*3/uL (ref 150–400)
RBC: 5.4 MIL/uL (ref 4.22–5.81)
RDW: 12.2 % (ref 11.5–15.5)
WBC: 8.3 10*3/uL (ref 4.0–10.5)

## 2016-02-13 NOTE — ED Triage Notes (Signed)
The pt is c/o lt upper chest pain with some sob.  He also has a cold and a productive cough  Thick green sputum . Marland Kitchen The pt is still  A smoker hx copd

## 2016-03-12 IMAGING — MR MR MRA HEAD W/O CM
10 of 11 series · 31 of 48 positions shown · non-contrast
Comparison: CT of the head October 19, 2014

CLINICAL DATA: Chest pain, headache and LEFT arm numbness/pain
while driving today. History of hypertension, obesity, smoking,
schizophrenia, Bell's palsy.

EXAM:
MRI HEAD WITHOUT CONTRAST
MRA HEAD WITHOUT CONTRAST
TECHNIQUE: Multiplanar, multiecho pulse sequences of the brain and surrounding
structures were obtained without intravenous contrast. Angiographic
images of the head were obtained using MRA technique without
contrast.

[Series 4: DWI · axial · 3.0mm · 0.94mm/px · z∈[-56,+94]mm · 7 of 102 slices shown (1 of 4)]
[im 1/102]
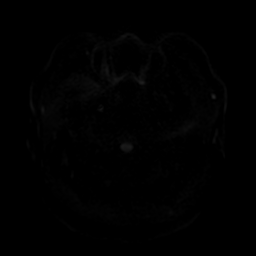
[im 17/102]
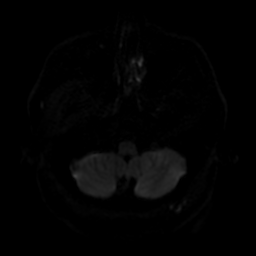
[im 34/102]
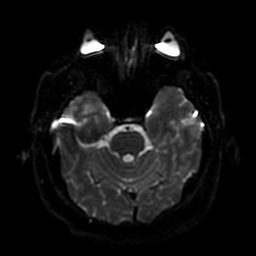
[im 51/102]
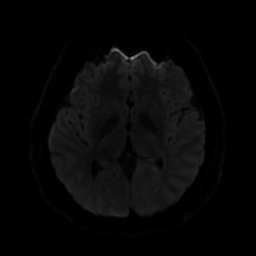
[im 68/102]
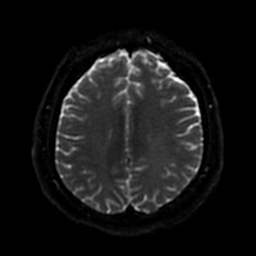
[im 85/102]
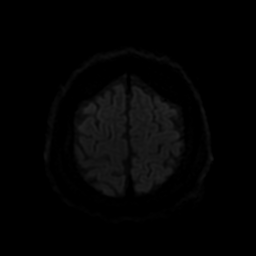
[im 102/102]
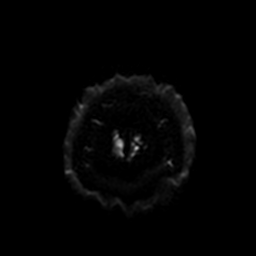

[Series 5: FLAIR · sagittal · 5.0mm · 0.49mm/px · 2 of 27 slices shown (1 of 2)]
[im 1/27]
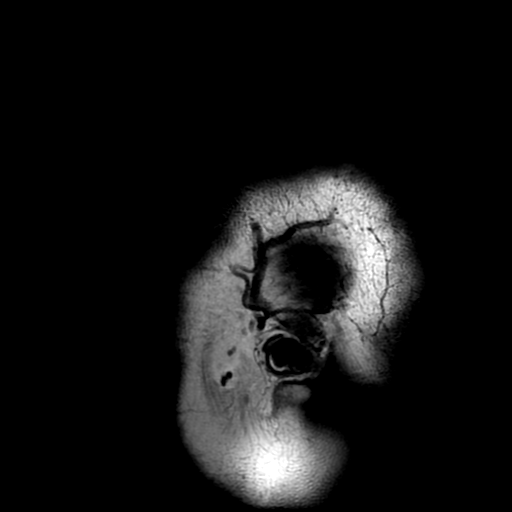
[im 27/27]
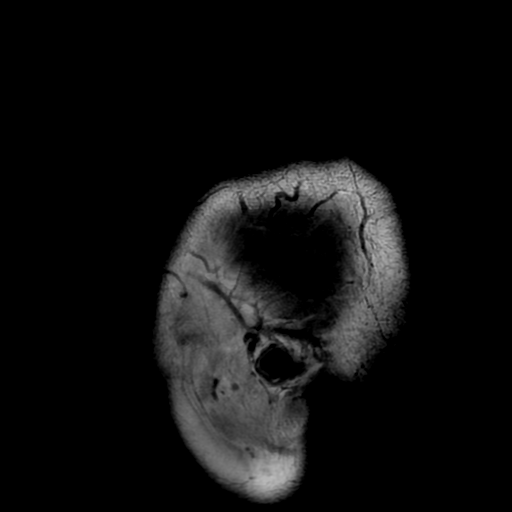

[Series 6: ax (id) 2 · axial · 1.2mm · 0.43mm/px · z∈[-72,-35]mm · 3 of 200 slices shown]
[im 1/200]
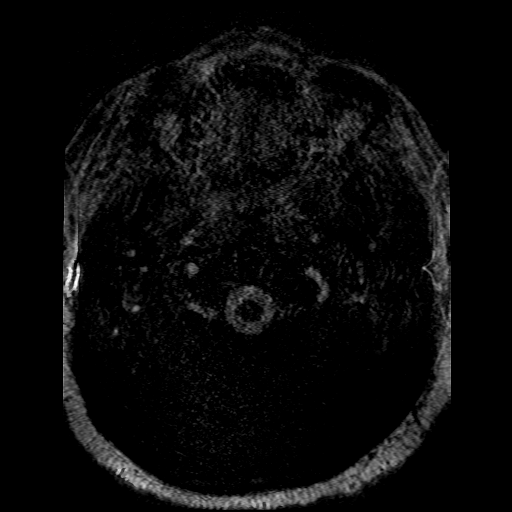
[im 31/200]
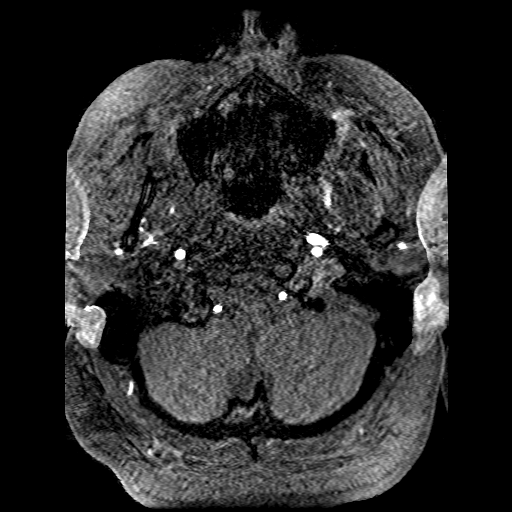
[im 62/200]
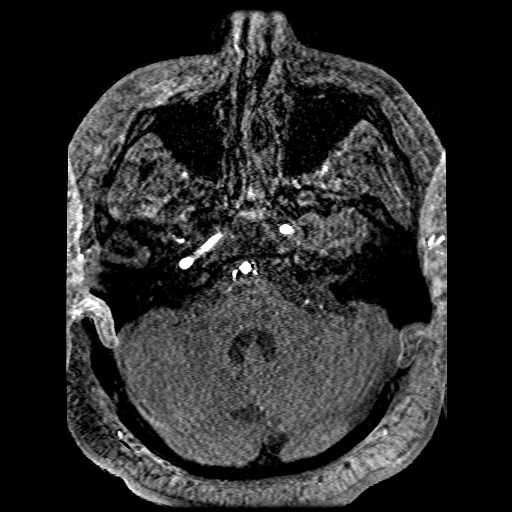

[Series 7: T2 · axial · 5.0mm · 0.47mm/px · z∈[-56,+94]mm · 2 of 26 slices shown (1 of 2)]
[im 1/26]
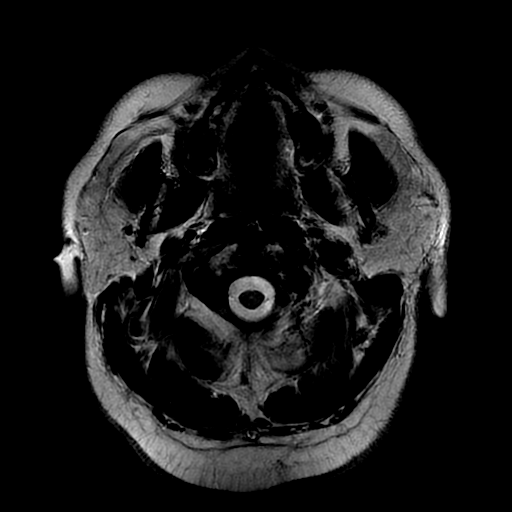
[im 26/26]
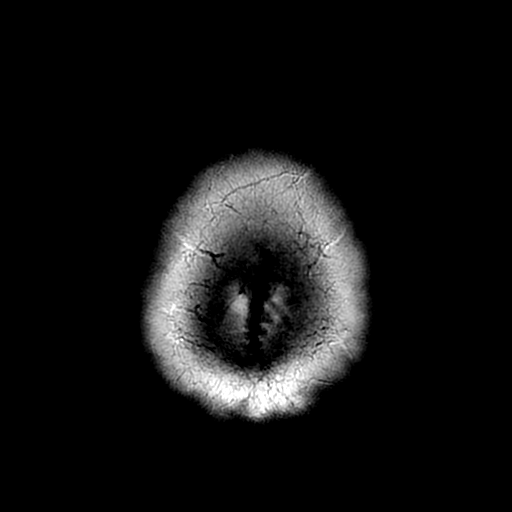

[Series 8: FLAIR · axial · 5.0mm · 0.47mm/px · z∈[-56,+94]mm · 2 of 26 slices shown (2 of 2)]
[im 1/26]
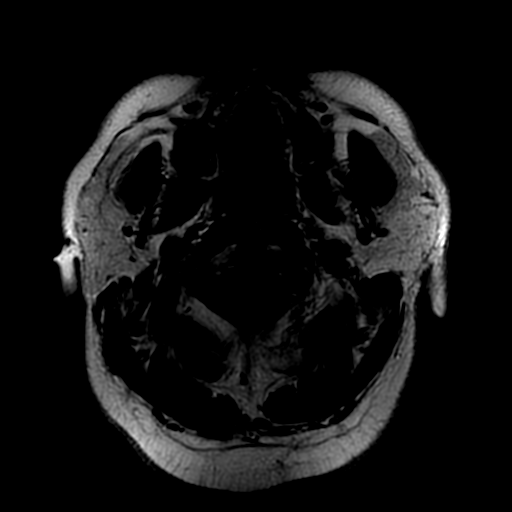
[im 26/26]
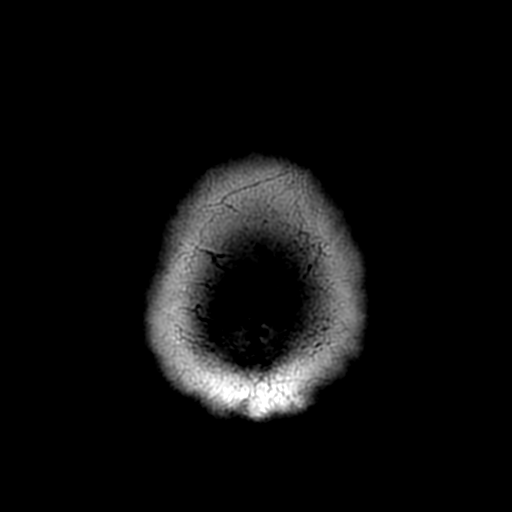

[Series 9: DWI · coronal · 5.0mm · 0.94mm/px · 5 of 68 slices shown (2 of 4)]
[im 1/68]
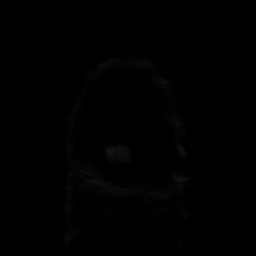
[im 17/68]
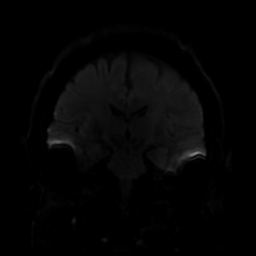
[im 34/68]
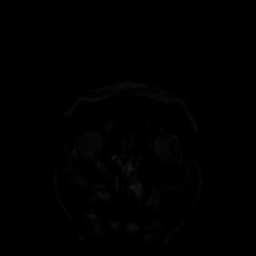
[im 51/68]
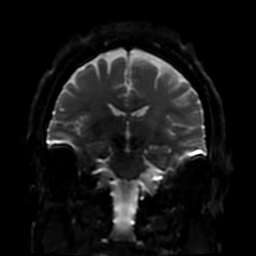
[im 68/68]
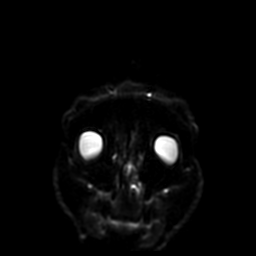

[Series 10: GRE · axial · 5.0mm · 0.47mm/px · z∈[-56,+94]mm · 2 of 26 slices shown]
[im 1/26]
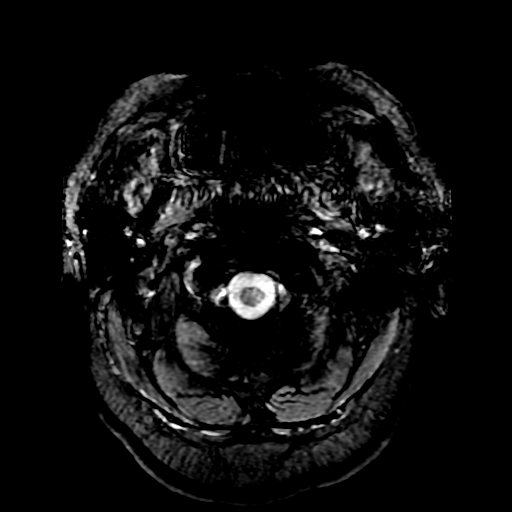
[im 26/26]
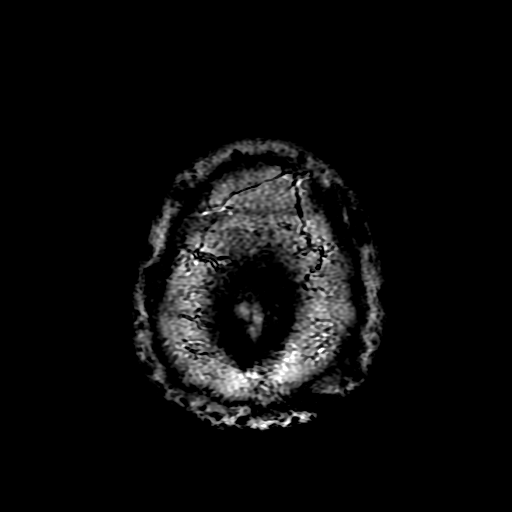

[Series 12: T2 · coronal · 5.0mm · 0.47mm/px · 2 of 29 slices shown (2 of 2)]
[im 1/29]
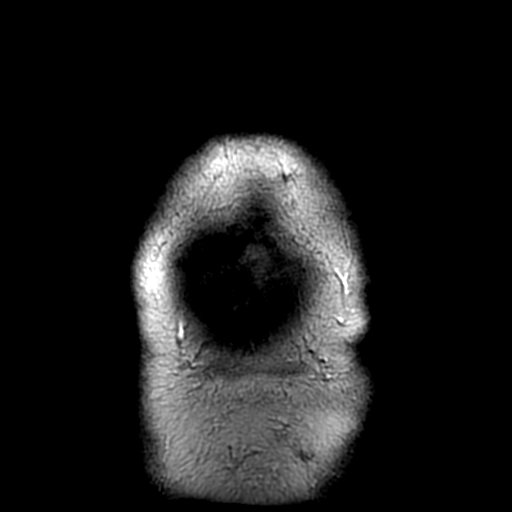
[im 29/29]
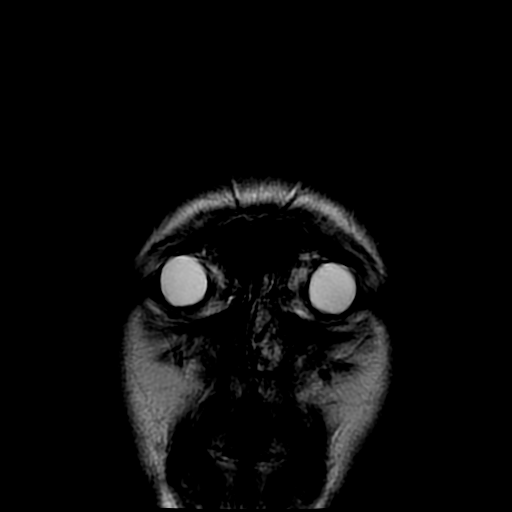

[Series 400: DWI · axial · 3.0mm · 0.94mm/px · z∈[-56,+94]mm · 4 of 51 slices shown (3 of 4)]
[im 1/51]
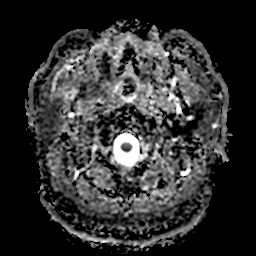
[im 17/51]
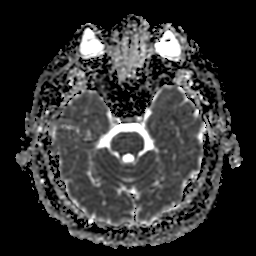
[im 34/51]
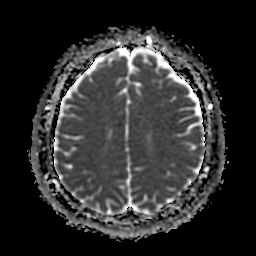
[im 51/51]
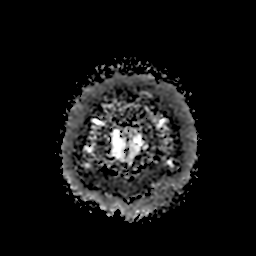

[Series 900: DWI · coronal · 5.0mm · 0.94mm/px · 2 of 33 slices shown (4 of 4)]
[im 1/33]
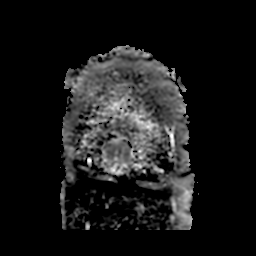
[im 33/33]
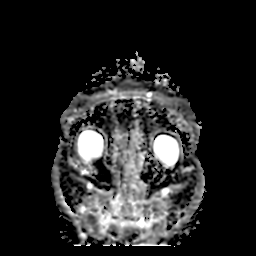

[31 of 48 positions shown; findings below may reference images not displayed]

FINDINGS: MRI HEAD FINDINGS

The ventricles and sulci are normal for patient's age. No abnormal
parenchymal signal, mass lesions, mass effect. No reduced diffusion
to suggest acute ischemia. No susceptibility artifact to suggest
hemorrhage. Susceptibility artifact along the LEFT cerebellar
tentorium at the incisura corresponding to calcification.

No abnormal extra-axial fluid collections. No extra-axial masses
though, contrast enhanced sequences would be more sensitive. Normal
major intracranial vascular flow voids seen at the skull base.

Ocular globes and orbital contents are unremarkable though not
tailored for evaluation. No abnormal sellar expansion. Visualized
paranasal sinuses and mastoid air cells are well-aerated. No
suspicious calvarial bone marrow signal. No abnormal sellar
expansion. Craniocervical junction maintained.

MRA HEAD FINDINGS

Anterior circulation: Normal flow related enhancement of the
included cervical, petrous, cavernous and supra clinoid internal
carotid arteries. Patent anterior communicating artery. Normal flow
related enhancement of the anterior and middle cerebral arteries,
including more distal segments.

No large vessel occlusion, high-grade stenosis, abnormal luminal
irregularity, aneurysm.

Posterior circulation: Codominant vertebral artery's. Basilar artery
is patent, with normal flow related enhancement of the main branch
vessels. Normal flow related enhancement of the posterior cerebral
arteries.

No large vessel occlusion, high-grade stenosis, abnormal luminal
irregularity, aneurysm.
IMPRESSION: MRI HEAD: Normal noncontrast MRI of the head.

MRA HEAD: Normal noncontrast MRA of the head.

  By: Akd More

## 2016-04-30 ENCOUNTER — Emergency Department (HOSPITAL_COMMUNITY)
Admission: EM | Admit: 2016-04-30 | Discharge: 2016-05-01 | Disposition: A | Discharge status: Home / Self Care | Payer: Medicare Other | Attending: Emergency Medicine | Admitting: Emergency Medicine

## 2016-04-30 ENCOUNTER — Emergency Department (HOSPITAL_COMMUNITY): Payer: Medicare Other

## 2016-04-30 ENCOUNTER — Encounter (HOSPITAL_COMMUNITY): Payer: Self-pay | Admitting: Nurse Practitioner

## 2016-04-30 DIAGNOSIS — Z8673 Personal history of transient ischemic attack (TIA), and cerebral infarction without residual deficits: Secondary | ICD-10-CM | POA: Insufficient documentation

## 2016-04-30 DIAGNOSIS — R1084 Generalized abdominal pain: Secondary | ICD-10-CM | POA: Diagnosis not present

## 2016-04-30 DIAGNOSIS — I10 Essential (primary) hypertension: Secondary | ICD-10-CM | POA: Diagnosis not present

## 2016-04-30 DIAGNOSIS — R0602 Shortness of breath: Secondary | ICD-10-CM | POA: Diagnosis present

## 2016-04-30 DIAGNOSIS — Z91040 Latex allergy status: Secondary | ICD-10-CM | POA: Insufficient documentation

## 2016-04-30 DIAGNOSIS — I2510 Atherosclerotic heart disease of native coronary artery without angina pectoris: Secondary | ICD-10-CM | POA: Insufficient documentation

## 2016-04-30 DIAGNOSIS — Z79899 Other long term (current) drug therapy: Secondary | ICD-10-CM | POA: Insufficient documentation

## 2016-04-30 DIAGNOSIS — J441 Chronic obstructive pulmonary disease with (acute) exacerbation: Secondary | ICD-10-CM | POA: Diagnosis not present

## 2016-04-30 DIAGNOSIS — F17210 Nicotine dependence, cigarettes, uncomplicated: Secondary | ICD-10-CM | POA: Insufficient documentation

## 2016-04-30 MED ORDER — IPRATROPIUM BROMIDE 0.02 % IN SOLN
0.5000 mg | Freq: Once | RESPIRATORY_TRACT | Status: AC
  Administered 2016-05-01: 0.5 mg via RESPIRATORY_TRACT
  Filled 2016-04-30: qty 2.5

## 2016-04-30 MED ORDER — SODIUM CHLORIDE 0.9 % IV BOLUS (SEPSIS)
1000.0000 mL | Freq: Once | INTRAVENOUS | Status: AC
  Administered 2016-05-01: 1000 mL via INTRAVENOUS

## 2016-04-30 MED ORDER — IOPAMIDOL (ISOVUE-370) INJECTION 76%
100.0000 mL | Freq: Once | INTRAVENOUS | Status: AC | PRN
  Administered 2016-05-01: 100 mL via INTRAVENOUS

## 2016-04-30 MED ORDER — ALBUTEROL (5 MG/ML) CONTINUOUS INHALATION SOLN
10.0000 mg/h | INHALATION_SOLUTION | Freq: Once | RESPIRATORY_TRACT | Status: AC
  Administered 2016-05-01: 10 mg/h via RESPIRATORY_TRACT
  Filled 2016-04-30: qty 20

## 2016-04-30 MED ORDER — ONDANSETRON HCL 4 MG/2ML IJ SOLN
4.0000 mg | Freq: Once | INTRAMUSCULAR | Status: AC
  Administered 2016-05-01: 4 mg via INTRAVENOUS
  Filled 2016-04-30: qty 2

## 2016-04-30 MED ORDER — HYDROMORPHONE HCL 1 MG/ML IJ SOLN
1.0000 mg | Freq: Once | INTRAMUSCULAR | Status: AC
Start: 2016-04-30 — End: 2016-05-01
  Administered 2016-05-01: 1 mg via INTRAVENOUS
  Filled 2016-04-30: qty 1

## 2016-04-30 MED ORDER — SODIUM CHLORIDE 0.9 % IV SOLN
INTRAVENOUS | Status: DC
  Administered 2016-05-01: 125 mL/h via INTRAVENOUS

## 2016-04-30 NOTE — ED Triage Notes (Signed)
Pt reporting shortness of breath, N/V and generalized body aches for the last 2-3 days. Seemingly lethargic, reports hx of COPD.

## 2016-04-30 NOTE — ED Notes (Signed)
Called respiratory to bedside. 

## 2016-04-30 NOTE — ED Provider Notes (Signed)
[facility] DEPT Provider Note   CSN: [id number] Arrival date & time: 04/30/16  2251  By signing my name below, I, Maud Deed. Royston Sinner, attest that this documentation has been prepared under the direction and in the presence of Varney Biles, MD.  Electronically Signed: Maud Deed. Royston Sinner, ED Scribe. 04/30/16. 11:30 PM.    History   Chief Complaint Chief Complaint  Patient presents with  . Shortness of Breath   The history is provided by the patient. No language interpreter was used.    HPI Comments: Nathaniel [name] is a 35 y.o. male with a PMHx of asthma, stroke with cerebral ischemia, HTN and COPD who presents to the Emergency Department complaining of constant, worsening shortness of breath onset earlier today. Shortness of breath is made worse with exertion without any alleviating factors at this time. Pt states "its hard for me to get good air in me". Pt also reports non-radiating chest discomfort, nausea, diarrhea, vomiting, dizziness, and constant stabbing diffuse abdominal pain that radiates to the back. He reports approximately more than 5 bowel movements a day. States some stools consist of "dark areas and yellow tints". However, no bright red blood noted in stools.5-10 episodes of vomiting reported daily consisting of dark green and whitish emesis. No OTC/prescribed medications attempted at home. No recent fever or chills. Pt smokes 1 pack of cigarettes a day but states he has cut back significantly as he was previously smoking 2.5 packs daily which began when he was 35 years old. He admits at one point he was smoking 3 pack daily for approximately 8 years.  PCP: Maren Reamer, MD    Past Medical History:  Diagnosis Date  . Asthma   . Bell's palsy   . Bipolar 1 disorder ([place])   . Carotid artery disease ([place])   . COPD (chronic obstructive pulmonary disease) ([place])   . Heavy smoker   . Hypertension   . IBS (irritable bowel syndrome)   . Schizophrenia [facility])     Patient  Active Problem List   Diagnosis Date Noted  . Morbid obesity ([place]) 03/17/2015  . OSA (obstructive sleep apnea) 12/08/2014  . Smoking 11/04/2014  . Stroke with cerebral ischemia ([place])   . Complicated migraine [id number]  . Stroke-like episode ([place])   . Headache 10/19/2014  . Bipolar disorder ([place]) 03/25/2014  . Chest pain 01/17/2014  . COPD (chronic obstructive pulmonary disease) ([place]) 01/17/2014  . Septic prepatellar bursitis of left knee 02/05/2013  . HTN (hypertension) 02/05/2013  . Hyperglycemia 02/05/2013  . Tobacco abuse 07/19/2012    Past Surgical History:  Procedure Laterality Date  . ABDOMINAL SURGERY    . CHOLECYSTECTOMY    . INCISION AND DRAINAGE Left 02/05/2013   Dr Rolena Infante  . IRRIGATION AND DEBRIDEMENT KNEE  02/05/2013   Procedure: IRRIGATION AND DEBRIDEMENT KNEE patella bursa;  Surgeon: Melina Schools, MD;  Location: [facility];  Service: Orthopedics;;  . KNEE ARTHROSCOPY WITH MEDIAL MENISECTOMY Left 02/05/2013   Procedure: KNEE ARTHROSCOPY  ;  Surgeon: Melina Schools, MD;  Location: [facility];  Service: Orthopedics;  Laterality: Left;       Home Medications    Prior to Admission medications   Medication Sig Start Date End Date Taking? Authorizing Provider  Brexpiprazole (REXULTI) 0.5 MG TABS Take 1 tablet by mouth 2 (two) times daily.   Yes Historical Provider, MD  carbamazepine (TEGRETOL) 200 MG tablet Take 200 mg by mouth 2 (two) times daily.   Yes Historical Provider, MD  gabapentin (NEURONTIN)  100 MG capsule Take 1 capsule (100 mg total) by mouth 3 (three) times daily. 09/29/15  Yes Maren Reamer, MD  omeprazole (PRILOSEC) 40 MG capsule Take 1 capsule (40 mg total) by mouth daily. 06/04/14  Yes Irene Shipper, MD  simvastatin (ZOCOR) 40 MG tablet Take 1 tablet (40 mg total) by mouth at bedtime. 09/30/15  Yes Dawn Lazarus Gowda, MD  albuterol (PROVENTIL HFA;VENTOLIN HFA) 108 (90 Base) MCG/ACT inhaler Inhale 2 puffs into the lungs every 6 (six) hours as needed for wheezing or  shortness of breath. 05/01/16   Varney Biles, MD  doxycycline (VIBRAMYCIN) 100 MG capsule Take 1 capsule (100 mg total) by mouth 2 (two) times daily. 05/01/16   Varney Biles, MD  nicotine (NICODERM CQ) 14 mg/24hr patch Place 1 patch (14 mg total) onto the skin daily. Patient not taking: Reported on 05/01/2016 09/29/15   Maren Reamer, MD  predniSONE (DELTASONE) 10 MG tablet Take 6 tablets (60 mg total) by mouth daily. 05/01/16   Varney Biles, MD  tiotropium (SPIRIVA) 18 MCG inhalation capsule Place 1 capsule (18 mcg total) into inhaler and inhale daily. Patient not taking: Reported on 05/01/2016 09/29/15   Maren Reamer, MD  Vitamin D, Ergocalciferol, (DRISDOL) 50000 units CAPS capsule Take 1 capsule (50,000 Units total) by mouth every 7 (seven) days. Patient not taking: Reported on 05/01/2016 09/30/15   Maren Reamer, MD    Family History Family History  Problem Relation Age of Onset  . Hypertension Mother   . CAD Father     MI in his late 48s  . Stroke Father   . Prostate cancer Father   . Diabetes Paternal Grandfather   . Pancreatic cancer Maternal Grandmother   . Clotting disorder Maternal Grandfather   . Heart disease Maternal Grandfather     Social History Social History  Substance Use Topics  . Smoking status: Current Every Day Smoker    Packs/day: 0.00    Years: 14.00    Types: Cigarettes  . Smokeless tobacco: Never Used  . Alcohol use No     Allergies   Flagyl [metronidazole]; Lithium; Penicillins; Phenergan [promethazine hcl]; Vancomycin; and Latex   Review of Systems Review of Systems  Constitutional: Negative for chills and fever.  Respiratory: Positive for cough and shortness of breath.   Cardiovascular: Positive for chest pain.  Gastrointestinal: Positive for abdominal pain, diarrhea, nausea and vomiting.  Musculoskeletal: Positive for back pain.  Neurological: Positive for dizziness.  All other systems reviewed and are  negative.    Physical Exam Updated Vital Signs BP 119/74   Pulse 103   Temp 98.1 F (36.7 C) (Oral)   Resp 19   Ht 6\' 1"  (1.854 m)   Wt 245 lb (111.1 kg)   SpO2 95%   BMI 32.32 kg/m   Physical Exam  Constitutional: He is oriented to person, place, and time. He appears well-developed and well-nourished.  HENT:  Head: Normocephalic and atraumatic.  Mouth/Throat: Oropharynx is clear and moist.  Eyes: EOM are normal.  Neck: Normal range of motion.  Cardiovascular: Normal rate, regular rhythm, normal heart sounds and intact distal pulses.   Pulmonary/Chest: Effort normal and breath sounds normal. No respiratory distress. He has no wheezes.  Air movement appears to be tight  Abdominal: Soft. He exhibits no distension. There is tenderness. There is rebound.  Positive bowel sounds. Generalized tenderness over abdomen  Musculoskeletal: Normal range of motion.  Neurological: He is alert and oriented to person, place,  and time.  Skin: Skin is warm and dry.  Psychiatric: He has a normal mood and affect. Judgment normal.  Nursing note and vitals reviewed.    ED Treatments / Results   DIAGNOSTIC STUDIES: Oxygen Saturation is 96% on RA, adequate by my interpretation.    COORDINATION OF CARE: 11:27 PM- Will order blood work, EKG, and imaging. Will give breathing treatment, fluids, Dilaudid and Zofran. Discussed treatment plan with pt at bedside and pt agreed to plan.     Labs (all labs ordered are listed, but only abnormal results are displayed) Labs Reviewed  COMPREHENSIVE METABOLIC PANEL - Abnormal; Notable for the following:       Result Value   Potassium 3.2 (*)    All other components within normal limits  I-STAT CHEM 8, ED - Abnormal; Notable for the following:    Potassium 3.2 (*)    Chloride 99 (*)    All other components within normal limits  CBC WITH DIFFERENTIAL/PLATELET  URINALYSIS, ROUTINE W REFLEX MICROSCOPIC (NOT AT [facility])  PROTIME-INR  TROPONIN I  I-STAT CG4  LACTIC ACID, ED  TYPE AND SCREEN  ABO/RH    EKG  EKG Interpretation  Date/Time:  Saturday April 30 2016 22:59:25 EDT Ventricular Rate:  100 PR Interval:    QRS Duration: 102 QT Interval:  335 QTC Calculation: 432 R Axis:   37 Text Interpretation:  Sinus tachycardia RSR' in V1 or V2, right VCD or RVH since last tracing no significant change Confirmed by Eulis Foster  MD, ELLIOTT [phone]) on 04/30/2016 11:51:00 PM       Radiology Dg Chest 2 View  Result Date: 05/01/2016 CLINICAL DATA:  35 y/o M; mid sternal chest pain, productive cough, and shortness of breath for 3 days. EXAM: CHEST  2 VIEW COMPARISON:  02/13/2016 chest radiograph FINDINGS: The heart size and mediastinal contours are within normal limits and stable. Both lungs are clear. Degenerative changes of thoracic spine. IMPRESSION: No active cardiopulmonary disease. Electronically Signed   By: Kristine Garbe M.D.   On: 05/01/2016 00:27   Ct Angio Chest/abd/pel For Dissection W And/or W/wo  Result Date: 05/01/2016 CLINICAL DATA:  Chest pain. Difficulty breathing. Abdominal pain radiating to the back. Concern for dissection or mesenteric ischemia. Extensive smoking history. EXAM: CT ANGIOGRAPHY CHEST, ABDOMEN AND PELVIS TECHNIQUE: Multidetector CT imaging through the chest, abdomen and pelvis was performed using the standard protocol during bolus administration of intravenous contrast. Multiplanar reconstructed images and MIPs were obtained and reviewed to evaluate the vascular anatomy. CONTRAST:  100 cc Isovue 370 IV COMPARISON:  Chest radiograph earlier this day. CT abdomen/ pelvis 04/06/2016, multiple priors. CT chest 04/24/2015. FINDINGS: CTA CHEST FINDINGS Cardiovascular: Normal caliber aorta without dissection, aneurysm, or significant atherosclerosis. No aortic hematoma. Left vertebral artery arises directly from the aorta, a normal variant. No central pulmonary embolus. Normal heart size. Mediastinum/Nodes: No pericardial  fluid. Prominent right hilar lymph nodes. Small mediastinal lymph nodes. No axillary adenopathy. Right thyroid nodule measures 2.6 cm. Left thyroid nodule measures 1.8 cm. Lungs/Pleura: Bronchial thickening. No consolidation. Minimal atelectasis in the right lower lobe. No pulmonary nodule. No pleural fluid. Musculoskeletal: There are no acute or suspicious osseous abnormalities. Degenerative spurring in the thoracic spine. Review of the MIP images confirms the above findings. CTA ABDOMEN AND PELVIS FINDINGS VASCULAR Aorta: Normal in caliber. No dissection or aneurysm. No significant atherosclerosis. Celiac: Patent, no stenosis or atherosclerosis. SMA: Patent, no stenosis or atherosclerosis. No evidence of embolic disease or significant narrowing. Renals: Small accessory lower  pole right renal artery. Single left renal artery. Main renal arteries are patent. IMA: Patent. Inflow: Normal in caliber without luminal narrowing or significant atherosclerosis. Veins: No suspicious abnormality identified on arterial imaging. Review of the MIP images confirms the above findings. NON-VASCULAR Hepatobiliary: No focal liver abnormality is seen. Status post cholecystectomy. No biliary dilatation. Pancreas: No ductal dilatation or inflammation. Spleen: Normal in size without focal abnormality. Adrenals/Urinary Tract: Adrenal glands are unremarkable. Symmetric renal enhancement without hydronephrosis. Bladder is unremarkable. Nonobstructing left renal stone. Stomach/Bowel: Stomach is within normal limits. Appendix appears normal. No evidence of bowel wall thickening, distention, or acute inflammatory changes. Scattered submucosal fatty infiltration about the sigmoid colon can be seen with chronic inflammation. No bowel wall thickening. Lymphatic: No adenopathy. Reproductive: Prostatic calcifications. Other: No free air or free fluid. Postsurgical change in the anterior abdominal wall. Musculoskeletal: There are no acute or  suspicious osseous abnormalities. Review of the MIP images confirms the above findings. IMPRESSION: 1. Normal caliber thoracoabdominal aorta without dissection. No significant atherosclerosis. 2. No evidence of mesenteric ischemia. 3. No acute abnormality in the chest, abdomen, or pelvis. 4. Chronic findings include bronchial thickening, nonobstructing left nephrolithiasis, and submucosal fatty infiltration in the distal colon. 5. Bilateral thyroid nodules, larger on the right measuring 2.6 cm. Recommend nonemergent thyroid ultrasound for characterization. Electronically Signed   By: Jeb Levering M.D.   On: 05/01/2016 02:10    Procedures Procedures (including critical care time) CRITICAL CARE Performed by: Varney Biles   Total critical care time: 45 minutes  Critical care time was exclusive of separately billable procedures and treating other patients.  Critical care was necessary to treat or prevent imminent or life-threatening deterioration.  Critical care was time spent personally by me on the following activities: development of treatment plan with patient and/or surrogate as well as nursing, discussions with consultants, evaluation of patient's response to treatment, examination of patient, obtaining history from patient or surrogate, ordering and performing treatments and interventions, ordering and review of laboratory studies, ordering and review of radiographic studies, pulse oximetry and re-evaluation of patient's condition.   Medications Ordered in ED Medications  sodium chloride 0.9 % bolus 1,000 mL (0 mLs Intravenous Stopped 05/01/16 0232)    And  0.9 %  sodium chloride infusion ( Intravenous Stopped 05/01/16 0636)  albuterol (PROVENTIL HFA;VENTOLIN HFA) 108 (90 Base) MCG/ACT inhaler 2 puff (not administered)  doxycycline (VIBRA-TABS) tablet 100 mg (not administered)  HYDROmorphone (DILAUDID) injection 1 mg (1 mg Intravenous Given 05/01/16 0019)  ondansetron (ZOFRAN)  injection 4 mg (4 mg Intravenous Given 05/01/16 0020)  albuterol (PROVENTIL,VENTOLIN) solution continuous neb (10 mg/hr Nebulization Given 05/01/16 0004)  ipratropium (ATROVENT) nebulizer solution 0.5 mg (0.5 mg Nebulization Given 05/01/16 0003)  iopamidol (ISOVUE-370) 76 % injection 100 mL (100 mLs Intravenous Contrast Given 05/01/16 0115)  methylPREDNISolone sodium succinate (SOLU-MEDROL) 125 mg/2 mL injection 125 mg (125 mg Intravenous Given 05/01/16 0343)  albuterol (PROVENTIL,VENTOLIN) solution continuous neb (10 mg/hr Nebulization Given 05/01/16 0358)  morphine 4 MG/ML injection 6 mg (6 mg Intravenous Given 05/01/16 0346)     Initial Impression / Assessment and Plan / ED Course  I have reviewed the triage vital signs and the nursing notes.  Pertinent labs & imaging results that were available during my care of the patient were reviewed by me and considered in my medical decision making (see chart for details).  Clinical Course  Comment By Time  Repeat exam reveals clearing of wheezing in all lung fields. Patient is not in  any respiratory distress nor is there hypoxia. Ambulatory pulsox checked.  Results from the ER workup discussed with the patient face to face and all questions answered to the best of my ability.  Strict ER return precautions have been discussed, and patient is agreeing with the plan and is comfortable with the workup done and the recommendations from the ER.   Varney Biles, MD 10/15 [phone]      Final Clinical Impressions(s) / ED Diagnoses   Final diagnoses:  COPD exacerbation ([place])   Pt comes in with cc of dib, chest discomfort, abd pain, back pain. Hx of heavy smoking (3 ppd for years, then 2 ppd for years, now 1.5 ppd). CT dissection ordered. Pt appears uncomfortable, and symptoms got worse all of a sudden. Will r/o dissection, perforations, intra-abd infection, Pneumonia. Also has tight lung sounds. We will start nebs.    New Prescriptions New  Prescriptions   ALBUTEROL (PROVENTIL HFA;VENTOLIN HFA) 108 (90 BASE) MCG/ACT INHALER    Inhale 2 puffs into the lungs every 6 (six) hours as needed for wheezing or shortness of breath.   DOXYCYCLINE (VIBRAMYCIN) 100 MG CAPSULE    Take 1 capsule (100 mg total) by mouth 2 (two) times daily.   PREDNISONE (DELTASONE) 10 MG TABLET    Take 6 tablets (60 mg total) by mouth daily.  I personally performed the services described in this documentation, which was scribed in my presence. The recorded information has been reviewed and is accurate.    Varney Biles, MD 05/01/16 [phone]

## 2016-05-01 ENCOUNTER — Encounter (HOSPITAL_COMMUNITY): Payer: Self-pay | Admitting: Emergency Medicine

## 2016-05-01 ENCOUNTER — Emergency Department (HOSPITAL_COMMUNITY)
Admission: EM | Admit: 2016-05-01 | Discharge: 2016-05-01 | Disposition: A | Discharge status: Home / Self Care | Payer: Medicare Other | Source: Home / Self Care

## 2016-05-01 DIAGNOSIS — Z5321 Procedure and treatment not carried out due to patient leaving prior to being seen by health care provider: Secondary | ICD-10-CM | POA: Insufficient documentation

## 2016-05-01 DIAGNOSIS — J441 Chronic obstructive pulmonary disease with (acute) exacerbation: Secondary | ICD-10-CM | POA: Diagnosis not present

## 2016-05-01 DIAGNOSIS — F17210 Nicotine dependence, cigarettes, uncomplicated: Secondary | ICD-10-CM

## 2016-05-01 DIAGNOSIS — I10 Essential (primary) hypertension: Secondary | ICD-10-CM

## 2016-05-01 DIAGNOSIS — R0602 Shortness of breath: Secondary | ICD-10-CM | POA: Insufficient documentation

## 2016-05-01 DIAGNOSIS — J449 Chronic obstructive pulmonary disease, unspecified: Secondary | ICD-10-CM

## 2016-05-01 LAB — COMPREHENSIVE METABOLIC PANEL
ALBUMIN: 4 g/dL (ref 3.5–5.0)
ALK PHOS: 87 U/L (ref 38–126)
ALT: 28 U/L (ref 17–63)
AST: 24 U/L (ref 15–41)
Anion gap: 6 (ref 5–15)
BUN: 9 mg/dL (ref 6–20)
CHLORIDE: 104 mmol/L (ref 101–111)
CO2: 26 mmol/L (ref 22–32)
CREATININE: 1.01 mg/dL (ref 0.61–1.24)
Calcium: 8.9 mg/dL (ref 8.9–10.3)
GFR calc non Af Amer: >60 mL/min (ref >60)
GLUCOSE: 91 mg/dL (ref 65–99)
Potassium: 3.2 mmol/L — ABNORMAL LOW (ref 3.5–5.1)
SODIUM: 136 mmol/L (ref 135–145)
Total Bilirubin: 0.8 mg/dL (ref 0.3–1.2)
Total Protein: 7.2 g/dL (ref 6.5–8.1)

## 2016-05-01 LAB — TROPONIN I

## 2016-05-01 LAB — I-STAT CHEM 8, ED
BUN: 7 mg/dL (ref 6–20)
CREATININE: 1 mg/dL (ref 0.61–1.24)
Calcium, Ion: 1.15 mmol/L (ref 1.15–1.40)
Chloride: 99 mmol/L — ABNORMAL LOW (ref 101–111)
GLUCOSE: 93 mg/dL (ref 65–99)
HEMATOCRIT: 45 % (ref 39.0–52.0)
Hemoglobin: 15.3 g/dL (ref 13.0–17.0)
POTASSIUM: 3.2 mmol/L — AB (ref 3.5–5.1)
Sodium: 139 mmol/L (ref 135–145)
TCO2: 28 mmol/L (ref 0–100)

## 2016-05-01 LAB — URINALYSIS, ROUTINE W REFLEX MICROSCOPIC
BILIRUBIN URINE: NEGATIVE
GLUCOSE, UA: NEGATIVE mg/dL
HGB URINE DIPSTICK: NEGATIVE
Ketones, ur: NEGATIVE mg/dL
Leukocytes, UA: NEGATIVE
Nitrite: NEGATIVE
PH: 6.5 (ref 5.0–8.0)
Protein, ur: NEGATIVE mg/dL
SPECIFIC GRAVITY, URINE: 1.005 (ref 1.005–1.030)

## 2016-05-01 LAB — I-STAT CG4 LACTIC ACID, ED: Lactic Acid, Venous: 1.12 mmol/L (ref 0.5–1.9)

## 2016-05-01 LAB — BASIC METABOLIC PANEL
Anion gap: 8 (ref 5–15)
BUN: 7 mg/dL (ref 6–20)
CHLORIDE: 107 mmol/L (ref 101–111)
CO2: 23 mmol/L (ref 22–32)
Calcium: 9.3 mg/dL (ref 8.9–10.3)
Creatinine, Ser: 0.87 mg/dL (ref 0.61–1.24)
GFR calc Af Amer: >60 mL/min (ref >60)
GFR calc non Af Amer: >60 mL/min (ref >60)
GLUCOSE: 121 mg/dL — AB (ref 65–99)
POTASSIUM: 3.9 mmol/L (ref 3.5–5.1)
Sodium: 138 mmol/L (ref 135–145)

## 2016-05-01 LAB — TYPE AND SCREEN
ABO/RH(D): O POS
Antibody Screen: NEGATIVE

## 2016-05-01 LAB — CBC WITH DIFFERENTIAL/PLATELET
Basophils Absolute: 0 10*3/uL (ref 0.0–0.1)
Basophils Relative: 0 %
Eosinophils Absolute: 0.2 10*3/uL (ref 0.0–0.7)
Eosinophils Relative: 2 %
HEMATOCRIT: 42.8 % (ref 39.0–52.0)
HEMOGLOBIN: 15 g/dL (ref 13.0–17.0)
LYMPHS ABS: 2.3 10*3/uL (ref 0.7–4.0)
Lymphocytes Relative: 22 %
MCH: 29.9 pg (ref 26.0–34.0)
MCHC: 35 g/dL (ref 30.0–36.0)
MCV: 85.4 fL (ref 78.0–100.0)
MONO ABS: 0.8 10*3/uL (ref 0.1–1.0)
MONOS PCT: 7 %
NEUTROS ABS: 7.2 10*3/uL (ref 1.7–7.7)
NEUTROS PCT: 69 %
Platelets: 197 10*3/uL (ref 150–400)
RBC: 5.01 MIL/uL (ref 4.22–5.81)
RDW: 12.7 % (ref 11.5–15.5)
WBC: 10.5 10*3/uL (ref 4.0–10.5)

## 2016-05-01 LAB — CBC
HEMATOCRIT: 42.1 % (ref 39.0–52.0)
HEMOGLOBIN: 15.4 g/dL (ref 13.0–17.0)
MCH: 30.3 pg (ref 26.0–34.0)
MCHC: 36.6 g/dL — AB (ref 30.0–36.0)
MCV: 82.7 fL (ref 78.0–100.0)
Platelets: 254 10*3/uL (ref 150–400)
RBC: 5.09 MIL/uL (ref 4.22–5.81)
RDW: 12.5 % (ref 11.5–15.5)
WBC: 10.9 10*3/uL — AB (ref 4.0–10.5)

## 2016-05-01 LAB — PROTIME-INR
INR: 0.91
Prothrombin Time: 12.2 seconds (ref 11.4–15.2)

## 2016-05-01 LAB — ABO/RH: ABO/RH(D): O POS

## 2016-05-01 MED ORDER — ALBUTEROL SULFATE (2.5 MG/3ML) 0.083% IN NEBU
5.0000 mg | INHALATION_SOLUTION | Freq: Once | RESPIRATORY_TRACT | Status: AC
  Administered 2016-05-01: 5 mg via RESPIRATORY_TRACT
  Filled 2016-05-01: qty 6

## 2016-05-01 MED ORDER — ALBUTEROL SULFATE HFA 108 (90 BASE) MCG/ACT IN AERS
2.0000 | INHALATION_SPRAY | Freq: Once | RESPIRATORY_TRACT | Status: AC
  Administered 2016-05-01: 2 via RESPIRATORY_TRACT
  Filled 2016-05-01: qty 6.7

## 2016-05-01 MED ORDER — DOXYCYCLINE HYCLATE 100 MG PO CAPS: 100.0000 mg | ORAL_CAPSULE | Freq: Two times a day (BID) | ORAL | 0 refills | Status: AC

## 2016-05-01 MED ORDER — DOXYCYCLINE HYCLATE 100 MG PO TABS
100.0000 mg | ORAL_TABLET | Freq: Once | ORAL | Status: AC
  Administered 2016-05-01: 100 mg via ORAL
  Filled 2016-05-01: qty 1

## 2016-05-01 MED ORDER — METHYLPREDNISOLONE SODIUM SUCC 125 MG IJ SOLR
125.0000 mg | Freq: Once | INTRAMUSCULAR | Status: AC
Start: 2016-05-01 — End: 2016-05-01
  Administered 2016-05-01: 125 mg via INTRAVENOUS
  Filled 2016-05-01: qty 2

## 2016-05-01 MED ORDER — ALBUTEROL (5 MG/ML) CONTINUOUS INHALATION SOLN
10.0000 mg/h | INHALATION_SOLUTION | Freq: Once | RESPIRATORY_TRACT | Status: AC
  Administered 2016-05-01: 10 mg/h via RESPIRATORY_TRACT

## 2016-05-01 MED ORDER — ALBUTEROL SULFATE HFA 108 (90 BASE) MCG/ACT IN AERS: 2.0000 | INHALATION_SPRAY | Freq: Four times a day (QID) | RESPIRATORY_TRACT | 2 refills | Status: AC | PRN

## 2016-05-01 MED ORDER — MORPHINE SULFATE (PF) 4 MG/ML IV SOLN
6.0000 mg | Freq: Once | INTRAVENOUS | Status: AC
  Administered 2016-05-01: 6 mg via INTRAVENOUS
  Filled 2016-05-01: qty 2

## 2016-05-01 MED ORDER — PREDNISONE 10 MG PO TABS: 60.0000 mg | ORAL_TABLET | Freq: Every day | ORAL | 0 refills | Status: AC

## 2016-05-01 NOTE — Discharge Instructions (Signed)
We saw you in the ER for your breathing related complains. We gave you some breathing treatments in the ER, and seems like your symptoms have improved. °Please take albuterol as needed every 4 hours. °Please take the medications prescribed. °Please refrain from smoking or smoke exposure. °Please see a primary care doctor in 1 week. °Return to the ER if your symptoms worsen. ° °

## 2016-05-01 NOTE — ED Notes (Signed)
respiratory made aware of neb treatment ordered

## 2016-05-01 NOTE — ED Triage Notes (Signed)
Patient c/o SOB and productive cough with thick green sputum. patient was sen here last night for COPD exacerbation and sent home. patient isnt feeling any better.

## 2016-05-01 NOTE — ED Notes (Signed)
Patient states that he is feeling little better since neb treatment and since busy here in ED so he is going home

## 2016-05-01 NOTE — ED Notes (Signed)
Called Respiratory to bedside. 

## 2016-05-02 ENCOUNTER — Encounter (HOSPITAL_COMMUNITY): Payer: Self-pay | Admitting: *Deleted

## 2016-05-02 ENCOUNTER — Emergency Department (HOSPITAL_COMMUNITY)
Admission: EM | Admit: 2016-05-02 | Discharge: 2016-05-02 | Disposition: A | Discharge status: Home / Self Care | Payer: Medicare Other | Attending: Dermatology | Admitting: Dermatology

## 2016-05-02 DIAGNOSIS — R110 Nausea: Secondary | ICD-10-CM | POA: Diagnosis not present

## 2016-05-02 DIAGNOSIS — J45909 Unspecified asthma, uncomplicated: Secondary | ICD-10-CM | POA: Insufficient documentation

## 2016-05-02 DIAGNOSIS — J441 Chronic obstructive pulmonary disease with (acute) exacerbation: Secondary | ICD-10-CM | POA: Diagnosis not present

## 2016-05-02 DIAGNOSIS — Z5321 Procedure and treatment not carried out due to patient leaving prior to being seen by health care provider: Secondary | ICD-10-CM | POA: Insufficient documentation

## 2016-05-02 DIAGNOSIS — R52 Pain, unspecified: Secondary | ICD-10-CM | POA: Diagnosis not present

## 2016-05-02 DIAGNOSIS — Z79899 Other long term (current) drug therapy: Secondary | ICD-10-CM | POA: Insufficient documentation

## 2016-05-02 DIAGNOSIS — I10 Essential (primary) hypertension: Secondary | ICD-10-CM | POA: Insufficient documentation

## 2016-05-02 DIAGNOSIS — F17210 Nicotine dependence, cigarettes, uncomplicated: Secondary | ICD-10-CM | POA: Diagnosis not present

## 2016-05-02 DIAGNOSIS — R0602 Shortness of breath: Secondary | ICD-10-CM | POA: Insufficient documentation

## 2016-05-02 NOTE — ED Triage Notes (Signed)
Pt complains of shortness of breath, generalized body aches, nausea. Pt was seen 2 nights ago for COPD exacerbation. Pt received labs and breathing treatment at [facility] but was not seen by provider.

## 2016-12-01 ENCOUNTER — Encounter: Payer: Self-pay | Admitting: Internal Medicine

## 2017-03-03 ENCOUNTER — Other Ambulatory Visit (HOSPITAL_COMMUNITY): Payer: Self-pay | Admitting: Endocrinology

## 2017-03-03 DIAGNOSIS — C73 Malignant neoplasm of thyroid gland: Secondary | ICD-10-CM

## 2017-03-22 ENCOUNTER — Encounter (HOSPITAL_COMMUNITY)
Admission: RE | Admit: 2017-03-22 | Discharge: 2017-03-22 | Disposition: A | Discharge status: Home / Self Care | Payer: Medicare Other | Source: Ambulatory Visit | Attending: Endocrinology | Admitting: Endocrinology

## 2017-03-22 DIAGNOSIS — C73 Malignant neoplasm of thyroid gland: Secondary | ICD-10-CM | POA: Insufficient documentation

## 2017-03-22 MED ORDER — STERILE WATER FOR INJECTION IJ SOLN
INTRAMUSCULAR | Status: AC
  Filled 2017-03-22: qty 10

## 2017-03-22 MED ORDER — THYROTROPIN ALFA 1.1 MG IM SOLR
0.9000 mg | INTRAMUSCULAR | Status: AC
  Administered 2017-03-22: 0.9 mg via INTRAMUSCULAR

## 2017-03-23 ENCOUNTER — Encounter (HOSPITAL_COMMUNITY)
Admission: RE | Admit: 2017-03-23 | Discharge: 2017-03-23 | Disposition: A | Discharge status: Home / Self Care | Payer: Medicare Other | Source: Ambulatory Visit | Attending: Endocrinology | Admitting: Endocrinology

## 2017-03-23 DIAGNOSIS — C73 Malignant neoplasm of thyroid gland: Secondary | ICD-10-CM | POA: Diagnosis not present

## 2017-03-23 MED ORDER — THYROTROPIN ALFA 1.1 MG IM SOLR
0.9000 mg | INTRAMUSCULAR | Status: AC
  Administered 2017-03-23: 0.9 mg via INTRAMUSCULAR

## 2017-03-23 MED ORDER — STERILE WATER FOR INJECTION IJ SOLN
INTRAMUSCULAR | Status: AC
  Filled 2017-03-23: qty 10

## 2017-03-24 ENCOUNTER — Encounter (HOSPITAL_COMMUNITY)
Admission: RE | Admit: 2017-03-24 | Discharge: 2017-03-24 | Disposition: A | Discharge status: Home / Self Care | Payer: Medicare Other | Source: Ambulatory Visit | Attending: Endocrinology | Admitting: Endocrinology

## 2017-03-24 DIAGNOSIS — C73 Malignant neoplasm of thyroid gland: Secondary | ICD-10-CM | POA: Diagnosis not present

## 2017-03-24 MED ORDER — SODIUM IODIDE I 131 CAPSULE
125.0000 | Freq: Once | INTRAVENOUS | Status: AC | PRN
  Administered 2017-03-24: 125 via ORAL

## 2017-04-03 ENCOUNTER — Encounter (HOSPITAL_COMMUNITY)
Admission: RE | Admit: 2017-04-03 | Discharge: 2017-04-03 | Disposition: A | Discharge status: Home / Self Care | Payer: Medicare Other | Source: Ambulatory Visit | Attending: Endocrinology | Admitting: Endocrinology

## 2017-04-03 DIAGNOSIS — C73 Malignant neoplasm of thyroid gland: Secondary | ICD-10-CM

## 2018-01-17 ENCOUNTER — Emergency Department
Admission: EM | Admit: 2018-01-17 | Discharge: 2018-01-17 | Disposition: A | Discharge status: Home / Self Care | Payer: Medicare Other | Attending: Emergency Medicine | Admitting: Emergency Medicine

## 2018-01-17 DIAGNOSIS — Z5321 Procedure and treatment not carried out due to patient leaving prior to being seen by health care provider: Secondary | ICD-10-CM | POA: Diagnosis not present

## 2018-01-17 DIAGNOSIS — R21 Rash and other nonspecific skin eruption: Secondary | ICD-10-CM | POA: Diagnosis present

## 2018-01-17 NOTE — ED Triage Notes (Signed)
See paper chart for downtime documentation
